# Patient Record
Sex: Male | Born: 1980
Health system: Southern US, Community
[De-identification: ages and names within clinical notes are randomized; demographics above are authoritative.]

## PROBLEM LIST (undated history)

## (undated) ENCOUNTER — Emergency Department: Admission: EM | Payer: Medicare Other

## (undated) DIAGNOSIS — B009 Herpesviral infection, unspecified: Secondary | ICD-10-CM

## (undated) DIAGNOSIS — Z789 Other specified health status: Secondary | ICD-10-CM

## (undated) DIAGNOSIS — F319 Bipolar disorder, unspecified: Secondary | ICD-10-CM

## (undated) DIAGNOSIS — R739 Hyperglycemia, unspecified: Secondary | ICD-10-CM

## (undated) DIAGNOSIS — E119 Type 2 diabetes mellitus without complications: Secondary | ICD-10-CM

## (undated) HISTORY — DX: Herpesviral infection, unspecified: B00.9

## (undated) HISTORY — DX: Type 2 diabetes mellitus without complications: E11.9

## (undated) HISTORY — DX: Hyperglycemia, unspecified: R73.9

## (undated) HISTORY — PX: INCISION AND DRAINAGE ABSCESS: SHX5864

## (undated) HISTORY — PX: LEG SURGERY: SHX1003

---

## 2006-04-09 ENCOUNTER — Ambulatory Visit: Payer: Self-pay

## 2011-05-23 LAB — COMPREHENSIVE METABOLIC PANEL
Albumin: 4.3 g/dL (ref 3.4–5.0)
Alkaline Phosphatase: 80 U/L (ref 50–136)
Anion Gap: 13 (ref 7–16)
BUN: 13 mg/dL (ref 7–18)
Bilirubin,Total: 0.5 mg/dL (ref 0.2–1.0)
Calcium, Total: 9.2 mg/dL (ref 8.5–10.1)
Creatinine: 1.36 mg/dL — ABNORMAL HIGH (ref 0.60–1.30)
EGFR (African American): >60
Glucose: 142 mg/dL — ABNORMAL HIGH (ref 65–99)
Osmolality: 286 (ref 275–301)
Potassium: 3.6 mmol/L (ref 3.5–5.1)
SGOT(AST): 27 U/L (ref 15–37)
Total Protein: 8.4 g/dL — ABNORMAL HIGH (ref 6.4–8.2)

## 2011-05-23 LAB — DRUG SCREEN, URINE
Amphetamines, Ur Screen: NEGATIVE (ref ?–1000)
Benzodiazepine, Ur Scrn: NEGATIVE (ref ?–200)
MDMA (Ecstasy)Ur Screen: NEGATIVE (ref ?–500)
Methadone, Ur Screen: NEGATIVE (ref ?–300)
Opiate, Ur Screen: NEGATIVE (ref ?–300)
Phencyclidine (PCP) Ur S: NEGATIVE (ref ?–25)
Tricyclic, Ur Screen: NEGATIVE (ref ?–1000)

## 2011-05-23 LAB — CBC
HCT: 47.1 % (ref 40.0–52.0)
MCHC: 34.5 g/dL (ref 32.0–36.0)
MCV: 92 fL (ref 80–100)
RBC: 5.13 10*6/uL (ref 4.40–5.90)
RDW: 13.4 % (ref 11.5–14.5)
WBC: 8.4 10*3/uL (ref 3.8–10.6)

## 2011-05-23 LAB — ACETAMINOPHEN LEVEL: Acetaminophen: <2 ug/mL

## 2011-05-23 LAB — ETHANOL
Ethanol %: <0.003 % (ref 0.000–0.080)
Ethanol: <3 mg/dL

## 2011-05-23 LAB — TSH: Thyroid Stimulating Horm: 2.61 u[IU]/mL

## 2011-05-24 ENCOUNTER — Inpatient Hospital Stay: Payer: Self-pay | Admitting: Psychiatry

## 2011-06-01 LAB — VALPROIC ACID LEVEL: Valproic Acid: 61 ug/mL

## 2011-06-20 LAB — CBC
HCT: 46.4 % (ref 40.0–52.0)
Platelet: 252 10*3/uL (ref 150–440)
RBC: 5.11 10*6/uL (ref 4.40–5.90)
RDW: 12.5 % (ref 11.5–14.5)
WBC: 8.6 10*3/uL (ref 3.8–10.6)

## 2011-06-20 LAB — DRUG SCREEN, URINE
Amphetamines, Ur Screen: NEGATIVE (ref ?–1000)
Barbiturates, Ur Screen: NEGATIVE (ref ?–200)
Benzodiazepine, Ur Scrn: NEGATIVE (ref ?–200)
Cannabinoid 50 Ng, Ur ~~LOC~~: POSITIVE (ref ?–50)
MDMA (Ecstasy)Ur Screen: NEGATIVE (ref ?–500)
Methadone, Ur Screen: NEGATIVE (ref ?–300)
Opiate, Ur Screen: NEGATIVE (ref ?–300)

## 2011-06-20 LAB — COMPREHENSIVE METABOLIC PANEL
Albumin: 4.1 g/dL (ref 3.4–5.0)
Anion Gap: 9 (ref 7–16)
BUN: 13 mg/dL (ref 7–18)
Calcium, Total: 8.8 mg/dL (ref 8.5–10.1)
Co2: 29 mmol/L (ref 21–32)
Creatinine: 1.46 mg/dL — ABNORMAL HIGH (ref 0.60–1.30)
Osmolality: 282 (ref 275–301)
Sodium: 139 mmol/L (ref 136–145)
Total Protein: 8.5 g/dL — ABNORMAL HIGH (ref 6.4–8.2)

## 2011-06-20 LAB — TSH: Thyroid Stimulating Horm: 1.64 u[IU]/mL

## 2011-06-20 LAB — ETHANOL: Ethanol: <3 mg/dL

## 2011-06-20 LAB — SALICYLATE LEVEL: Salicylates, Serum: 2.3 mg/dL

## 2011-06-21 LAB — VALPROIC ACID LEVEL: Valproic Acid: 21 ug/mL — ABNORMAL LOW

## 2011-06-26 ENCOUNTER — Inpatient Hospital Stay: Payer: Self-pay | Admitting: Psychiatry

## 2011-06-26 LAB — VALPROIC ACID LEVEL: Valproic Acid: 70 ug/mL

## 2011-06-30 LAB — LIPID PANEL
HDL Cholesterol: 26 mg/dL — ABNORMAL LOW (ref 40–60)
Ldl Cholesterol, Calc: 77 mg/dL (ref 0–100)
Triglycerides: 250 mg/dL — ABNORMAL HIGH (ref 0–200)
VLDL Cholesterol, Calc: 50 mg/dL — ABNORMAL HIGH (ref 5–40)

## 2011-09-17 ENCOUNTER — Emergency Department: Payer: Self-pay | Admitting: Emergency Medicine

## 2011-09-17 LAB — COMPREHENSIVE METABOLIC PANEL
Albumin: 4.3 g/dL (ref 3.4–5.0)
Calcium, Total: 9.2 mg/dL (ref 8.5–10.1)
Chloride: 106 mmol/L (ref 98–107)
Co2: 24 mmol/L (ref 21–32)
Creatinine: 1.61 mg/dL — ABNORMAL HIGH (ref 0.60–1.30)
EGFR (Non-African Amer.): 57 — ABNORMAL LOW
Glucose: 170 mg/dL — ABNORMAL HIGH (ref 65–99)
Potassium: 3.8 mmol/L (ref 3.5–5.1)
SGOT(AST): 28 U/L (ref 15–37)
Total Protein: 8.5 g/dL — ABNORMAL HIGH (ref 6.4–8.2)

## 2011-09-17 LAB — CBC
MCH: 30.9 pg (ref 26.0–34.0)
MCHC: 34.1 g/dL (ref 32.0–36.0)
MCV: 91 fL (ref 80–100)
Platelet: 251 10*3/uL (ref 150–440)
RBC: 5.31 10*6/uL (ref 4.40–5.90)
WBC: 9.4 10*3/uL (ref 3.8–10.6)

## 2011-09-17 LAB — ETHANOL: Ethanol %: <0.003 % (ref 0.000–0.080)

## 2011-09-18 LAB — URINALYSIS, COMPLETE
Bacteria: NONE SEEN
Bilirubin,UR: NEGATIVE
Blood: NEGATIVE
Leukocyte Esterase: NEGATIVE
Ph: 5 (ref 4.5–8.0)
RBC,UR: <1 /HPF (ref 0–5)
Specific Gravity: 1.031 (ref 1.003–1.030)

## 2011-09-18 LAB — DRUG SCREEN, URINE
Amphetamines, Ur Screen: NEGATIVE (ref ?–1000)
Barbiturates, Ur Screen: NEGATIVE (ref ?–200)
Benzodiazepine, Ur Scrn: NEGATIVE (ref ?–200)
Cannabinoid 50 Ng, Ur ~~LOC~~: POSITIVE (ref ?–50)
MDMA (Ecstasy)Ur Screen: NEGATIVE (ref ?–500)
Methadone, Ur Screen: NEGATIVE (ref ?–300)
Opiate, Ur Screen: NEGATIVE (ref ?–300)
Tricyclic, Ur Screen: NEGATIVE (ref ?–1000)

## 2011-09-18 LAB — CK
CK, Total: 881 U/L — ABNORMAL HIGH (ref 35–232)
CK, Total: 988 U/L — ABNORMAL HIGH (ref 35–232)

## 2011-09-18 LAB — BASIC METABOLIC PANEL
BUN: 16 mg/dL (ref 7–18)
Chloride: 104 mmol/L (ref 98–107)
Co2: 26 mmol/L (ref 21–32)
EGFR (African American): >60
Osmolality: 283 (ref 275–301)
Potassium: 3.9 mmol/L (ref 3.5–5.1)
Sodium: 138 mmol/L (ref 136–145)

## 2011-09-19 LAB — BASIC METABOLIC PANEL
Anion Gap: 8 (ref 7–16)
EGFR (Non-African Amer.): >60
Glucose: 170 mg/dL — ABNORMAL HIGH (ref 65–99)
Osmolality: 280 (ref 275–301)
Sodium: 138 mmol/L (ref 136–145)

## 2011-10-18 LAB — URINALYSIS, COMPLETE
Bacteria: NONE SEEN
Bilirubin,UR: NEGATIVE
Blood: NEGATIVE
Glucose,UR: 50 mg/dL (ref 0–75)
Leukocyte Esterase: NEGATIVE
Ph: 5 (ref 4.5–8.0)
Squamous Epithelial: <1
WBC UR: <1 /HPF (ref 0–5)

## 2011-10-18 LAB — DRUG SCREEN, URINE
Amphetamines, Ur Screen: NEGATIVE (ref ?–1000)
Barbiturates, Ur Screen: NEGATIVE (ref ?–200)
Benzodiazepine, Ur Scrn: NEGATIVE (ref ?–200)
Methadone, Ur Screen: NEGATIVE (ref ?–300)
Opiate, Ur Screen: NEGATIVE (ref ?–300)
Phencyclidine (PCP) Ur S: NEGATIVE (ref ?–25)
Tricyclic, Ur Screen: NEGATIVE (ref ?–1000)

## 2011-10-18 LAB — VALPROIC ACID LEVEL: Valproic Acid: <3 ug/mL — ABNORMAL LOW

## 2011-10-18 LAB — COMPREHENSIVE METABOLIC PANEL
Alkaline Phosphatase: 87 U/L (ref 50–136)
Anion Gap: 9 (ref 7–16)
BUN: 16 mg/dL (ref 7–18)
Bilirubin,Total: 0.9 mg/dL (ref 0.2–1.0)
Chloride: 104 mmol/L (ref 98–107)
Co2: 25 mmol/L (ref 21–32)
Creatinine: 1.37 mg/dL — ABNORMAL HIGH (ref 0.60–1.30)
EGFR (African American): >60
EGFR (Non-African Amer.): >60
Potassium: 3.6 mmol/L (ref 3.5–5.1)
SGOT(AST): 27 U/L (ref 15–37)
SGPT (ALT): 43 U/L
Total Protein: 8.1 g/dL (ref 6.4–8.2)

## 2011-10-18 LAB — CBC
HGB: 16.7 g/dL (ref 13.0–18.0)
MCV: 90 fL (ref 80–100)
Platelet: 237 10*3/uL (ref 150–440)
RBC: 5.22 10*6/uL (ref 4.40–5.90)
RDW: 13.6 % (ref 11.5–14.5)
WBC: 8.4 10*3/uL (ref 3.8–10.6)

## 2011-10-18 LAB — ETHANOL
Ethanol %: <0.003 % (ref 0.000–0.080)
Ethanol: <3 mg/dL

## 2011-10-18 LAB — SALICYLATE LEVEL: Salicylates, Serum: 2.3 mg/dL

## 2011-10-18 LAB — TSH: Thyroid Stimulating Horm: 2.4 u[IU]/mL

## 2011-10-18 LAB — ACETAMINOPHEN LEVEL: Acetaminophen: <2 ug/mL

## 2011-10-19 ENCOUNTER — Inpatient Hospital Stay: Payer: Self-pay | Admitting: Psychiatry

## 2011-10-19 LAB — LIPID PANEL
Cholesterol: 139 mg/dL (ref 0–200)
HDL Cholesterol: 29 mg/dL — ABNORMAL LOW (ref 40–60)
VLDL Cholesterol, Calc: 22 mg/dL (ref 5–40)

## 2011-10-19 LAB — BASIC METABOLIC PANEL
Anion Gap: 10 (ref 7–16)
BUN: 14 mg/dL (ref 7–18)
Chloride: 100 mmol/L (ref 98–107)
Co2: 27 mmol/L (ref 21–32)
Creatinine: 1.26 mg/dL (ref 0.60–1.30)
Osmolality: 278 (ref 275–301)
Potassium: 4.2 mmol/L (ref 3.5–5.1)
Sodium: 137 mmol/L (ref 136–145)

## 2011-10-19 LAB — FOLATE: Folic Acid: 18.3 ng/mL (ref 3.1–100.0)

## 2011-10-23 LAB — COMPREHENSIVE METABOLIC PANEL
Albumin: 3.5 g/dL (ref 3.4–5.0)
Anion Gap: 11 (ref 7–16)
Bilirubin,Total: 0.4 mg/dL (ref 0.2–1.0)
Calcium, Total: 9.3 mg/dL (ref 8.5–10.1)
Chloride: 100 mmol/L (ref 98–107)
Co2: 29 mmol/L (ref 21–32)
EGFR (African American): >60
Glucose: 130 mg/dL — ABNORMAL HIGH (ref 65–99)
Osmolality: 281 (ref 275–301)
Potassium: 4.4 mmol/L (ref 3.5–5.1)
SGOT(AST): 14 U/L — ABNORMAL LOW (ref 15–37)
Sodium: 140 mmol/L (ref 136–145)

## 2011-10-23 LAB — VALPROIC ACID LEVEL: Valproic Acid: 82 ug/mL

## 2012-04-14 ENCOUNTER — Inpatient Hospital Stay: Payer: Self-pay | Admitting: Surgery

## 2012-04-14 LAB — CBC
HCT: 45.3 % (ref 40.0–52.0)
MCH: 31.2 pg (ref 26.0–34.0)
MCV: 90 fL (ref 80–100)
Platelet: 271 10*3/uL (ref 150–440)
RBC: 5.02 10*6/uL (ref 4.40–5.90)
RDW: 12.9 % (ref 11.5–14.5)

## 2012-04-14 LAB — BASIC METABOLIC PANEL
Calcium, Total: 9.1 mg/dL (ref 8.5–10.1)
Co2: 26 mmol/L (ref 21–32)
Creatinine: 1.19 mg/dL (ref 0.60–1.30)
EGFR (African American): >60
EGFR (Non-African Amer.): >60
Glucose: 388 mg/dL — ABNORMAL HIGH (ref 65–99)
Potassium: 4.2 mmol/L (ref 3.5–5.1)

## 2012-07-15 ENCOUNTER — Emergency Department: Payer: Self-pay | Admitting: Emergency Medicine

## 2012-07-15 LAB — URINALYSIS, COMPLETE
Bilirubin,UR: NEGATIVE
Blood: NEGATIVE
Glucose,UR: >=500 mg/dL (ref 0–75)
Nitrite: NEGATIVE
Ph: 5 (ref 4.5–8.0)
Specific Gravity: 1.033 (ref 1.003–1.030)
Squamous Epithelial: <1

## 2012-07-15 LAB — COMPREHENSIVE METABOLIC PANEL
Albumin: 4.1 g/dL (ref 3.4–5.0)
Alkaline Phosphatase: 88 U/L (ref 50–136)
Anion Gap: 8 (ref 7–16)
Bilirubin,Total: 0.7 mg/dL (ref 0.2–1.0)
Chloride: 102 mmol/L (ref 98–107)
Co2: 25 mmol/L (ref 21–32)
Creatinine: 1.11 mg/dL (ref 0.60–1.30)
EGFR (African American): >60
EGFR (Non-African Amer.): >60
Glucose: 293 mg/dL — ABNORMAL HIGH (ref 65–99)
SGOT(AST): 27 U/L (ref 15–37)
Sodium: 135 mmol/L — ABNORMAL LOW (ref 136–145)
Total Protein: 8 g/dL (ref 6.4–8.2)

## 2012-07-15 LAB — DRUG SCREEN, URINE
Barbiturates, Ur Screen: NEGATIVE (ref ?–200)
Benzodiazepine, Ur Scrn: NEGATIVE (ref ?–200)
Cannabinoid 50 Ng, Ur ~~LOC~~: POSITIVE (ref ?–50)
Cocaine Metabolite,Ur ~~LOC~~: NEGATIVE (ref ?–300)
MDMA (Ecstasy)Ur Screen: NEGATIVE (ref ?–500)
Methadone, Ur Screen: NEGATIVE (ref ?–300)
Opiate, Ur Screen: NEGATIVE (ref ?–300)
Phencyclidine (PCP) Ur S: NEGATIVE (ref ?–25)

## 2012-07-15 LAB — CBC
HCT: 49.5 % (ref 40.0–52.0)
MCH: 30.9 pg (ref 26.0–34.0)
RBC: 5.52 10*6/uL (ref 4.40–5.90)
RDW: 13.6 % (ref 11.5–14.5)
WBC: 9.4 10*3/uL (ref 3.8–10.6)

## 2012-07-15 LAB — ETHANOL: Ethanol: <3 mg/dL

## 2012-07-15 LAB — TSH: Thyroid Stimulating Horm: 1.39 u[IU]/mL

## 2012-08-18 ENCOUNTER — Emergency Department: Payer: Self-pay | Admitting: Emergency Medicine

## 2012-08-18 LAB — URINALYSIS, COMPLETE
Bacteria: NONE SEEN
Bilirubin,UR: NEGATIVE
Blood: NEGATIVE
Glucose,UR: 150 mg/dL (ref 0–75)
Leukocyte Esterase: NEGATIVE
Ph: 5 (ref 4.5–8.0)
RBC,UR: <1 /HPF (ref 0–5)
Specific Gravity: 1.036 (ref 1.003–1.030)
WBC UR: 2 /HPF (ref 0–5)

## 2012-08-18 LAB — DRUG SCREEN, URINE
Amphetamines, Ur Screen: NEGATIVE (ref ?–1000)
Benzodiazepine, Ur Scrn: NEGATIVE (ref ?–200)
Cannabinoid 50 Ng, Ur ~~LOC~~: POSITIVE (ref ?–50)
MDMA (Ecstasy)Ur Screen: NEGATIVE (ref ?–500)
Opiate, Ur Screen: NEGATIVE (ref ?–300)

## 2012-08-18 LAB — CBC
MCH: 31 pg (ref 26.0–34.0)
MCHC: 35.2 g/dL (ref 32.0–36.0)
Platelet: 281 10*3/uL (ref 150–440)
RDW: 13 % (ref 11.5–14.5)

## 2012-08-18 LAB — COMPREHENSIVE METABOLIC PANEL
Albumin: 3.9 g/dL (ref 3.4–5.0)
Anion Gap: 11 (ref 7–16)
BUN: 17 mg/dL (ref 7–18)
Bilirubin,Total: 0.4 mg/dL (ref 0.2–1.0)
Calcium, Total: 8.7 mg/dL (ref 8.5–10.1)
Co2: 24 mmol/L (ref 21–32)
EGFR (African American): >60
EGFR (Non-African Amer.): >60
Glucose: 234 mg/dL — ABNORMAL HIGH (ref 65–99)
Osmolality: 285 (ref 275–301)
Potassium: 3.6 mmol/L (ref 3.5–5.1)
SGOT(AST): 21 U/L (ref 15–37)
SGPT (ALT): 24 U/L (ref 12–78)
Sodium: 138 mmol/L (ref 136–145)

## 2012-08-18 LAB — TSH: Thyroid Stimulating Horm: 2.97 u[IU]/mL

## 2012-08-18 LAB — SALICYLATE LEVEL: Salicylates, Serum: 3.8 mg/dL — ABNORMAL HIGH

## 2012-08-18 LAB — ACETAMINOPHEN LEVEL: Acetaminophen: <2 ug/mL

## 2012-08-19 LAB — ETHANOL: Ethanol: <3 mg/dL

## 2012-09-12 ENCOUNTER — Emergency Department: Payer: Self-pay | Admitting: Emergency Medicine

## 2012-09-12 LAB — COMPREHENSIVE METABOLIC PANEL
Albumin: 3.8 g/dL (ref 3.4–5.0)
Alkaline Phosphatase: 82 U/L (ref 50–136)
Anion Gap: 10 (ref 7–16)
BUN: 14 mg/dL (ref 7–18)
Bilirubin,Total: 0.6 mg/dL (ref 0.2–1.0)
Calcium, Total: 9 mg/dL (ref 8.5–10.1)
Chloride: 102 mmol/L (ref 98–107)
Co2: 26 mmol/L (ref 21–32)
Creatinine: 1.34 mg/dL — ABNORMAL HIGH (ref 0.60–1.30)
EGFR (African American): >60
EGFR (Non-African Amer.): >60
Glucose: 202 mg/dL — ABNORMAL HIGH (ref 65–99)
Osmolality: 282 (ref 275–301)
SGOT(AST): 11 U/L — ABNORMAL LOW (ref 15–37)
SGPT (ALT): 21 U/L (ref 12–78)
Sodium: 138 mmol/L (ref 136–145)
Total Protein: 7.6 g/dL (ref 6.4–8.2)

## 2012-09-12 LAB — CBC
HCT: 43.3 % (ref 40.0–52.0)
MCH: 30.9 pg (ref 26.0–34.0)
MCV: 87 fL (ref 80–100)
RBC: 4.96 10*6/uL (ref 4.40–5.90)
RDW: 13.2 % (ref 11.5–14.5)
WBC: 8.5 10*3/uL (ref 3.8–10.6)

## 2012-09-12 LAB — TSH: Thyroid Stimulating Horm: 1.85 u[IU]/mL

## 2012-09-12 LAB — ETHANOL: Ethanol %: <0.003 % (ref 0.000–0.080)

## 2012-11-20 ENCOUNTER — Emergency Department: Payer: Self-pay | Admitting: Emergency Medicine

## 2012-11-21 LAB — COMPREHENSIVE METABOLIC PANEL
Albumin: 3.8 g/dL (ref 3.4–5.0)
BUN: 15 mg/dL (ref 7–18)
Bilirubin,Total: 0.3 mg/dL (ref 0.2–1.0)
Chloride: 105 mmol/L (ref 98–107)
Co2: 25 mmol/L (ref 21–32)
EGFR (Non-African Amer.): >60
Glucose: 175 mg/dL — ABNORMAL HIGH (ref 65–99)
Potassium: 3.6 mmol/L (ref 3.5–5.1)
SGOT(AST): 91 U/L — ABNORMAL HIGH (ref 15–37)
SGPT (ALT): 48 U/L (ref 12–78)
Sodium: 138 mmol/L (ref 136–145)
Total Protein: 7.6 g/dL (ref 6.4–8.2)

## 2012-11-21 LAB — URINALYSIS, COMPLETE
Bacteria: NONE SEEN
Blood: NEGATIVE
Glucose,UR: NEGATIVE mg/dL (ref 0–75)
Ph: 5 (ref 4.5–8.0)
RBC,UR: <1 /HPF (ref 0–5)
Specific Gravity: 1.021 (ref 1.003–1.030)
Squamous Epithelial: 1
WBC UR: 1 /HPF (ref 0–5)

## 2012-11-21 LAB — CBC
HCT: 47.2 % (ref 40.0–52.0)
HGB: 16.5 g/dL (ref 13.0–18.0)
MCH: 31.3 pg (ref 26.0–34.0)
MCHC: 35 g/dL (ref 32.0–36.0)
MCV: 90 fL (ref 80–100)
RDW: 13.6 % (ref 11.5–14.5)
WBC: 8.7 10*3/uL (ref 3.8–10.6)

## 2012-11-21 LAB — DRUG SCREEN, URINE
Barbiturates, Ur Screen: NEGATIVE (ref ?–200)
Benzodiazepine, Ur Scrn: NEGATIVE (ref ?–200)
Cocaine Metabolite,Ur ~~LOC~~: NEGATIVE (ref ?–300)
MDMA (Ecstasy)Ur Screen: NEGATIVE (ref ?–500)
Methadone, Ur Screen: NEGATIVE (ref ?–300)
Opiate, Ur Screen: NEGATIVE (ref ?–300)
Phencyclidine (PCP) Ur S: NEGATIVE (ref ?–25)

## 2012-11-21 LAB — TSH: Thyroid Stimulating Horm: 2.62 u[IU]/mL

## 2012-11-21 LAB — ETHANOL
Ethanol %: <0.003 % (ref 0.000–0.080)
Ethanol: <3 mg/dL

## 2013-01-15 ENCOUNTER — Emergency Department: Payer: Self-pay | Admitting: Emergency Medicine

## 2013-01-15 LAB — CBC
HGB: 18.1 g/dL — ABNORMAL HIGH (ref 13.0–18.0)
Platelet: 295 10*3/uL (ref 150–440)
WBC: 9.3 10*3/uL (ref 3.8–10.6)

## 2013-01-15 LAB — COMPREHENSIVE METABOLIC PANEL
Albumin: 4.1 g/dL (ref 3.4–5.0)
BUN: 17 mg/dL (ref 7–18)
Bilirubin,Total: 0.3 mg/dL (ref 0.2–1.0)
Calcium, Total: 9.1 mg/dL (ref 8.5–10.1)
Creatinine: 1.22 mg/dL (ref 0.60–1.30)
EGFR (African American): >60
EGFR (Non-African Amer.): >60
Osmolality: 277 (ref 275–301)
Potassium: 3.8 mmol/L (ref 3.5–5.1)
SGPT (ALT): 34 U/L (ref 12–78)
Sodium: 137 mmol/L (ref 136–145)

## 2013-01-15 LAB — URINALYSIS, COMPLETE
Bacteria: NONE SEEN
Glucose,UR: >=500 mg/dL (ref 0–75)
Ketone: NEGATIVE
Nitrite: NEGATIVE
Ph: 5 (ref 4.5–8.0)
Protein: NEGATIVE
Squamous Epithelial: NONE SEEN
WBC UR: <1 /HPF (ref 0–5)

## 2013-01-15 LAB — TSH: Thyroid Stimulating Horm: 2.98 u[IU]/mL

## 2013-01-15 LAB — DRUG SCREEN, URINE
Barbiturates, Ur Screen: NEGATIVE (ref ?–200)
MDMA (Ecstasy)Ur Screen: NEGATIVE (ref ?–500)
Methadone, Ur Screen: NEGATIVE (ref ?–300)
Opiate, Ur Screen: NEGATIVE (ref ?–300)
Phencyclidine (PCP) Ur S: NEGATIVE (ref ?–25)

## 2014-07-12 NOTE — H&P (Signed)
  Darliss RidgelAARTI K KAPUR MD ELECTRONICALLY SIGNED 10/20/2011 15:36
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
## 2014-07-15 NOTE — Consult Note (Signed)
Brief Consult Note: Diagnosis: Schizoaffective Do, Bipolar Type.   Patient was seen by consultant.   Recommend further assessment or treatment.   Orders entered.   Comments: Pt seen in ED. He remains psychotic and was admitted due to non complaince and having conflict with his father. He was evalauted and case discussed with satff. His other reported that he did well on Abilify Maintena in the past. Will start him on the same.   Plan;  Abilify Maintena 400mg  IM x1 dose.  Will continue to monitor.      Plan;  Continue Abilify 15mg  po qam.  Lamictal 100mg  po BID Klonopin 1mg  po BID Add Lithium 300mg  po BID  Continue to monitor.  Electronic Signatures: Rhunette CroftFaheem, Abbott Jasinski S (MD)  (Signed 03-Jun-14 14:05)  Authored: Brief Consult Note   Last Updated: 03-Jun-14 14:05 by Rhunette CroftFaheem, Railyn House S (MD)

## 2014-07-15 NOTE — Consult Note (Signed)
Brief Consult Note: Diagnosis: Schizoaffective Do, Bipolar Type.   Patient was seen by consultant.   Recommend further assessment or treatment.   Orders entered.   Comments: Pt seen in ED. He appeared much calm and has been responding to the medications. he stated that he has seen his father 2 days ago and discussed with him about the discharge plan. He usually follows with Dr Omelia BlackwaterHeaden in [place] and takes his medication on a prn basis but since he has started taking his medications regularly, he is feeling much better. he is willing to go back with his family.  he denied any suicidal ideations or plans. SW spoke with his father who agreed to take the pt back home and will continue him on the medication.   DX- Schizoaffective DO.   Plan;  D/C IVC Continue Abilify 30mg  po qam.  Lamictal 100mg  po BID Klonopin 0.5 mg po BID.  Will be released with family in stable condition.  Electronic Signatures: Rhunette CroftFaheem, Aimie Wagman S (MD)  (Signed 29-Apr-14 15:29)  Authored: Brief Consult Note   Last Updated: 29-Apr-14 15:29 by Rhunette CroftFaheem, Trinka Keshishyan S (MD)
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
## 2014-07-15 NOTE — Consult Note (Signed)
Brief Consult Note: Diagnosis: Schizoaffective Do, Bipolar Type.   Patient was seen by consultant.   Recommend further assessment or treatment.   Orders entered.   Comments: Pt seen in ED. He remains agitated and was admitted due to non complaince and having conflict with his father. He was evaluated by Dr Toni Amendlapacs yesterday and his meds adjsuted. Pt did not cooperate much in the interview and stated that he came here on voluntary basis but then he was made involunatry. He stated that he is feeling OK now. He denied impulsive behavior. He usually follows with Dr Omelia [place]Headen in [place].  Reviewed Dr Omelia BlackwaterHeaden Notes from 07/24/12 and adjusted pt meds according to his prescriptons.      Plan;  Continue Abilify 15mg  po qam.  Lamictal 100mg  po BID Klonopin 1mg  po BID Add Lithium 300mg  po BID  Continue to monitor..  Electronic Signatures: Rhunette CroftFaheem, Tammra Pressman S (MD)  (Signed 29-May-14 10:31)  Authored: Brief Consult Note   Last Updated: 29-May-14 10:31 by Rhunette CroftFaheem, Keyari Kleeman S (MD)

## 2014-07-15 NOTE — Consult Note (Signed)
Brief Consult Note: Diagnosis: Schizoaffective Do, Bipolar Type.   Patient was seen by consultant.   Consult note dictated.   Recommend further assessment or treatment.   Orders entered.   Comments: Pt seen in ED [facility]. He was admitted due to conflict with his father and having some behavioral problems. he stated that he was taking his medications as prescribed and does not like taking the injections as it hurts his arm. However, he agreed to take the Abilify Maintena today so he can be discharged quickly. No acute aggression or mood symptoms noted today. denied Si/HI or plans.   PLAN: The patient is on IVC.   Will get  Abilify maintena injection of 400 mg soo, as it not available in pharmacy today. Continue depkaote ER 500mg  po TID.  Continue Clonzaepam 1mg  po TID.  D/C Temazepam.  D/C once stable.  Electronic Signatures: Rhunette CroftFaheem, Raine Elsass S (MD)  (Signed 02-Sep-14 12:56)  Authored: Brief Consult Note   Last Updated: 02-Sep-14 12:56 by Rhunette CroftFaheem, Tyden Kann S (MD)

## 2014-07-15 NOTE — Consult Note (Signed)
Brief Consult Note: Diagnosis: Schizoaffective Do, Bipolar Type.   Patient was seen by consultant.   Consult note dictated.   Recommend further assessment or treatment.   Orders entered.   Comments: [name] has a h/o mood instability and psychosis. He is a pt of Dr. Omelia Miranda. His father brought him to the ER when the patient became psychotic, grandiose and threatening. He has ben treated with a combination of Abilify maintena monthly injections and Lamicta. He stopped lamictal immediately afyet d.c from ER in June. Last Abilify injection 3 weeks ago. he is due for next one on 9/8.   PLAN: 1. The patient is on IVC.   2. We gave Abilify maintena injection of 400 mg today. We discontinued Lamictal that was restarted in error. We sterted Depakene for manic symptoms.   3. The father was unaware that the option of placement in a group home existed.   4. Psychiatry will follow up..  Electronic Signatures: Kristine LineaPucilowska, Dorian Renfro (MD)  (Signed 03-Sep-14 11:19)  Authored: Brief Consult Note   Last Updated: 03-Sep-14 11:19 by Kristine LineaPucilowska, Chiyeko Ferre (MD)

## 2014-07-15 NOTE — Op Note (Signed)
DATE OF BIRTH:  06/09/1980  DATE OF PROCEDURE:  04/14/2012  PREOPERATIVE DIAGNOSIS:  Perineal abscess.   POSTOPERATIVE DIAGNOSIS:  Perineal abscess.   PROCEDURE:  Incision and drainage of perineal abscess.   SURGEON:  Waylyn Tenbrink E. Excell Seltzerooper, MD   ASSISTANT:  Rudolpho Sevinaroline York, PA-S   ANESTHESIA:  General with LMA.   INDICATIONS:  This is a patient with a perineal abscess requiring incision and drainage. Preoperatively, we discussed the rationale for surgery, the options of observation, risks of bleeding, infection and recurrence and the need for an open wound. He understood and agreed to proceed.   FINDINGS:  Perineal abscess to the right of the perineum and perineal body. Cultures were taken.   DESCRIPTION OF PROCEDURE:  The patient was induced to general anesthesia, placed in a well-padded high lithotomy position, prepped and draped in sterile fashion. The area of highest fluctuance was examined and identified, and then an incision was made. Cultures were taken. The incision was enlarged to include 2 separate cavities. It was irrigated clear and then packed with 1-inch Xeroform gauze, and a sterile dressing was placed.   The patient tolerated this procedure well. There were no complications. He was taken to the recovery room in stable condition to be admitted for continued care.    ____________________________ Adah Salvageichard E. Excell Seltzerooper, MD rec:ms D: 04/14/2012 17:14:56 ET T: 04/14/2012 22:16:57 ET JOB#: [id number]  cc: Adah Salvageichard E. Excell Seltzerooper, MD, <Dictator> Lattie HawICHARD E Kyandra Mcclaine MD ELECTRONICALLY SIGNED 04/15/2012 14:35

## 2014-07-15 NOTE — Consult Note (Signed)
Details:   - Psychiatry: Followup consult note in the emergency room. This patient is a well known chronic psychiatric patient with bipolar disorder. He presented to the hospital psychotic and manic. Patient has a history of aggressive and dangerous behavior when manic in the past. At baseline she can be quite pleasant but when he is sick he can be very difficult to manage and his insight is questionable. Today in the patient is awake and alert and oriented. Made good eye contact. Psychomotor activity was normal. Speech was normal in rate and tone. Affect was a little bit sad and embarrassed. Mood is stated as being okay but wanting to leave. Thoughts appear to be generally lucid not obviously bazaar. He denied that he was having hallucinations. He denied suicidal or homicidal ideation. He has been compliant with medication.  Assessment: Patient with bipolar disorder who presented manic and agitated. Has a history of recurrent episodes of mania probably at least in part related to noncompliance. Although he is looking better right now his past history includes times when he would temporarily outlook good and then immediately relapse into symptoms. Judgment at this time as the patient needs longer-term treatment. Because of his history of aggression and difficulty in management the staff feel he is not appropriate for admission to our unit. I explained all this to the patient in the currently the [facility]. He was disappointed but understood and did not act out. We will continue current combination of medicines. Abilify, Lamictal and clonazepam.   Electronic Signatures: Fairley Copher, Jackquline DenmarkJohn T (MD)  (Signed 25-Apr-14 16:02)  Authored: Details   Last Updated: 25-Apr-14 16:02 by Audery Amellapacs, Kiante T (MD)

## 2014-07-15 NOTE — Discharge Summary (Signed)
PATIENT NAME:  Albert Miranda, Albert Miranda MR#:  [id number] DATE OF BIRTH:  07/23/1980  DATE OF ADMISSION:  04/14/2012 DATE OF DISCHARGE:  04/15/2012  DIAGNOSES: 1. Perineal abscess.  2. Bipolar disease.   PROCEDURE: I and D of perineal abscess.   HISTORY OF PRESENT ILLNESS AND HOSPITAL COURSE: This is a patient with progressive pain and tenderness in the right perineal area. Physical exam suggested perineal abscess. He was taken to the operating room where perineal abscess was confirmed and drained. Packing was removed the next day.   DISCHARGE INSTRUCTIONS: He is discharged in stable condition on analgesics and Keflex, to follow up in my office in 10 days, with instructions to shower and use a dry dressing as needed.  ____________________________ Adah Salvageichard E. Excell Seltzerooper, MD rec:cb D: 04/15/2012 14:50:09 ET T: 04/15/2012 16:11:40 ET JOB#: [id number]  cc: Adah Salvageichard E. Excell Seltzerooper, MD, <Dictator> Lattie HawICHARD E COOPER MD ELECTRONICALLY SIGNED 04/17/2012 18:45

## 2014-07-15 NOTE — Consult Note (Signed)
Brief Consult Note: Diagnosis: Schizoaffective Do, Bipolar Type.   Patient was seen by consultant.   Recommend further assessment or treatment.   Orders entered.   Comments: Pt seen in ED. He appeared very calm and has responded well to the medications. He stated that he is ready to be discharged and will be following Dr Omelia BlackwaterHeaden. He denied SI/HI  or plans.  Case discussed with staff and they agreed with discharged plan.   Plan;  Will d/c IVC.  Pt will be discharged in stable condition   Discharge meds; Lithium Carbonate 300mg  po BID Trazodone 100mg  po qhs Abilify Maintena 400 IM next due 09/24/12. Benztropine 1mg  po bid.  Clonazepam 1mg  po BID  Abilify 15mg  po qam.  Lamotrigine 100mg  po BID       Plan;  Continue Abilify 15mg  po qam.  Lamictal 100mg  po BID Klonopin 1mg  po BID Add Lithium 300mg  po BID.  Electronic Signatures: Rhunette CroftFaheem, Yurem Viner S (MD)  (Signed 10-Jun-14 10:27)  Authored: Brief Consult Note   Last Updated: 10-Jun-14 10:27 by Rhunette CroftFaheem, Ashlye Oviedo S (MD)

## 2014-07-15 NOTE — Consult Note (Signed)
Brief Consult Note: Diagnosis: Schizoaffective DO Bipolar Type.   Patient was seen by consultant.   Consult note dictated.   Recommend further assessment or treatment.   Orders entered.   Comments: Mr. Silverio DecampConnaly has a h/o mood instability and psychosis. He is a pt of Dr. Omelia BlackwaterHeaden. His father brought him to the ER when the patient became psychotic, grandiose and threatening. He has ben treated with a combination of Abilify maintena monthly injection, Clonazepam and Lamictal. The father believes that the patient has not been taking lamictal. The patient reports tha he has been compliant. Next Abilify injection on 9/8.   PLAN: 1. The patient no longer meets criteria for IVC. I will terminate proceedings. Please discharge as appropriate.   2. He is to continue all medications as prescribed by Dr. Omelia BlackwaterHeaden.  3. He will follow up with dr. Omelia BlackwaterHeaden on 9/8.   4. The father will pick him up.  Electronic Signatures: Kristine LineaPucilowska, Daniell Paradise (MD)  (Signed 03-Sep-14 11:18)  Authored: Brief Consult Note   Last Updated: 03-Sep-14 11:18 by Kristine LineaPucilowska, Vir Whetstine (MD)

## 2014-07-15 NOTE — H&P (Signed)
Subjective/Chief Complaint perirectal abscess    History of Present Illness 2d worsening pain no prior episode no f/c    Past History bipolar   Past Med/Surgical Hx:  Schizoaffective DO:   denies surg hx:   ALLERGIES:  No Known Allergies:   Family and Social History:   Family History Non-Contributory    Social History negative ETOH   Review of Systems:   Fever/Chills No    Cough No    Abdominal Pain No    Diarrhea No    Constipation No    Nausea/Vomiting No    Chest Pain No    Dysuria No    Tolerating Diet Yes   Physical Exam:   GEN no acute distress    HEENT pink conjunctivae    NECK supple    RESP normal resp effort  clear BS    CARD regular rate    ABD denies tenderness  soft    GU tender mass to rt perineum ant perirectal    EXTR negative edema    SKIN normal to palpation    PSYCH alert, A+O to time, place, person, good insight   Lab Results: Routine Chem:  21-Jan-14 08:38    Glucose, Serum  388   BUN 8   Creatinine (comp) 1.19   Sodium, Serum  135   Potassium, Serum 4.2   Chloride, Serum 101   CO2, Serum 26   Calcium (Total), Serum 9.1   Anion Gap 8   Osmolality (calc) 285   eGFR (African American) >60   eGFR (Non-African American) >60 (eGFR values <12mL/min/1.73 m2 may be an indication of chronic kidney disease (CKD). Calculated eGFR is useful in patients with stable renal function. The eGFR calculation will not be reliable in acutely ill patients when serum creatinine is changing rapidly. It is not useful in  patients on dialysis. The eGFR calculation may not be applicable to patients at the low and high extremes of body sizes, pregnant women, and vegetarians.)     Assessment/Admission Diagnosis perineal/perirectal abscess rec admit. IV abx I&D today, ate this am risks and options in detail   Electronic Signatures: Florene Glen (MD)  (Signed 21-Jan-14 09:24)  Authored: CHIEF COMPLAINT and HISTORY, PAST  MEDICAL/SURGIAL HISTORY, ALLERGIES, FAMILY AND SOCIAL HISTORY, REVIEW OF SYSTEMS, PHYSICAL EXAM, LABS, ASSESSMENT AND PLAN   Last Updated: 21-Jan-14 09:24 by Florene Glen (MD)

## 2014-07-15 NOTE — Consult Note (Signed)
Brief Consult Note: Diagnosis: schizophrenia.   Patient was seen by consultant.   Consult note dictated.   Orders entered.   Discussed with Attending MD.   Comments: Psychiatry: Patient seen. Chart reviewed. Patient had a dispute at Dr Lilia ProHeaden's office but was not violent. He does continue to have symptoms of schizophrenia. Currently calm and no threatening but loose. Family have concerns about safety. Plan for now to administer Abilify Maintaina 400mg  im and continue regular meds and to continue discussion with family before deciding on admission or not.Patient understands and voices consent for Abilify.  Electronic Signatures: Audery Amellapacs, Darreld T (MD)  (Signed 24-Oct-14 14:37)  Authored: Brief Consult Note   Last Updated: 24-Oct-14 14:37 by Audery Amellapacs, Genaro T (MD)
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
## 2014-07-15 NOTE — Consult Note (Signed)
PATIENT NAME:  Albert Miranda, Albert Miranda MR#:  [id number] DATE OF BIRTH:  1980/06/16  DATE OF CONSULTATION:  01/15/2013  CONSULTING PHYSICIAN:  Audery Amel, MD  IDENTIFY INFORMATION AND REASON FOR CONSULTATION: A 34 year old male with a history of schizophrenia or schizoaffective disorder who is brought to the Emergency Room under involuntary commitment after becoming agitated at his doctor's office. Consultation for appropriate psychiatric disposition.   HISTORY OF CURRENT ILLNESS: Information obtained from the patient and from his family on the paperwork. The patient went to his psychiatrist's office yesterday with the plan that he was going to get an injection of long-acting olanzapine. Apparently, he refused treatment and tried to leave. His mother was there at the appointment and had agreed to give him some money for gas if he took the shot. When he refused to take the shot, she refused to give him the money. He got agitated. She tried to reason with him. He tried to drive off. She blocked his car. Nobody was hurt but apparently there was quite a scene. He raised his voice with the staff at the clinic. Staff at the clinic actually filed the paperwork and law enforcement picked him up the next day.   The patient is not a wonderful historian. Says that he spends most of his time at home not doing much of anything. His thoughts seemed to stay disorganized and loose. He admits to only being partially cooperative with medication. He tends to minimize any of his other behavior problems though. He claims that he is not abusing any drugs recently.   PAST PSYCHIATRIC HISTORY: Long history of mental illness problems, multiple hospitalizations, multiple visits to the Emergency Room. His behavior in the past on our unit has made staff very hesitant to have him return to the inpatient psychiatry ward. Has a history of being verbally aggressive and very uncooperative and disruptive. He has responded to medication but  has a history of noncompliance. From what we are told, apparently recently, the Abilify long-acting injectable was no longer judged to be working and his doctor had wanted to switch him over to Zyprexa, but he is now saying he will refuse because it makes him too fat. No history of suicide attempts. Does have a history of some agitation and some aggression.   PAST MEDICAL HISTORY: The patient has no significant ongoing medical problems.   SOCIAL HISTORY: Lives with his father. Parents are divorced. Mother is a Financial trader. The patient does have a child but he is not involved in the care. He is not able to work. He does get disability. He has a very limited social life.   FAMILY HISTORY: Positive for mental illness.   SUBSTANCE ABUSE HISTORY: History of some substance abuse in the past. He minimizes it and denies that he has been abusing anything recently although when pressed, he does admit that he still smokes marijuana.   REVIEW OF SYSTEMS: Generally negative. Denies suicidal or homicidal ideation. Denies hallucinations. Denies acute pain. Denies any physical symptoms.   CURRENT MEDICATIONS: He was taking Abilify 30 mg a day until he got put on Abilify Maintena, recently has been on a Abilify Maintena 400 mg every 3 weeks and got a shot 3 weeks ago. Clonazepam 1 mg twice a day, Lamictal 200 mg twice a day.   ALLERGIES: No known drug allergies.   MENTAL STATUS EXAMINATION: Neatly groomed gentleman looks his stated age, only passively cooperative with the interview. Eye contact good. Psychomotor activity a little  bit fidgety and hyper. Mood is stated as fine. Affect: Gets irritable quite easily. Thoughts are a little bit loose and scattered, some tendency to jump around from topic to topic. No grossly bizarre statements made. No obvious grandiosity. Denies hallucinations. Denies suicidal or homicidal ideation. Judgment and insight impaired. Intelligence normal. Alert and oriented x4.    LABORATORY RESULTS: Drug screen positive for cannabis. TSH normal. Alcohol undetected. Elevated glucose 120. No other significant chemistry abnormalities. CBC shows an elevated hematocrit 52.6. That has been a chronic finding for him. Urinalysis positive for glucose; otherwise, unremarkable.   ASSESSMENT: The patient with schizoaffective disorder who is only partially controlled and continues to have a lot of problem behaviors that makes it very difficult for his family to live with him and has gotten him in trouble repeatedly. He is refusing changes in medicine and refusing treatment. Continues to be disruptive. At this point needs hospitalization again for stabilization.   TREATMENT PLAN: I wanted to give him the Abilify Maintena shot but unfortunately the hospital does not have it in stock. Therefore, I will continue him on 30 mg a day of oral Abilify and his usual other medications p.r.n. medicines available. I will continue to see if he meets  criteria for admission to the psychiatric unit.   DIAGNOSIS, PRINCIPAL AND PRIMARY:  AXIS I: Schizoaffective disorder, bipolar-type manic.   SECONDARY DIAGNOSES:  AXIS I: Cannabis abuse.  AXIS II: Deferred.  AXIS III: Elevated blood sugar, rule out diabetes, overweight.  AXIS IV: Severe from his chronic disability and stress.  AXIS V: Functioning at time of evaluation is 35.   ____________________________ Audery AmelJohn T. Agustine Rossitto, MD jtc:np D: 01/15/2013 17:21:53 ET T: 01/15/2013 20:06:54 ET JOB#: [id number]  cc: Audery AmelJohn T. Velmer Broadfoot, MD, <Dictator> Audery AmelJOHN T Hawley Pavia MD ELECTRONICALLY SIGNED 01/15/2013 22:39

## 2014-07-15 NOTE — Consult Note (Signed)
Brief Consult Note: Diagnosis: Schizoaffective Do, Bipolar Type.   Patient was seen by consultant.   Recommend further assessment or treatment.   Orders entered.   Comments: Pt seen in ED. He remains focused on being Discharged. Stated that no body has "evaluated him " since he came here. I asked him if he remebered talking to me on Thu but he does not recognize that. he stated that he wants to go to court and appeal his process. Pt stated that he came here as he was having some conflict with his father and now he wants to be discharged. Remains with poor insight. Appears psychotic and walking in the room.   DX- Schizoaffective DO.   Plan;  Continue Abilify 30mg  po qam.  Lamictal 100mg  po BID Klonopin 0.5 mg po BID.  [facility]  Electronic Signatures: Rhunette CroftFaheem, Quinzell Malcomb S (MD)  (Signed 26-Apr-14 12:39)  Authored: Brief Consult Note   Last Updated: 26-Apr-14 12:39 by Rhunette CroftFaheem, Maymunah Stegemann S (MD)
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
## 2014-07-17 NOTE — H&P (Signed)
PATIENT NAME:  Albert Miranda, Albert Miranda#:  [id number] DATE OF BIRTH:  [birth year]  DATE OF ADMISSION:  09/17/2011  ADDENDUM   SECOND ASSESSMENT 09/19/2011:   Miranda. Albert Miranda was placed back on Depakote and did show progressive improvement in his cooperation while in the Behavioral Medicine Emergency Department. He took 1,500 mg of Depakote on the 26th of June along with his evening Klonopin.   The next day the undersigned discussed with the patient how he had reason for rejecting Depakote. He stated that he had experience some hair loss with Depakote. The undersigned informed him that with mineral supplementation he could avoid hair loss saying supplementation has been shown to reduce and eliminate hair loss when given in combination with Depakote.   The undersigned provided medical education on the evening of the 26th along with medication education on the 27th. The patient showed appropriate social behavior, normal thought speech, alertness, normal mood as well as normal judgment. He was not experiencing any hallucinations, delusions, thoughts of harming himself or thoughts of harming others.   The undersigned discussed his case with the case manager and nurses of the Behavioral Medicine Emergency Department. They concurred that he had maintained normal thought process, judgment and insight. He was not disruptive or agitating the community in the BMED.    The undersigned discussed the patient's laboratory and temperature findings with the Emergency Department physician during the first shift on the 27th of June. The Emergency Department physician concurred that the patient's initial tachycardia, elevated temperature and elevated creatinine appeared to be secondary to an early neuroleptic malignant syndrome. The patient did not continue with any antipsychotic.   Upon further discussion of medication, the patient was most concerned about continuing to take an antipsychotic such as Abilify due to his weight  gain and the risk associated with it.   Miranda. Albert Miranda was given the option to change his Depakote to Tegretol avoiding any further risk of hair loss. Also, after Miranda. Albert Miranda was no longer committable with his mental status, the undersigned offered that he be admitted to the inpatient behavioral health unit during a mood stabilizer change to Tegretol.   However, Miranda. Albert Miranda declined and was no longer committable. He wanted to continue on the Depakote because it was working. He wanted to try mineral supplementation in order to see if it would prevent the hair loss. He wanted to get it at least one month and then discuss possible changes of mood stabilizer with his outpatient psychiatrist. He reiterated that he has no problem recognizing that he needs medication. He was able to see that whenever he goes off medication, he ends up back in the hospital. Retrospectively, he was able to recognize the re-emergence of symptoms.   Miranda. Albert Miranda was released from the Emergency Department with the recommendation to follow up with his general medical physician within one week of discharge given his basic metabolic panel findings regarding the creatinine. He had resolved his BUN and creatinine as well as his CK back down to a level that was consistent with his muscle mass, but his glucose had been elevated at 170. See the Behavioral Medicine progress note.   Other follow-up: Miranda. Albert Miranda is scheduled with Dr[facility] Omelia BlackwaterHeaden at [facility] for Monday, 09/23/2011, at 4:30. Miranda. Albert Miranda will also continue with his intake process with [facility].   DISCHARGE MEDICATIONS:  Depakote 500 mg 3 p.o. at bedtime.  Klonopin 1 mg, one p.o. b.i.d.   ____________________________ Adelene AmasJames S. Sascha Baugher, MD jsw:ap D: 09/20/2011  18:20:00 ET T: 09/21/2011 07:37:41 ET JOB#: [id number]  cc: Adelene Amas. Reuven Braver, MD, <Dictator> Lester Potosi MD ELECTRONICALLY SIGNED 09/25/2011 21:46
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
## 2014-07-17 NOTE — Consult Note (Signed)
Details:    - Psychiatry: PAtient seen. Presented with exacerbation of manic symptoms in the context of prob med non-complience after recent hospitalization. Initially was agitated but has been put back on medication and appears improved though still prone to mood lability and loose, psychotic thinking. Last admission he was agitated at a level that strained nursing ability, so [facility] The list is long. If he is improving without getting more agitated [facility]. Meanwhile supportive tharapy done and will check Depakote leval tomorrow. [facility].   Electronic Signatures: Audery Amellapacs, Sagar T (MD)  (Signed 01-Apr-13 22:41)  Authored: Details   Last Updated: 01-Apr-13 22:41 by Audery Amellapacs, Bocephus T (MD)
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
## 2014-07-17 NOTE — H&P (Signed)
PATIENT NAME:  Albert Miranda, Albert W MR#:  [id number] DATE OF BIRTH:  November 12, 1980  DATE OF ADMISSION:  06/26/2011  IDENTIFYING INFORMATION AND CHIEF COMPLAINT: This is a 34 year old man with a history of bipolar disorder who had recently been in the hospital after a manic episode. He returned to the hospital under involuntary commitment petition after becoming agitated and threatening and disorganized, confronting his girlfriend and appearing to be manic and threatening to the community.   CHIEF COMPLAINT: "It was nothing. I just wanted to find my son."   HISTORY OF PRESENT ILLNESS: Information is obtained from the patient, the chart, and the commitment paperwork. The patient reported that he had just gone over to his girlfriend's house to find his son and could not find the boy. He said he was trying to get some children outside to help him and did not think there was anything wrong with it. He was then picked up by the law and brought to the hospital. The commitment paperwork and collateral information suggest that he actually became very agitated, hostile, threatening, was frightening people in the neighborhood, clearly behaving in a manic way. The patient admitted that he had not been compliant with his medication. Also had been using marijuana since discharge. He denied that he was having auditory hallucinations at first, but later admitted that he had probably had some at times. He did show signs of paranoia. He had not been sleeping well.   PAST PSYCHIATRIC HISTORY: He has a long history of bipolar disorder which in the past had been marked primarily by depressive episodes.  A couple of months ago he developed a manic episode, probably from noncompliance with his maintenance treatment. He was brought into the hospital manic for his last admission and spent a fair bit of time in the hospital, finally getting better on a combination of Depakote and Seroquel. Prior to that he had taken Abilify and Lamictal as  primary medicines. He does not have a history of suicide attempts. Does not have a history of homicidal violence. He had gone for years without hospitalization.   SOCIAL HISTORY: Not married.  He does have a child by a woman from whom he is estranged. He is not able to work and has disability. He lives with either his mother or father, both of whom are supportive of him but are separate. His mother in particular is a professional in the mental health field and has been very supportive of him.   PAST MEDICAL HISTORY: The patient is overweight but otherwise has no significant ongoing medical problems.   SUBSTANCE ABUSE HISTORY: No history of really severe substance abuse but he has been using marijuana recently which has been worsening his symptoms.   FAMILY HISTORY: Positive for mental illness and bipolar disorder in a close relative.   REVIEW OF SYSTEMS: He denied any physical symptoms. He denied pain, denied nausea, denied constipation, denied difficulty breathing, denied any dizziness. He did admit that he was feeling a little tired. He said that his mood was bad. Denied hallucinations.   MENTAL STATUS EXAM: The patient is a well-built, somewhat heavy man who looks his stated age. Eye contact was normal. Psychomotor activity was fidgety and pacing. Voice was normal in rate and tone. Affect showed a little bit of lability and inappropriate giggling at times. No hostility. Mood was stated as good. Thoughts still occasionally were tangential, but no grossly bizarre statements were made. He did not appear to be obviously delusional. He denied hallucinations. Denied  suicidal or homicidal ideation. Judgment and insight a little bit improved over when he first came to the Emergency Room.   PHYSICAL EXAMINATION:  GENERAL: Heavy set man, good grooming, appears to be in good health. He does not look to be in any acute distress.   SKIN: No skin lesions identified.   MUSCULOSKELETAL: Full range of motion at  all extremities. Neck and back supple and nontender.   HEENT: Pupils equal and reactive. Face symmetric.   NEUROLOGICAL: Cranial nerves all intact. Strength is symmetric and normal throughout as are reflexes.   LUNGS: Clear to auscultation.   HEART: Regular rate and rhythm.   ABDOMEN: Soft, nontender, normal bowel sounds.   VITAL SIGNS: Temperature of 97.9, pulse 70, respirations 18, blood pressure 139/85.   MEDICATIONS: The patient had been on Depakote 1500 mg a day and Seroquel 400 mg at night when he was discharged from the hospital and these were restarted when he came into the Emergency Room.   ALLERGIES: No known drug allergies.   ASSESSMENT: 34 year old man with bipolar disorder who came back into the hospital once again manic. He stayed in the Emergency Room for several days after first presenting there because of his agitation and some of the difficulty we had working with him on the ward previously. He was compliant with medication and agreeable to treatment and not threatening. The wait for [facility] appeared to be quite long so I went ahead and admitted him to our psychiatry ward. The patient needs admission because of history of aggression, violence, threatening behavior, and thought disorder. He has been noncompliant with medication. High risk of relapse. He needs longer term stabilization.   TREATMENT PLAN: Admit to psychiatry. Continue Depakote and Seroquel. Consider possible switch to different medication as tolerated because the Seroquel is quite sedating for him. Engage him in groups and activities, try and keep him active. Monitor him for any aggressive or hostile behavior. Work on improving insight and judgment and education about illness. Continue to work on discharge plan. Still consider the possibility of [facility] if he is not improving.   DIAGNOSIS PRINCIPLE AND PRIMARY:  AXIS I: Bipolar disorder type I, recently manic.   SECONDARY  DIAGNOSES:  AXIS I: Marijuana abuse.   AXIS II: No diagnosis.   AXIS III: Overweight.   AXIS IV: Moderate chronic stress from burden of illness.     AXIS V: Functioning at time of admission: 30.    ____________________________ Audery Amel, MD jtc:bjt D: 06/28/2011 17:21:58 ET T: 06/28/2011 17:46:25 ET JOB#: [id number]  cc: Audery Amel, MD, <Dictator> Audery Amel MD ELECTRONICALLY SIGNED 06/29/2011 10:08
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
## 2015-05-28 ENCOUNTER — Emergency Department: Payer: Medicare Other

## 2015-05-28 ENCOUNTER — Encounter: Payer: Self-pay | Admitting: Emergency Medicine

## 2015-05-28 ENCOUNTER — Emergency Department
Admission: EM | Admit: 2015-05-28 | Discharge: 2015-05-28 | Disposition: A | Discharge status: Home / Self Care | Payer: Medicare Other | Attending: Student | Admitting: Student

## 2015-05-28 DIAGNOSIS — F419 Anxiety disorder, unspecified: Secondary | ICD-10-CM | POA: Diagnosis not present

## 2015-05-28 DIAGNOSIS — F29 Unspecified psychosis not due to a substance or known physiological condition: Secondary | ICD-10-CM | POA: Diagnosis not present

## 2015-05-28 DIAGNOSIS — R000 Tachycardia, unspecified: Secondary | ICD-10-CM | POA: Insufficient documentation

## 2015-05-28 DIAGNOSIS — F250 Schizoaffective disorder, bipolar type: Secondary | ICD-10-CM | POA: Diagnosis not present

## 2015-05-28 DIAGNOSIS — R451 Restlessness and agitation: Secondary | ICD-10-CM | POA: Diagnosis not present

## 2015-05-28 DIAGNOSIS — F1210 Cannabis abuse, uncomplicated: Secondary | ICD-10-CM | POA: Diagnosis not present

## 2015-05-28 DIAGNOSIS — R079 Chest pain, unspecified: Secondary | ICD-10-CM | POA: Diagnosis present

## 2015-05-28 HISTORY — DX: Other specified health status: Z78.9

## 2015-05-28 LAB — CBC WITH DIFFERENTIAL/PLATELET
Basophils Absolute: 0.1 10*3/uL (ref 0–0.1)
Basophils Relative: 1 %
EOS ABS: 0.1 10*3/uL (ref 0–0.7)
Eosinophils Relative: 2 %
HCT: 50.9 % (ref 40.0–52.0)
Hemoglobin: 17.6 g/dL (ref 13.0–18.0)
LYMPHS ABS: 3.3 10*3/uL (ref 1.0–3.6)
LYMPHS PCT: 43 %
MCH: 31 pg (ref 26.0–34.0)
MCHC: 34.6 g/dL (ref 32.0–36.0)
MCV: 89.7 fL (ref 80.0–100.0)
MONOS PCT: 7 %
Monocytes Absolute: 0.6 10*3/uL (ref 0.2–1.0)
NEUTROS ABS: 3.6 10*3/uL (ref 1.4–6.5)
NEUTROS PCT: 47 %
PLATELETS: 223 10*3/uL (ref 150–440)
RBC: 5.67 MIL/uL (ref 4.40–5.90)
RDW: 13.2 % (ref 11.5–14.5)
WBC: 7.7 10*3/uL (ref 3.8–10.6)

## 2015-05-28 LAB — URINE DRUG SCREEN, QUALITATIVE (ARMC ONLY)
Amphetamines, Ur Screen: NOT DETECTED
BARBITURATES, UR SCREEN: NOT DETECTED
BENZODIAZEPINE, UR SCRN: NOT DETECTED
COCAINE METABOLITE, UR ~~LOC~~: NOT DETECTED
Cannabinoid 50 Ng, Ur ~~LOC~~: POSITIVE — AB
MDMA (Ecstasy)Ur Screen: NOT DETECTED
METHADONE SCREEN, URINE: NOT DETECTED
OPIATE, UR SCREEN: NOT DETECTED
PHENCYCLIDINE (PCP) UR S: NOT DETECTED
Tricyclic, Ur Screen: NOT DETECTED

## 2015-05-28 LAB — BASIC METABOLIC PANEL
Anion gap: 9 (ref 5–15)
BUN: 15 mg/dL (ref 6–20)
CHLORIDE: 102 mmol/L (ref 101–111)
CO2: 27 mmol/L (ref 22–32)
Calcium: 9.2 mg/dL (ref 8.9–10.3)
Creatinine, Ser: 1.27 mg/dL — ABNORMAL HIGH (ref 0.61–1.24)
GFR calc Af Amer: >60 mL/min (ref >60)
GFR calc non Af Amer: >60 mL/min (ref >60)
Glucose, Bld: 252 mg/dL — ABNORMAL HIGH (ref 65–99)
Potassium: 3.8 mmol/L (ref 3.5–5.1)
SODIUM: 138 mmol/L (ref 135–145)

## 2015-05-28 LAB — SALICYLATE LEVEL: Salicylate Lvl: <4 mg/dL (ref 2.8–30.0)

## 2015-05-28 LAB — ACETAMINOPHEN LEVEL: Acetaminophen (Tylenol), Serum: <10 ug/mL — ABNORMAL LOW (ref 10–30)

## 2015-05-28 LAB — ETHANOL: Alcohol, Ethyl (B): <5 mg/dL (ref <5)

## 2015-05-28 LAB — TROPONIN I

## 2015-05-28 MED ORDER — NICOTINE 14 MG/24HR TD PT24
14.0000 mg | MEDICATED_PATCH | Freq: Every day | TRANSDERMAL | Status: DC
  Filled 2015-05-28: qty 1

## 2015-05-28 MED ORDER — NICOTINE 10 MG IN INHA: 1.0000 | RESPIRATORY_TRACT | Status: DC | PRN

## 2015-05-28 MED ORDER — LORAZEPAM 1 MG PO TABS
1.0000 mg | ORAL_TABLET | Freq: Once | ORAL | Status: AC
  Administered 2015-05-28: 1 mg via ORAL
  Filled 2015-05-28: qty 1

## 2015-05-28 MED ORDER — ONDANSETRON 4 MG PO TBDP
4.0000 mg | ORAL_TABLET | Freq: Once | ORAL | Status: AC
  Administered 2015-05-28: 4 mg via ORAL

## 2015-05-28 MED ORDER — OLANZAPINE 5 MG PO TABS
5.0000 mg | ORAL_TABLET | Freq: Once | ORAL | Status: AC
  Administered 2015-05-28: 5 mg via ORAL
  Filled 2015-05-28: qty 1

## 2015-05-28 MED ORDER — ONDANSETRON 4 MG PO TBDP
ORAL_TABLET | ORAL | Status: AC
  Administered 2015-05-28: 4 mg via ORAL
  Filled 2015-05-28: qty 1

## 2015-05-28 MED ORDER — NICOTINE POLACRILEX 2 MG MT GUM
2.0000 mg | CHEWING_GUM | OROMUCOSAL | Status: DC | PRN
  Administered 2015-05-28: 2 mg via ORAL
  Filled 2015-05-28: qty 1

## 2015-05-28 NOTE — ED Provider Notes (Signed)
Specialist on-call psychiatry is recommending discharge of the patient, he is not exhibiting any psychotic symptoms and appears to be at baseline  Albert Everyobert Desta Bujak, MD 05/28/15 [phone]

## 2015-05-28 NOTE — ED Notes (Signed)
Visitor has left ED. Pt remains in no acute distress.

## 2015-05-28 NOTE — ED Notes (Signed)
Telepsych on computer talking with pt at this time
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
## 2015-05-28 NOTE — ED Notes (Signed)
Pt states "i died last night and then woke up this morning.  i am lucky.  i need to be seen. i did this before at 26 too".  C/o pain across entire left chest.  Asked pt how he knows he died and reports he was in bed dreaming and then woke up in bed.  Denies any other sx at this time. Denies drug or alcohol use last night.

## 2015-05-28 NOTE — ED Notes (Signed)
Visitor arrived for pt. Visitor wanded by security and belongings left outside of room.

## 2015-05-28 NOTE — ED Notes (Signed)
Pt dressed out by [place], pt standing in door way, continuing to try and walk out of room

## 2015-05-28 NOTE — ED Notes (Signed)
Pt had SOC and IVC was rescinded - Dr Cyril LoosenKinner released this patient at this time - Pt dressed in personal clothing and ambulated to lobby for transport to home

## 2015-05-28 NOTE — ED Notes (Signed)
Pt getting very agitated in triage, pacing and concern for pt getting violent. Not in correct mind. Charge rn called and requested bed. No VS done. Primary RN notified of need for VS yet

## 2015-05-28 NOTE — ED Notes (Signed)
Telephsyc computer being set up at this time by RN Kara Mead([employer])

## 2015-05-28 NOTE — ED Notes (Signed)
Pt moved from room 9 to room 21. Pt is stating he died last night and met Jesus. Pt has visual and auditory hallucinations. Report given to [employer].

## 2015-05-28 NOTE — ED Provider Notes (Signed)
[facility] Emergency Department Provider Note  ____________________________________________  Time seen: Approximately 9:37 AM  I have reviewed the triage vital signs and the nursing notes.   HISTORY  Chief Complaint Chest Pain   Caveat-history of present illness and review of systems Limited due to the patient's severe agitation, flight of ideas, loose associations.  HPI Albert Miranda is a 35 y.o. male with history of schizoaffective disorder who presents to the emergency department with obvious psychosis and agitation. The patient drove up to the hospital, jumped out of his vehicle and stated to the triage nurse "I need help... I died last night and I talked to Jesus about how to get back to earth and now I'm back... But I'm Jay-Z and I need to tell him to jump". He also complained to the triage nurse of some chest pain however he has not giving me that history, not answering any questions directly. He is unable to give any additional history.   Past Medical History  Diagnosis Date  . Patient denies medical problems     There are no active problems to display for this patient.   No past surgical history on file.  No current outpatient prescriptions on file.  Allergies Review of patient's allergies indicates no known allergies.  No family history on file.  Social History Social History  Substance Use Topics  . Smoking status: Not on file  . Smokeless tobacco: Not on file  . Alcohol Use: Not on file    Review of Systems  Caveat-history of present illness and review of systems Limited due to the patient's severe agitation, flight of ideas, loose associations. ____________________________________________   PHYSICAL EXAMCeasar Mons:  Filed Vitals:   05/28/15 0944 05/28/15 1208 05/28/15 1315  BP: 166/93  144/83  Pulse: 117  88  Temp:  97.8 F (36.6 C)   TempSrc:  Oral   Resp: 18  16  Height:   5\' 11"  (1.803 m)  SpO2: 98%  96%       Constitutional: Alert, pacing the room with psychomotor agitation. Appears to be responding to internal stimuli.  Eyes: Conjunctivae are normal. PERRL. EOMI. Head: Atraumatic. Nose: No congestion/rhinnorhea. Mouth/Throat: Mucous membranes are moist.  Oropharynx non-erythematoormal rate, regular rhythm. Grossly normal heart sounds.  Good peripheral circulation. Respiratory: Normal respiratory effort.  No retractions. Lungs CTAB. Cardiovascular: Tachycardic rate, regular rhythm, no murmurs, rubs, gallops. Gastrointestinal: Soft and nontender. No distention. No abdominal bruits. No CVA tenderness. Genitourinary: deferred Musculoskeletal: No lower extremity tenderness nor edema.  No joint effusions. Neurologic:  Normal speech and language. No gross focal neurologic deficits are appreciated. No gait instability. Skin:  Skin is warm, dry and intact. No rash noted. Psychiatric: Mood is agitated and anxious. Patient appears to be responding to internal stimuli on exam, tangential thought process, loose associations.  ____________________________________________   LABS (all labs ordered are listed, but only abnormal results are displayed)  Labs Reviewed  BASIC METABOLIC PANEL - Abnormal; Notable for the following:    Glucose, Bld 252 (*)    Creatinine, Ser 1.27 (*)    All other components within normal limits  URINE DRUG SCREEN, QUALITATIVE ([facility] ONLY) - Abnormal; Notable for the following:    Cannabinoid 50 Ng, Ur [place] POSITIVE (*)    All other components within normal limits  ACETAMINOPHEN LEVEL - Abnormal; Notable for the following:    Acetaminophen (Tylenol), Serum <10 (*)    All other components within normal limits  CBC WITH DIFFERENTIAL/PLATELET  ETHANOL  SALICYLATE LEVEL  TROPONIN I   ____________________________________________  EKG  ED ECG REPORT I, Gayla Doss, the attending physician, personally viewed and interpreted this ECG.   Date: 05/28/2015  EKG Time:  09:17  Rate: 114  Rhythm: sinus tachycardia  Axis: normal  Intervals:none  ST&T Change: No acute ST elevation. Isolated Q-wave in V3 is nonspecific.  ____________________________________________  RADIOLOGY  CXR IMPRESSION: No acute cardiopulmonary disease. ____________________________________________   PROCEDURES  Procedure(s) performed: None  Critical Care performed: No  ____________________________________________   INITIAL IMPRESSION / ASSESSMENT AND PLAN / ED COURSE  Pertinent labs & imaging results that were available during my care of the patient were reviewed by me and considered in my medical decision making (see chart for details).  Albert Miranda is a 35 y.o. male with history of schizoaffective disorder who presents to the emergency department with obvious psychosis and agitation. On exam, he is agitated, somewhat threatening but is able to de-escalate with encouragement. He is responding to internal stimuli and appears floridly psychotic. Will place involuntary commitment, obtain screening labs, consult psychiatry as well as behavioral health and anticipate admission. He reports a history of nurse that he was having some chest pain but is not complaining to me of chest pain, does not elaborate on any chest pain that he may have had. We'll obtain screening labs, EKG and chest x-ray.    ----------------------------------------- 2:33 PM on 05/28/2015 ----------------------------------------- Labs reviewed. CBC unremarkable. BMP with mild creatinine elevation 1.27. Undetectable ethanol, salicylate, acetaminophen levels. Negative troponin, chest x-ray clear, EKG not consistent with ischemia, doubt ACS. Still awaiting urine drug screen. Tachycardia resolved. The patient is medically cleared.  ____________________________________________   FINAL CLINICAL IMPRESSION(S) / ED DIAGNOSES  Final diagnoses:  Schizoaffective disorder, bipolar type (HCC)  Psychosis,  unspecified psychosis type      Gayla Doss, MD 05/28/15 1630

## 2015-05-28 NOTE — ED Notes (Signed)
Pt was notified that we needed a urine sample for his urine drug screen - Pt was given specimen cup and escorted to the bathroom - Pt opened door and cup was empty - When asked what happened to the urine sample he stated "I forgot to pee in the cup" - Pt had ripped his pants - New pair of pants given and a urinal was given - Explained to pt to void in the urinal for urine sample the next time he had to void

## 2015-05-28 NOTE — Progress Notes (Signed)
[name] has consulted with pts nurse as notes indicate that the pt is violent and uncooperative with staff at the moment . Pts nurse has informed [name] that the pt has been recently medicated. [name] will return to attempt assessment.   05/28/2015 Albert FlashNicole Armanie Martine, MS, NCC, LPCA Therapeutic Triage Specialist

## 2015-05-28 NOTE — Discharge Instructions (Signed)
Schizoaffective Disorder  Schizoaffective disorder (ScAD) is a mental illness. It causes symptoms that are a mixture of schizophrenia (a psychotic disorder) and an affective (mood) disorder. The schizophrenic symptoms may include delusions, hallucinations, or odd behavior. The mood symptoms may be similar to major depression or bipolar disorder. ScAD may interfere with personal relationships or normal daily activities. People with ScAD are at increased risk for job loss, social isolation, physical health problems, anxiety and substance use disorders, and suicide.  ScAD usually occurs in cycles. Periods of severe symptoms are followed by periods of  less severe symptoms or improvement. The illness affects men and women equally but usually appears at an earlier age (teenage or early adult years) in men. People who have family members with schizophrenia, bipolar disorder, or ScAD are at higher risk of developing ScAD.  SYMPTOMS   At any one time, people with ScAD may have psychotic symptoms only or both psychotic and mood symptoms. The psychotic symptoms include one or more of the following:  · Hearing, seeing, or feeling things that are not there (hallucinations).    · Having fixed, false beliefs (delusions). The delusions usually are of being attacked, harassed, cheated, persecuted, or conspired against (paranoid delusions).  · Speaking in a way that makes no sense to others (disorganized speech).  The psychotic symptoms of ScAD may also include confusing or odd behavior or any of the negative symptoms of schizophrenia. These include loss of motivation for normal daily activities, such as bathing or grooming, withdrawal from other people, and lack of emotions.     The mood symptoms of ScAD occur more often than not. They resemble major depressive disorder or bipolar mania. Symptoms of major depression include depressed mood and four or more of the following:  · Loss of interest in usually pleasurable activities  (anhedonia).  · Sleeping more or less than normal.  · Feeling worthless or excessively guilty.  · Lack of energy or motivation.  · Trouble concentrating.  · Eating more or less than usual.  · Thinking a lot about death or suicide.  Symptoms of bipolar mania include abnormally elevated or irritable mood and increased energy or activity, plus three or more of the following:    · More confidence than normal or feeling that you are able to do anything (grandiosity).  · Feeling rested with less sleep than normal.    · Being easily distracted.    · Talking more than usual or feeling pressured to keep talking.    · Feeling that your thoughts are racing.  · Engaging in high-risk activities such as buying sprees or foolish business decisions.  DIAGNOSIS   ScAD is diagnosed through an assessment by your health care provider. Your health care provider will observe and ask questions about your thoughts, behavior, mood, and ability to function in daily life. Your health care provider may also ask questions about your medical history and use of drugs, including prescription medicines. Your health care provider may also order blood tests and imaging exams. Certain medical conditions and substances can cause symptoms that resemble ScAD. Your health care provider may refer you to a mental health specialist for evaluation.   ScAD is divided into two types. The depressive type is diagnosed if your mood symptoms are limited to major depression. The bipolar type is diagnosed if your mood symptoms are manic or a mixture of manic and depressive symptoms  TREATMENT   ScAD is usually a lifelong illness. Long-term treatment is necessary. The following treatments are available:  · Medicine. Different types of   medicine are used to treat ScAD. The exact combination depends on the type and severity of your symptoms. Antipsychotic medicine is used to control psychotic symptoms such as delusions, paranoia, and hallucinations. Mood stabilizers can  even the highs and lows of bipolar manic mood swings. Antidepressant medicines are used to treat major depressive symptoms.  · Counseling or talk therapy. Individual, group, or family counseling may be helpful in providing education, support, and guidance. Many people with ScAD also benefit from social skills and job skills (vocational) training.  A combination of medicine and counseling is usually best for managing the disorder over time. A procedure in which electricity is applied to the brain through the scalp (electroconvulsive therapy) may be used to treat people with severe manic symptoms that do not respond to medicine and counseling.  HOME CARE INSTRUCTIONS   · Take all your medicine as prescribed.  · Check with your health care provider before starting new prescription or over-the-counter medicines.  · Keep all follow up appointments with your health care provider.  SEEK MEDICAL CARE IF:   · If you are not able to take your medicines as prescribed.  · If your symptoms get worse.  SEEK IMMEDIATE MEDICAL CARE IF:   · You have serious thoughts about hurting yourself or others.     This information is not intended to replace advice given to you by your health care provider. Make sure you discuss any questions you have with your health care provider.     Document Released: 07/22/2006 Document Revised: 04/01/2014 Document Reviewed: 10/23/2012  Elsevier Interactive Patient Education ©2016 [employer]

## 2015-05-28 NOTE — ED Notes (Addendum)
Pt reports he is nauseous and having epigastric pain at this time. Pt also verbalized that his mouth "feels weird" pt is verbalizing that he is a smoker and he believes his symptoms are from this. Pt also adds in that he feels off because his head "does not feel secure." pt is sitting on edge f the bed. Vitals checked and are stable. Pt appears to be in no acute distress. MD made aware. Pt is alert and oriented x 4.

## 2015-05-28 NOTE — ED Notes (Signed)
Unable to complete assessment at this time, pt agitated and standing at doorway, officer at pts side

## 2015-05-28 NOTE — ED Notes (Signed)
This tech give pt belongings back per RN Aurther Loft(Terry) PT getting dressed at this time and ready for discharge

## 2015-05-28 NOTE — ED Notes (Signed)
Patient talked with SOC. Per patient, states he can go home. I have explained to patient that [facility] need [facility] speak with MD before he can be released.

## 2015-05-28 NOTE — ED Notes (Signed)
Pt brought to room 21, pt very agitated, shirtless, pacing, blood drawn

## 2016-04-30 ENCOUNTER — Emergency Department
Admission: EM | Admit: 2016-04-30 | Discharge: 2016-05-02 | Disposition: A | Discharge status: Other Acute Inpatient Hospital | Payer: Medicare Other | Attending: Emergency Medicine | Admitting: Emergency Medicine

## 2016-04-30 ENCOUNTER — Encounter: Payer: Self-pay | Admitting: *Deleted

## 2016-04-30 DIAGNOSIS — Z046 Encounter for general psychiatric examination, requested by authority: Secondary | ICD-10-CM | POA: Diagnosis present

## 2016-04-30 DIAGNOSIS — F259 Schizoaffective disorder, unspecified: Secondary | ICD-10-CM

## 2016-04-30 DIAGNOSIS — F17210 Nicotine dependence, cigarettes, uncomplicated: Secondary | ICD-10-CM | POA: Diagnosis not present

## 2016-04-30 DIAGNOSIS — F250 Schizoaffective disorder, bipolar type: Secondary | ICD-10-CM | POA: Diagnosis not present

## 2016-04-30 DIAGNOSIS — Z9119 Patient's noncompliance with other medical treatment and regimen: Secondary | ICD-10-CM

## 2016-04-30 DIAGNOSIS — Z91199 Patient's noncompliance with other medical treatment and regimen due to unspecified reason: Secondary | ICD-10-CM

## 2016-04-30 DIAGNOSIS — Z79899 Other long term (current) drug therapy: Secondary | ICD-10-CM | POA: Insufficient documentation

## 2016-04-30 DIAGNOSIS — F29 Unspecified psychosis not due to a substance or known physiological condition: Secondary | ICD-10-CM | POA: Diagnosis not present

## 2016-04-30 DIAGNOSIS — F1210 Cannabis abuse, uncomplicated: Secondary | ICD-10-CM

## 2016-04-30 LAB — URINE DRUG SCREEN, QUALITATIVE (ARMC ONLY)
Amphetamines, Ur Screen: NOT DETECTED
BARBITURATES, UR SCREEN: NOT DETECTED
Benzodiazepine, Ur Scrn: NOT DETECTED
CANNABINOID 50 NG, UR ~~LOC~~: NOT DETECTED
COCAINE METABOLITE, UR ~~LOC~~: NOT DETECTED
MDMA (ECSTASY) UR SCREEN: NOT DETECTED
Methadone Scn, Ur: NOT DETECTED
Opiate, Ur Screen: NOT DETECTED
PHENCYCLIDINE (PCP) UR S: NOT DETECTED
Tricyclic, Ur Screen: NOT DETECTED

## 2016-04-30 LAB — COMPREHENSIVE METABOLIC PANEL
ALK PHOS: 73 U/L (ref 38–126)
ALT: 25 U/L (ref 17–63)
AST: 39 U/L (ref 15–41)
Albumin: 4.4 g/dL (ref 3.5–5.0)
Anion gap: 11 (ref 5–15)
BILIRUBIN TOTAL: 0.9 mg/dL (ref 0.3–1.2)
BUN: 21 mg/dL — AB (ref 6–20)
CALCIUM: 9.4 mg/dL (ref 8.9–10.3)
CHLORIDE: 104 mmol/L (ref 101–111)
CO2: 26 mmol/L (ref 22–32)
CREATININE: 1.34 mg/dL — AB (ref 0.61–1.24)
Glucose, Bld: 160 mg/dL — ABNORMAL HIGH (ref 65–99)
Potassium: 4 mmol/L (ref 3.5–5.1)
Sodium: 141 mmol/L (ref 135–145)
Total Protein: 7.9 g/dL (ref 6.5–8.1)

## 2016-04-30 LAB — CBC
HCT: 43.8 % (ref 40.0–52.0)
Hemoglobin: 15.3 g/dL (ref 13.0–18.0)
MCH: 30.6 pg (ref 26.0–34.0)
MCHC: 35 g/dL (ref 32.0–36.0)
MCV: 87.4 fL (ref 80.0–100.0)
PLATELETS: 282 10*3/uL (ref 150–440)
RBC: 5.01 MIL/uL (ref 4.40–5.90)
RDW: 13 % (ref 11.5–14.5)
WBC: 11.3 10*3/uL — ABNORMAL HIGH (ref 3.8–10.6)

## 2016-04-30 LAB — ETHANOL

## 2016-04-30 LAB — SALICYLATE LEVEL

## 2016-04-30 LAB — ACETAMINOPHEN LEVEL: Acetaminophen (Tylenol), Serum: <10 ug/mL — ABNORMAL LOW (ref 10–30)

## 2016-04-30 MED ORDER — LORAZEPAM 1 MG PO TABS
1.0000 mg | ORAL_TABLET | Freq: Once | ORAL | Status: AC
Start: 2016-04-30 — End: 2016-04-30
  Administered 2016-04-30: 1 mg via ORAL
  Filled 2016-04-30: qty 1

## 2016-04-30 NOTE — ED Notes (Signed)
BEHAVIORAL HEALTH ROUNDING Patient sleeping: No. Patient alert and oriented: alert - oriented to self and place  Behavior appropriate: Yes.  ; If no, describe:  Nutrition and fluids offered: yes Toileting and hygiene offered: Yes  Sitter present: q15 minute observations and security monitoring Law enforcement present: Yes  ODS

## 2016-04-30 NOTE — ED Notes (Signed)

## 2016-04-30 NOTE — ED Provider Notes (Signed)
[facility] Emergency Department Provider Note   ____________________________________________   First MD Initiated Contact with Patient 04/30/16 1652     (approximate)  I have reviewed the triage vital signs and the nursing notes.   HISTORY  Chief Complaint Mental Health Problem   HPI Albert Miranda is a 36 y.o. male recently released from jail who says he is hearing voices that told him to come to the hospital. He is denying any suicidal or homicidal ideation. He does not remember when he was released from jail. Does not remember the last time he took medications. However, he is able to state that he is in the hospital. He only answers some of my questions when asked.Does not report any pain.   Past Medical History:  Diagnosis Date  . Patient denies medical problems     There are no active problems to display for this patient.   No past surgical history on file.  Prior to Admission medications   Not on File    Allergies Patient has no known allergies.  No family history on file.  Social History Social History  Substance Use Topics  . Smoking status: Current Every Day Smoker    Packs/day: 1.00    Types: Cigarettes  . Smokeless tobacco: Never Used  . Alcohol use No    Review of Systems Constitutional: No fever/chills Eyes: No visual changes. ENT: No sore throat. Cardiovascular: Denies chest pain. Respiratory: Denies shortness of breath. Gastrointestinal: No abdominal pain.  No nausea, no vomiting.  No diarrhea.  No constipation. Genitourinary: Negative for dysuria. Musculoskeletal: Negative for back pain. Skin: Negative for rash. Neurological: Negative for headaches, focal weakness or numbness.  10-point ROS otherwise negative.  ____________________________________________   PHYSICAL EXAM:  VITAL SIGNS: ED Triage Vitals  Enc Vitals Group     BP 04/30/16 1648 131/84     Pulse Rate 04/30/16 1648 (!) 109     Resp  04/30/16 1648 16     Temp 04/30/16 1648 98.7 F (37.1 C)     Temp Source 04/30/16 1648 Oral     SpO2 04/30/16 1648 96 %     Weight 04/30/16 1641 215 lb (97.5 kg)     Height 04/30/16 1641 5\' 11"  (1.803 m)     Head Circumference --      Peak Flow --      Pain Score --      Pain Loc --      Pain Edu? --      Excl. in GC? --     Constitutional: Alert and oriented. in no acute distress. Eyes: Conjunctivae are normal. PERRL. EOMI. Head: Atraumatic. Nose: No congestion/rhinnorhea. Mouth/Throat: Mucous membranes are moist.   Neck: No stridor.   Cardiovascular: Normal rate, regular rhythm. Grossly normal heart sounds.   Respiratory: Normal respiratory effort.  No retractions. Lungs CTAB. Gastrointestinal: Soft and nontender. No distention. Musculoskeletal: No lower extremity tenderness nor edema.  No joint effusions. Neurologic:  Normal speech and language. No gross focal neurologic deficits are appreciated.  Skin:  Skin is warm, dry and intact. No rash noted. Psychiatric: Flat affect. Only answering some of my questions when asked and appears to either be omitting or forgetting information that should be readily available such as how long he has been out of jail. Does not state if he has been having difficulty sleeping at this time. Poor eye contact.  ____________________________________________   LABS (all labs ordered are listed, but only abnormal results are  displayed)  Labs Reviewed  CBC - Abnormal; Notable for the following:       Result Value   WBC 11.3 (*)    All other components within normal limits  COMPREHENSIVE METABOLIC PANEL  ETHANOL  SALICYLATE LEVEL  ACETAMINOPHEN LEVEL  URINE DRUG SCREEN, QUALITATIVE ([facility] ONLY)   ____________________________________________  EKG   ____________________________________________  RADIOLOGY   ____________________________________________   PROCEDURES  Procedure(s) performed:   Procedures  Critical Care performed:    ____________________________________________   INITIAL IMPRESSION / ASSESSMENT AND PLAN / ED COURSE  Pertinent labs & imaging results that were available during my care of the patient were reviewed by me and considered in my medical decision making (see chart for details).  Patient is not compliant with medications and with psychotic features such as auditory hallucinations. A Schmidt aware of placement her IVC. He is understanding of the plan and willing to comply.      ____________________________________________   FINAL CLINICAL IMPRESSION(S) / ED DIAGNOSES  Psychosis.    NEW MEDICATIONS STARTED DURING THIS VISIT:  New Prescriptions   No medications on file     Note:  This document was prepared using Dragon voice recognition software and may include unintentional dictation errors.    Myrna Blazeravid Matthew Schaevitz, MD 04/30/16 [id number]

## 2016-04-30 NOTE — ED Triage Notes (Signed)
Pt to ED reporting "I have bipolar disorder and I need to see someone and feel safe." When asked to describe why pt needs to be seen pt requested to wait to talk until in a room. Pt was able to tell this RN that he is hearing voices but will not elaborate on what they are saying. PT denies SI or HI at this time. Pt reports he has just recently left jail and has not handled the transition well. Pt denies medical complaint at this time.

## 2016-04-30 NOTE — BH Assessment (Signed)
Assessment Note  Albert Miranda is an 36 y.o. male. Who presents for AMS and medication management. Pt  was agreeable to complete assessment. Pt was difficult to understand much of the time and timelines were inconsistent. Pt unable to provide clear history.Pt was rambling throughout the assessment and often went off into tangents giving inconsistent responses including " write down love and peace and get my vitals in my legs."Pt has acknowledged that he's been living at a hotel. Patient reports being recently discharged from prison. The time frame is somewhat unclear as patient states that he's been out of prison for 4 months, then later states 5 months. Patient does confirm that he is not taking his medication since released. Patient states "my mind started to hurt so I came here." Patient reports a history of Bipolar Disorder. Patient continues to repeat I need some medications for my Bipolar. Patient reports sleeping well. patient extremely confused and suspicious of writer it is unclear as to whether or not  patient has a guardian. Patient initially states that Enzo Montgomery, his mother is his guardian. Patient later recanted statement and shares that he's unsure if she is legally his Guardian. He is unable to give any additional history.Pt. denies any suicidal ideation, plan or intent. Pt. denies the presence of any auditory or visual hallucinations at this time. Patient  complains of pain in his legs. Pt presenting with impaired insight, judgement and impulse control, further evaluation is recommended.     Diagnosis: Schizoaffective Disorder           Past Medical History:  Past Medical History:  Diagnosis Date  . Patient denies medical problems     No past surgical history on file.  Family History: No family history on file.  Social History:  reports that he has been smoking Cigarettes.  He has been smoking about 1.00 pack per day. He has never used smokeless tobacco. He reports  that he does not drink alcohol or use drugs.  Additional Social History:  Alcohol / Drug Use Pain Medications: Pt denies  Prescriptions: Pt denes  Over the Counter: Pt denies  History of alcohol / drug use?: No history of alcohol / drug abuse Longest period of sobriety (when/how long): N/A  CIWA: CIWA-Ar BP: 131/84 Pulse Rate: (!) 109 COWS:    Allergies: No Known Allergies  Home Medications:  (Not in a hospital admission)  OB/GYN Status:  No LMP for male patient.  General Assessment Data Location of Assessment: [facility] ED [name] Assessment: In system Is this a Tele or Face-to-Face Assessment?: Face-to-Face Is this an Initial Assessment or a Re-assessment for this encounter?: Initial Assessment Marital status:  (UTA) Living Arrangements: Other (Comment) (Pt states that he was in a hotel ) Can pt return to current living arrangement?: Yes Admission Status: Voluntary Is patient capable of signing voluntary admission?: Yes Referral Source: Self/Family/Friend Insurance type: Medicare   Medical Screening Exam [facility] Walk-in ONLY) Medical Exam completed: Yes  Crisis Care Plan Living Arrangements: Other (Comment) (Pt states that he was in a hotel ) Legal Guardian: Other: (Unknown ) Name of Psychiatrist: None Name of Therapist: None   Education Status Is patient currently in school?:  (UTA ) Current Grade: N/A Highest grade of school patient has completed: N/A Name of school: N/A Contact person: N/A  Risk to self with the past 6 months Suicidal Ideation: No Has patient been a risk to self within the past 6 months prior to admission? : No Suicidal Intent: No  Has patient had any suicidal intent within the past 6 months prior to admission? : No Is patient at risk for suicide?: No Suicidal Plan?: No Has patient had any suicidal plan within the past 6 months prior to admission? : No Access to Means: No What has been your use of drugs/alcohol within the last 12 months?: Denies   Previous Attempts/Gestures: No How many times?: 0 Other Self Harm Risks: 0 Triggers for Past Attempts: Other (Comment) (n/a) Intentional Self Injurious Behavior: None Family Suicide History: Unable to assess Recent stressful life event(s):  (UTA) Persecutory voices/beliefs?: No Depression: No Depression Symptoms:  (N/A) Substance abuse history and/or treatment for substance abuse?: No Suicide prevention information given to non-admitted patients:  (Pt denies )  Risk to Others within the past 6 months Homicidal Ideation: No Does patient have any lifetime risk of violence toward others beyond the six months prior to admission? : Unknown Thoughts of Harm to Others: No Current Homicidal Intent: No Current Homicidal Plan: No Access to Homicidal Means: No Identified Victim: N/A History of harm to others?:  (UTA) Assessment of Violence: None Noted Violent Behavior Description: N/A Does patient have access to weapons?: No Criminal Charges Pending?:  (UTA) Does patient have a court date:  (UTA) Is patient on probation?:  (UTA)  Psychosis Hallucinations: None noted (PT denied ) Delusions: None noted  Mental Status Report Appearance/Hygiene: In scrubs Eye Contact: Poor Motor Activity: Freedom of movement Speech: Tangential Level of Consciousness: Alert Mood: Anxious, Suspicious Affect: Anxious, Constricted Anxiety Level: Moderate Thought Processes: Irrelevant, Thought Blocking Judgement: Impaired Orientation: Place, Person Obsessive Compulsive Thoughts/Behaviors: Minimal  Cognitive Functioning Concentration: Poor Memory: Recent Impaired, Remote Impaired IQ: Average Insight: Poor Impulse Control: Poor Appetite: Good Weight Loss:  (UTA) Weight Gain:  (UTA) Sleep: No Change Total Hours of Sleep: 6 Vegetative Symptoms: None  ADLScreening [facility]) Patient's cognitive ability adequate to safely complete daily activities?: Yes Patient able to express  need for assistance with ADLs?: Yes Independently performs ADLs?: Yes (appropriate for developmental age)  Prior Inpatient Therapy Prior Inpatient Therapy: Yes [facility] Therapy Facilty/Provider(s): 2013 Reason for Treatment: Schizoaffective Disorder  Prior Outpatient Therapy Prior Outpatient Therapy: No Prior Therapy Dates: N/A Prior Therapy Facilty/Provider(s): N/A Reason for Treatment: N/A Does patient have an ACCT team?: Unknown Does patient have Intensive In-House Services?  : Unknown Does patient have [facility] services? : Unknown Does patient have P4CC services?: Unknown  ADL Screening (condition at time of admission) Patient's cognitive ability adequate to safely complete daily activities?: Yes Patient able to express need for assistance with ADLs?: Yes Independently performs ADLs?: Yes (appropriate for developmental age)       Abuse/Neglect Assessment (Assessment to be complete while patient is alone) Physical Abuse:  (UTA) Verbal Abuse:  (UTA) Sexual Abuse:  (UTA) Exploitation of patient/patient's resources:  (UTA) Self-Neglect:  (UTA) Possible abuse reported to::  (UTA) Values / Beliefs Cultural Requests During Hospitalization:  (UTA) Spiritual Requests During Hospitalization:  (UTA) Consults Spiritual Care Consult Needed:  (UTA) Social Work Consult Needed:  Industrial/product designer(UTA) Merchant navy officerAdvance Directives (For Healthcare) Does Patient Have a Medical Advance Directive?: No    Additional Information 1:1 In Past 12 Months?: No CIRT Risk: No Elopement Risk: No Does patient have medical clearance?: Yes     Disposition:  Disposition Initial Assessment Completed for this Encounter: Yes Disposition of Patient: Referred to Patient referred to: Other (Comment) (Consult with Psych MD)  On Site Evaluation by:   Reviewed with Physician:  Asa Saunas 04/30/2016 6:55 PM

## 2016-04-30 NOTE — ED Notes (Signed)
Pt. To BHU from ED ambulatory without difficulty, to room  BHU 1. [facility]. Pt. Is alert and oriented, warm and dry in no distress. Pt. Denies SI, HI, and AVH. Patient rambling when speaking. Patient said something about us helping with his sexual orientation. This Clinical research associatewriter informed patient that he could speak with the councilors or doctor about that and maybe they could help him. Patient was in agreement. Pt. Calm and cooperative. Pt. Made aware of security cameras and Q15 minute rounds. Pt. Encouraged to let Nursing staff know of any concerns or needs. Patient is currently in shower.

## 2016-04-30 NOTE — ED Notes (Signed)
[facility] Patient to go to bed BHU 1.

## 2016-05-01 DIAGNOSIS — Z91199 Patient's noncompliance with other medical treatment and regimen due to unspecified reason: Secondary | ICD-10-CM

## 2016-05-01 DIAGNOSIS — F1210 Cannabis abuse, uncomplicated: Secondary | ICD-10-CM

## 2016-05-01 DIAGNOSIS — Z9119 Patient's noncompliance with other medical treatment and regimen: Secondary | ICD-10-CM

## 2016-05-01 DIAGNOSIS — F259 Schizoaffective disorder, unspecified: Secondary | ICD-10-CM

## 2016-05-01 DIAGNOSIS — F29 Unspecified psychosis not due to a substance or known physiological condition: Secondary | ICD-10-CM | POA: Diagnosis not present

## 2016-05-01 DIAGNOSIS — F250 Schizoaffective disorder, bipolar type: Secondary | ICD-10-CM

## 2016-05-01 MED ORDER — PALIPERIDONE ER 3 MG PO TB24
6.0000 mg | ORAL_TABLET | Freq: Every day | ORAL | Status: DC
  Administered 2016-05-01: 6 mg via ORAL
  Filled 2016-05-01: qty 2

## 2016-05-01 MED ORDER — NICOTINE 21 MG/24HR TD PT24
21.0000 mg | MEDICATED_PATCH | Freq: Once | TRANSDERMAL | Status: DC
  Administered 2016-05-01: 21 mg via TRANSDERMAL
  Filled 2016-05-01: qty 1

## 2016-05-01 MED ORDER — LORAZEPAM 2 MG PO TABS
2.0000 mg | ORAL_TABLET | ORAL | Status: DC | PRN
  Administered 2016-05-02: 2 mg via ORAL
  Filled 2016-05-01: qty 1

## 2016-05-01 MED ORDER — LORAZEPAM 2 MG PO TABS
2.0000 mg | ORAL_TABLET | Freq: Once | ORAL | Status: AC
  Administered 2016-05-01: 2 mg via ORAL
  Filled 2016-05-01: qty 1

## 2016-05-01 MED ORDER — HYDROXYZINE HCL 25 MG PO TABS: 50.0000 mg | ORAL_TABLET | ORAL | Status: DC | PRN

## 2016-05-01 MED ORDER — TEMAZEPAM 7.5 MG PO CAPS
30.0000 mg | ORAL_CAPSULE | Freq: Every evening | ORAL | Status: DC | PRN
  Administered 2016-05-01: 30 mg via ORAL
  Filled 2016-05-01: qty 4

## 2016-05-01 NOTE — ED Notes (Signed)
Patient's mother called. This Clinical research associatewriter advised mother I could not give out any info because of HIPPA. Pt mother Jae Dire(Debbie Johnson) aware and states she just wanted to give me some info. She states, Patient medications when he was taking them was Abilify 30 mg Q day, and Lamictal 100 mg BID. She states he has been off medications for a while and has been non compliant with treatment. [place]. Omelia BlackwaterHeaden but was discharge for not being compliant with treatment. She states he was living with his father but she believes the father has took out [address] papers out on patient, and patient has been living in hotel for 2 weeks. Mother saw patient earlier in the day and patient was acting confused and was not able to cash a check.  Mothers name is Jae DireDebbie Johnson contact number for her is [phone].

## 2016-05-01 NOTE — ED Provider Notes (Signed)
Nurse cause because patient is up and anxious his mind racing. We'll try 2 mg Ativan by mouth we does with that.   Arnaldo NatalPaul F Lyndal Alamillo, MD 05/01/16 [phone]

## 2016-05-01 NOTE — ED Notes (Signed)
Patient's mom called , and Patient asleep, but easily aroused to talk to his mom, He is calm and cooperative, no signs of distress at this time. q 15 minute checks, and camera surveillance.

## 2016-05-01 NOTE — ED Notes (Signed)
Dr. Toni Amendlapacs assessing patient at this time.

## 2016-05-01 NOTE — ED Notes (Signed)
Patient is lying in bed, awake, no signs of distress at this time, he is calm and cooperative. q 15 minute checks and camera surveillance in progress.

## 2016-05-01 NOTE — Progress Notes (Signed)
Referral information for Psychiatric Hospitalization faxed to;    [facility] [phone] or [phone]   [facility] [phone]),    [facility] [phone]),    [facility] [phone])   [facility] [phone]),    [facility] [phone]),

## 2016-05-01 NOTE — ED Notes (Signed)
Patient ate 100% of supper, patient without any behavioral issues at this time, q 15 minute checks and camera surveillance in progress.

## 2016-05-01 NOTE — ED Notes (Signed)
Patient is sleeping leaning up against wall at head of bed, nurse went in to talk with him, but He remains sleeping, Patient is safe.
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
## 2016-05-01 NOTE — ED Notes (Signed)
Patient made aware of mother calling. Patient given code [id number] so when he is able to call in the AM to give her code for her to be able to receive any info on his treatment.

## 2016-05-01 NOTE — ED Notes (Signed)
Patient up to nursing station asking for drink and Bible, states that someone needs to check on him every hour, nurse reassured him that she or other staff would be checking on him often, told him about cameras in room, nurse ask him if He was afraid of anything and He states " no" I just don't like my mind racing , patient would not say what His thoughts were, just when ask if it were negative thoughts he said " No" Nurse will continue to monitor. q 15 minute checks and camera surveillance at all times.

## 2016-05-01 NOTE — ED Notes (Signed)
Patient is alert and oriented, up to nursing station, no signs of distress, just asks for several things, but he is cooperative, states he did feel better after being able to sleep some. q 15 minute checks and camera surveillance in progress.
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
## 2016-05-01 NOTE — ED Notes (Signed)
Patient's mom in to visit, supervised visit.

## 2016-05-01 NOTE — ED Provider Notes (Signed)
-----------------------------------------   7:00 AM on 05/01/2016 -----------------------------------------   Blood pressure 116/69, pulse 67, temperature 98.2 F (36.8 C), temperature source Oral, resp. rate 16, height 5\' 11"  (1.803 m), weight 215 lb (97.5 kg), SpO2 99 %.  The patient had no acute events since last update.  Calm and cooperative at this time.  Disposition is pending Psychiatry/Behavioral Medicine team recommendations.     Irean HongJade J Sung, MD 05/01/16 0700

## 2016-05-01 NOTE — ED Notes (Signed)
Patient up to nursing station, states that " I need my medicine" states that He has racing thoughts , denies Si/hi or avh, said that He had he has heard voices in the past and did not want to start that again, also states that he is having anxiety, nurse told him that she would call ED Doctor and get him something for being anxious. q 15 minute checks and camera surveillance in progress.

## 2016-05-01 NOTE — ED Notes (Signed)
Nurse did administer ativan per ED Doctor and patient ask several questions, regarding breakfast, the time, and about the psychiatrist and how He wanted someone to check on him often, nurse ask if He felt afraid, and He states " No " He is cooperative and but just anxious, states " I just want my mind to get right" nurse will continue to monitor.

## 2016-05-01 NOTE — ED Notes (Signed)
Patient ate 100% of lunch 

## 2016-05-02 DIAGNOSIS — F29 Unspecified psychosis not due to a substance or known physiological condition: Secondary | ICD-10-CM | POA: Diagnosis not present

## 2016-05-02 MED ORDER — MAGNESIUM HYDROXIDE 400 MG/5ML PO SUSP
ORAL | Status: AC
  Administered 2016-05-02: 30 mL via ORAL
  Filled 2016-05-02: qty 30

## 2016-05-02 MED ORDER — MAGNESIUM HYDROXIDE 400 MG/5ML PO SUSP
30.0000 mL | Freq: Once | ORAL | Status: AC
  Administered 2016-05-02: 30 mL via ORAL

## 2016-05-02 NOTE — BH Assessment (Signed)
Patient has been accepted [facility].  [facility] Accepting physician is Dr. Shanda Bumpselores Brown.  Call report to [phone].  Representative was [employer].  ER Staff is aware of it Elder Love(Emlie, ER Sect. & Amy B., Patient's Nurse)

## 2016-05-02 NOTE — ED Provider Notes (Signed)
-----------------------------------------   6:25 AM on 05/02/2016 -----------------------------------------   Blood pressure 119/75, pulse (!) 59, temperature 98 F (36.7 C), temperature source Oral, resp. rate 18, height 5\' 11"  (1.803 m), weight 215 lb (97.5 kg), SpO2 100 %.  The patient had no acute events since last update.  Milk of magnesia given for complaints of constipation. Calm and cooperative at this time.  Disposition is pending Psychiatry/Behavioral Medicine team recommendations.     Irean HongJade J Demarrius Guerrero, MD 05/02/16 [phone]

## 2016-05-02 NOTE — BH Assessment (Signed)
Received phone call from patient's RN Irish Lack(Ruthie) stating patient's mother called Eunice Blase(Debbie [phone]) and was asking about the patient. They updated her on where the patient transferred to and that the number for her was wrong in the system.

## 2016-05-02 NOTE — ED Notes (Addendum)
RN attempted to call pt's mother.  The phone number [phone]) was not a working number. This was the only number in pt's chart.

## 2016-05-02 NOTE — ED Notes (Signed)
Pt dressing for discharge. Pt gave RN verbal permission to notify his mother of his transfer. All belongings given to officer. Maintained on 15 minute checks and observation by security camera for safety.

## 2016-05-02 NOTE — ED Notes (Signed)
Pt sitting on bed writing upon approach. Pt stated he had a good night. Pt given breakfast tray. No concerns at this time. Maintained on 15 minute checks and observation by security camera for safety.

## 2016-05-02 NOTE — ED Notes (Signed)
[facility] of [facility]'s departure.

## 2016-05-02 NOTE — ED Notes (Signed)
[facility] Pt IVC, transported by [employer]. All belongings given to officer. Pt accepting.

## 2016-05-02 NOTE — ED Notes (Signed)
Pt made aware [facility] and given and informational packet. Pt accepting. "Yeah, Ill go there. They can come pick me up."  Maintained on 15 minute checks and observation by security camera for safety.

## 2016-05-29 DIAGNOSIS — F3175 Bipolar disorder, in partial remission, most recent episode depressed: Secondary | ICD-10-CM | POA: Diagnosis not present

## 2016-06-08 DIAGNOSIS — R21 Rash and other nonspecific skin eruption: Secondary | ICD-10-CM | POA: Diagnosis not present

## 2016-06-08 DIAGNOSIS — R229 Localized swelling, mass and lump, unspecified: Secondary | ICD-10-CM | POA: Diagnosis not present

## 2016-06-18 DIAGNOSIS — R369 Urethral discharge, unspecified: Secondary | ICD-10-CM | POA: Diagnosis not present

## 2016-06-18 DIAGNOSIS — R222 Localized swelling, mass and lump, trunk: Secondary | ICD-10-CM | POA: Diagnosis not present

## 2016-07-01 ENCOUNTER — Emergency Department
Admission: EM | Admit: 2016-07-01 | Discharge: 2016-07-01 | Disposition: A | Discharge status: Home / Self Care | Payer: Medicare Other | Attending: Emergency Medicine | Admitting: Emergency Medicine

## 2016-07-01 DIAGNOSIS — S70362A Insect bite (nonvenomous), left thigh, initial encounter: Secondary | ICD-10-CM | POA: Diagnosis not present

## 2016-07-01 DIAGNOSIS — Y929 Unspecified place or not applicable: Secondary | ICD-10-CM | POA: Diagnosis not present

## 2016-07-01 DIAGNOSIS — Y939 Activity, unspecified: Secondary | ICD-10-CM | POA: Diagnosis not present

## 2016-07-01 DIAGNOSIS — J039 Acute tonsillitis, unspecified: Secondary | ICD-10-CM | POA: Diagnosis not present

## 2016-07-01 DIAGNOSIS — Y999 Unspecified external cause status: Secondary | ICD-10-CM | POA: Diagnosis not present

## 2016-07-01 DIAGNOSIS — Z79899 Other long term (current) drug therapy: Secondary | ICD-10-CM | POA: Insufficient documentation

## 2016-07-01 DIAGNOSIS — F17210 Nicotine dependence, cigarettes, uncomplicated: Secondary | ICD-10-CM | POA: Insufficient documentation

## 2016-07-01 DIAGNOSIS — W57XXXA Bitten or stung by nonvenomous insect and other nonvenomous arthropods, initial encounter: Secondary | ICD-10-CM | POA: Insufficient documentation

## 2016-07-01 MED ORDER — AMOXICILLIN 500 MG PO CAPS: 500.0000 mg | ORAL_CAPSULE | Freq: Three times a day (TID) | ORAL | 0 refills | Status: DC

## 2016-07-01 MED ORDER — MAGIC MOUTHWASH
10.0000 mL | Freq: Once | ORAL | Status: AC
  Administered 2016-07-01: 10 mL via ORAL
  Filled 2016-07-01: qty 10

## 2016-07-01 MED ORDER — DEXAMETHASONE SODIUM PHOSPHATE 10 MG/ML IJ SOLN
10.0000 mg | Freq: Once | INTRAMUSCULAR | Status: AC
  Administered 2016-07-01: 10 mg via INTRAMUSCULAR
  Filled 2016-07-01: qty 1

## 2016-07-01 MED ORDER — MAGIC MOUTHWASH: 5.0000 mL | Freq: Three times a day (TID) | ORAL | 0 refills | Status: DC | PRN

## 2016-07-01 MED ORDER — AMOXICILLIN 500 MG PO CAPS
500.0000 mg | ORAL_CAPSULE | Freq: Once | ORAL | Status: AC
  Administered 2016-07-01: 500 mg via ORAL
  Filled 2016-07-01: qty 1

## 2016-07-01 NOTE — ED Notes (Signed)
POCT Rapid strep performed. Rapid strep negative

## 2016-07-01 NOTE — ED Triage Notes (Signed)
Patients reports spider bite to left lateral thigh 2 days ago. Pt reports bite itself is no longer bothering him, however c/o fever, chills and is worried about infection/spider venom.  Patient also c/o sore throat; patient would like to be checked for strep.

## 2016-07-01 NOTE — Discharge Instructions (Signed)
1. Take antibiotic as prescribed (amoxicillin 500 mg 3 times daily 7 days). 2. You may take Magic mouthwash as needed for throat discomfort. 3. Return to the ER for worsening symptoms, persistent vomiting, difficulty breathing or other concerns.

## 2016-07-01 NOTE — ED Notes (Signed)
Pt ambulatory with steady gait to stat registration to ask about wait time; waiting patiently for treatment room;

## 2016-07-01 NOTE — ED Provider Notes (Signed)
[facility] Emergency Department Provider Note   ____________________________________________   First MD Initiated Contact with Patient 07/01/16 [phone]     (approximate)  I have reviewed the triage vital signs and the nursing notes.   HISTORY  Chief Complaint Insect Bite and Sore Throat    HPI IRMA DELANCEY is a 36 y.o. male who presents to the ED from home with a chief complaint of spider bite and sore throat. Patient reports being bitten by an insect 2 days ago; concerned it was a poisonous spider bite. States bite itself is no longer bothering him. Also developed sore throat for the past 2 days associated with subjective fever and chills. Denies associated chest pain, shortness of breath, abdominal pain, nausea, vomiting, diarrhea. Denies recent travel or trauma. Nothing makes his symptoms better or worse.   Past Medical History:  Diagnosis Date  . Patient denies medical problems     Patient Active Problem List   Diagnosis Date Noted  . Schizoaffective disorder (HCC) 05/01/2016  . Cannabis abuse 05/01/2016  . Noncompliance 05/01/2016    History reviewed. No pertinent surgical history.  Prior to Admission medications   Medication Sig Start Date End Date Taking? Authorizing Provider  amoxicillin (AMOXIL) 500 MG capsule Take 1 capsule (500 mg total) by mouth 3 (three) times daily. 07/01/16   Irean Hong, MD  ARIPiprazole (ABILIFY) 20 MG tablet Take 20 mg by mouth daily.     Historical Provider, MD  lamoTRIgine (LAMICTAL) 100 MG tablet Take 100 mg by mouth 2 (two) times daily.    Historical Provider, MD  magic mouthwash SOLN Take 5 mLs by mouth 3 (three) times daily as needed for mouth pain. 07/01/16   Irean Hong, MD    Allergies Patient has no known allergies.  Family History  Problem Relation Age of Onset  . Diabetes Father     Social History Social History  Substance Use Topics  . Smoking status: Current Every Day Smoker    Packs/day:  1.00    Types: Cigarettes  . Smokeless tobacco: Never Used  . Alcohol use No     Comment: occassional    Review of Systems  Constitutional: Positive for subjective fever/chills. Eyes: No visual changes. ENT: Positive for sore throat. Cardiovascular: Denies chest pain. Respiratory: Denies shortness of breath. Gastrointestinal: No abdominal pain.  No nausea, no vomiting.  No diarrhea.  No constipation. Genitourinary: Negative for dysuria. Musculoskeletal: Negative for back pain. Skin: Positive for insect bite. Negative for rash. Neurological: Negative for headaches, focal weakness or numbness.  10-point ROS otherwise negative.  ____________________________________________   PHYSICAL EXAM:  VITAL SIGNS: ED Triage Vitals  Enc Vitals Group     BP 07/01/16 0202 119/71     Pulse Rate 07/01/16 0202 (!) 101     Resp 07/01/16 0202 18     Temp 07/01/16 0202 98 F (36.7 C)     Temp Source 07/01/16 0202 Oral     SpO2 07/01/16 0202 99 %     Weight 07/01/16 0202 240 lb (108.9 kg)     Height 07/01/16 0202  (1.803 m)     Head Circumference --      Peak Flow --      Pain Score 07/01/16 0206 7     Pain Loc --      Pain Edu? --      Excl. in GC? --     Constitutional: Asleep, easily awakened for exam. Alert and oriented.  Well appearing and in no acute distress. Eyes: Conjunctivae are normal. PERRL. EOMI. Head: Atraumatic. Nose: Congestion/rhinnorhea. Mouth/Throat: Mucous membranes are moist.  Oropharynx erythematous with bilateral tonsillar swelling and exudates. There is no peritonsillar abscess. There is no hoarse or muffled voice. There is no drooling. Neck: No stridor.  Supple neck without meningismus. Hematological/Lymphatic/Immunilogical: Shotty anterior cervical lymphadenopathy. Cardiovascular: Normal rate, regular rhythm. Grossly normal heart sounds.  Good peripheral circulation. Respiratory: Normal respiratory effort.  No retractions. Lungs CTAB. Gastrointestinal:  Soft and nontender. No distention. No abdominal bruits. No CVA tenderness. Musculoskeletal: No lower extremity tenderness nor edema.  No joint effusions. Neurologic:  Normal speech and language. No gross focal neurologic deficits are appreciated. No gait instability. Skin:  Skin is warm, dry and intact. No rash noted. No petechiae. Site of insect sting at left lateral thigh with tiny abrasion. No ulceration or abscess. Psychiatric: Mood and affect are normal. Speech and behavior are normal.  ____________________________________________   LABS (all labs ordered are listed, but only abnormal results are displayed)  Labs Reviewed - No data to display ____________________________________________  EKG  None ____________________________________________  RADIOLOGY  None ____________________________________________   PROCEDURES  Procedure(s) performed: None  Procedures  Critical Care performed: No  ____________________________________________   INITIAL IMPRESSION / ASSESSMENT AND PLAN / ED COURSE  Pertinent labs & imaging results that were available during my care of the patient were reviewed by me and considered in my medical decision making (see chart for details).  36 year old male who presents with insect bite and tonsillitis. There is no evidence of venomous spider bite. Will treat tonsillitis with IM Decadron, antibiotics and magic mouthwash. Strict return precautions given. Patient verbalizes understanding and agrees with plan of care.      ____________________________________________   FINAL CLINICAL IMPRESSION(S) / ED DIAGNOSES  Final diagnoses:  Insect bite, initial encounter  Tonsillitis      NEW MEDICATIONS STARTED DURING THIS VISIT:  New Prescriptions   AMOXICILLIN (AMOXIL) 500 MG CAPSULE    Take 1 capsule (500 mg total) by mouth 3 (three) times daily.   MAGIC MOUTHWASH SOLN    Take 5 mLs by mouth 3 (three) times daily as needed for mouth pain.      Note:  This document was prepared using Dragon voice recognition software and may include unintentional dictation errors.    Irean Hong, MD 07/01/16 [phone]

## 2016-07-01 NOTE — ED Notes (Signed)
Pt. States cough and sore throat for the past two days.  Pt. Denies taking any OTC medications.  Pt. States sometimes productive green sputum.  Pt. Also states insect bite to upper lt. Thigh.  Small mark to thigh noted.

## 2016-07-01 NOTE — ED Notes (Signed)
Pt. Did not feel well when time for discharge, pt. Laid down on stretcher and hooked up to monitor.  Pt. Resting at this time.  Vitals within normal limits.

## 2016-07-05 DIAGNOSIS — F3175 Bipolar disorder, in partial remission, most recent episode depressed: Secondary | ICD-10-CM | POA: Diagnosis not present

## 2016-07-28 ENCOUNTER — Encounter: Payer: Self-pay | Admitting: Emergency Medicine

## 2016-07-28 ENCOUNTER — Emergency Department
Admission: EM | Admit: 2016-07-28 | Discharge: 2016-07-28 | Disposition: A | Discharge status: Home / Self Care | Payer: Medicare Other | Attending: Emergency Medicine | Admitting: Emergency Medicine

## 2016-07-28 DIAGNOSIS — Z046 Encounter for general psychiatric examination, requested by authority: Secondary | ICD-10-CM | POA: Diagnosis not present

## 2016-07-28 DIAGNOSIS — S30861A Insect bite (nonvenomous) of abdominal wall, initial encounter: Secondary | ICD-10-CM | POA: Diagnosis not present

## 2016-07-28 DIAGNOSIS — F419 Anxiety disorder, unspecified: Secondary | ICD-10-CM | POA: Diagnosis not present

## 2016-07-28 DIAGNOSIS — Z7984 Long term (current) use of oral hypoglycemic drugs: Secondary | ICD-10-CM | POA: Insufficient documentation

## 2016-07-28 DIAGNOSIS — Y939 Activity, unspecified: Secondary | ICD-10-CM | POA: Diagnosis not present

## 2016-07-28 DIAGNOSIS — Z79899 Other long term (current) drug therapy: Secondary | ICD-10-CM | POA: Diagnosis not present

## 2016-07-28 DIAGNOSIS — Z008 Encounter for other general examination: Secondary | ICD-10-CM

## 2016-07-28 DIAGNOSIS — Y929 Unspecified place or not applicable: Secondary | ICD-10-CM | POA: Diagnosis not present

## 2016-07-28 DIAGNOSIS — Y999 Unspecified external cause status: Secondary | ICD-10-CM | POA: Insufficient documentation

## 2016-07-28 DIAGNOSIS — F17210 Nicotine dependence, cigarettes, uncomplicated: Secondary | ICD-10-CM | POA: Insufficient documentation

## 2016-07-28 DIAGNOSIS — W57XXXA Bitten or stung by nonvenomous insect and other nonvenomous arthropods, initial encounter: Secondary | ICD-10-CM | POA: Insufficient documentation

## 2016-07-28 HISTORY — DX: Bipolar disorder, unspecified: F31.9

## 2016-07-28 LAB — COMPREHENSIVE METABOLIC PANEL
ALT: 36 U/L (ref 17–63)
ANION GAP: 8 (ref 5–15)
AST: 24 U/L (ref 15–41)
Albumin: 4.2 g/dL (ref 3.5–5.0)
Alkaline Phosphatase: 73 U/L (ref 38–126)
BILIRUBIN TOTAL: 0.6 mg/dL (ref 0.3–1.2)
BUN: 18 mg/dL (ref 6–20)
CHLORIDE: 99 mmol/L — AB (ref 101–111)
CO2: 28 mmol/L (ref 22–32)
Calcium: 9.8 mg/dL (ref 8.9–10.3)
Creatinine, Ser: 1.16 mg/dL (ref 0.61–1.24)
GFR calc Af Amer: >60 mL/min (ref >60)
GFR calc non Af Amer: >60 mL/min (ref >60)
GLUCOSE: 396 mg/dL — AB (ref 65–99)
POTASSIUM: 4.4 mmol/L (ref 3.5–5.1)
Sodium: 135 mmol/L (ref 135–145)
TOTAL PROTEIN: 8.2 g/dL — AB (ref 6.5–8.1)

## 2016-07-28 LAB — URINE DRUG SCREEN, QUALITATIVE (ARMC ONLY)
AMPHETAMINES, UR SCREEN: NOT DETECTED
BENZODIAZEPINE, UR SCRN: NOT DETECTED
Barbiturates, Ur Screen: NOT DETECTED
Cannabinoid 50 Ng, Ur ~~LOC~~: POSITIVE — AB
Cocaine Metabolite,Ur ~~LOC~~: NOT DETECTED
MDMA (Ecstasy)Ur Screen: NOT DETECTED
METHADONE SCREEN, URINE: NOT DETECTED
OPIATE, UR SCREEN: NOT DETECTED
PHENCYCLIDINE (PCP) UR S: NOT DETECTED
Tricyclic, Ur Screen: NOT DETECTED

## 2016-07-28 LAB — ETHANOL

## 2016-07-28 LAB — CBC
HEMATOCRIT: 51.4 % (ref 40.0–52.0)
HEMOGLOBIN: 17.7 g/dL (ref 13.0–18.0)
MCH: 30.9 pg (ref 26.0–34.0)
MCHC: 34.4 g/dL (ref 32.0–36.0)
MCV: 89.9 fL (ref 80.0–100.0)
Platelets: 284 10*3/uL (ref 150–440)
RBC: 5.72 MIL/uL (ref 4.40–5.90)
RDW: 13.4 % (ref 11.5–14.5)
WBC: 9.5 10*3/uL (ref 3.8–10.6)

## 2016-07-28 LAB — GLUCOSE, CAPILLARY: Glucose-Capillary: 321 mg/dL — ABNORMAL HIGH (ref 65–99)

## 2016-07-28 MED ORDER — HYDROCORTISONE 2.5 % EX OINT: TOPICAL_OINTMENT | Freq: Three times a day (TID) | CUTANEOUS | 0 refills | Status: DC

## 2016-07-28 MED ORDER — NICOTINE 21 MG/24HR TD PT24
MEDICATED_PATCH | TRANSDERMAL | Status: AC
  Filled 2016-07-28: qty 1

## 2016-07-28 MED ORDER — METFORMIN HCL 500 MG PO TABS
ORAL_TABLET | ORAL | Status: AC
  Filled 2016-07-28: qty 2

## 2016-07-28 MED ORDER — METFORMIN HCL 500 MG PO TABS: 1000.0000 mg | ORAL_TABLET | Freq: Once | ORAL | Status: DC

## 2016-07-28 MED ORDER — NICOTINE 21 MG/24HR TD PT24
21.0000 mg | MEDICATED_PATCH | Freq: Once | TRANSDERMAL | Status: DC
  Administered 2016-07-28: 21 mg via TRANSDERMAL

## 2016-07-28 NOTE — BH Assessment (Signed)
Assessment Note  Albert Miranda is an 36 y.o. male who presents to ED voluntarily reporting "my medicines got changed on me". Pt was previously prescribed Klonopin and this medication has recently been changed to Vistaril (Hydroxyzine) and "I think it needs to be changed back to Klonopin". He reports these changes were made "a month ago". Pt denied SI/HI/Delusions. When asked about hallucinations, pt states "I just hear talking" (no reports of command auditory hallucinations). Pt denied having acute withdrawal symptoms as a result of his recent medication change; however, pt did report "I just want to make sure I don't have any reactions to them being changed". Pt is currently being seen by [facility] for medication management and therapeutic treatment. He denied depressive symptoms. He denied having concerns with sleep patterns and appetite. He reports alcohol and cannabis use yesterday - denied regular recreational use. Pt was alert and oriented x4 during assessment. Pt was logical and coherent in thought process and content.     Diagnosis: Bipolar 1 Disorder, by history  Past Medical History:  Past Medical History:  Diagnosis Date  . Bipolar 1 disorder (HCC)   . Patient denies medical problems     Past Surgical History:  Procedure Laterality Date  . LEG SURGERY      Family History:  Family History  Problem Relation Age of Onset  . Diabetes Father     Social History:  reports that he has been smoking Cigarettes.  He has been smoking about 1.00 pack per day. He has never used smokeless tobacco. He reports that he does not drink alcohol or use drugs.  Additional Social History:  Alcohol / Drug Use Pain Medications: None Reported Prescriptions: None Reported Over the Counter: None Reported History of alcohol / drug use?: Yes Longest period of sobriety (when/how long): Unable to Assess Negative Consequences of Use: Personal relationships, Work / Programmer, multimedia, Surveyor, quantity Withdrawal Symptoms:  (Pt  denies withdrawal symptoms) Substance #1 Name of Substance 1: Alcohol 1 - Age of First Use: Unable to Assess 1 - Amount (size/oz): Unable to Assess 1 - Frequency: Unable to Assess 1 - Duration: Unable to Assess 1 - Last Use / Amount: "yesterday" Substance #2 Name of Substance 2: Cannabis 2 - Age of First Use: Unable to Assess 2 - Amount (size/oz): Unable to Assess 2 - Frequency: Unable to Assess 2 - Duration: Unable to Assess 2 - Last Use / Amount: "yesterday"  CIWA: CIWA-Ar BP: 125/82 Pulse Rate: 96 COWS:    Allergies: No Known Allergies  Home Medications:  (Not in a hospital admission)  OB/GYN Status:  No LMP for male patient.  General Assessment Data Location of Assessment: [facility] ED [name] Assessment: In system Is this a Tele or Face-to-Face Assessment?: Face-to-Face Is this an Initial Assessment or a Re-assessment for this encounter?: Initial Assessment Marital status: Single Maiden name: N/A Is patient pregnant?: No Pregnancy Status: No Living Arrangements: Alone Can pt return to current living arrangement?: Yes Admission Status: Voluntary Is patient capable of signing voluntary admission?: Yes Referral Source: Self/Family/Friend Insurance type: Medicare  Medical Screening Exam [facility] Walk-in ONLY) Medical Exam completed: Yes  Crisis Care Plan Living Arrangements: Alone Legal Guardian: Other: (Self) Name of Psychiatrist: [facility] Name of Therapist: [facility]  Education Status Is patient currently in school?: No Current Grade: N/A Highest grade of school patient has completed: Some College Name of school: N/A Contact person: N/A  Risk to self with the past 6 months Suicidal Ideation: No Has patient been a risk to  self within the past 6 months prior to admission? : No Suicidal Intent: No Has patient had any suicidal intent within the past 6 months prior to admission? : No Is patient at risk for suicide?: No Suicidal Plan?: No Has patient had any suicidal plan  within the past 6 months prior to admission? : No Access to Means: No What has been your use of drugs/alcohol within the last 12 months?: Alcohol, Cannabis Previous Attempts/Gestures: No How many times?: 0 Other Self Harm Risks: N/A Triggers for Past Attempts: None known Intentional Self Injurious Behavior: None Family Suicide History: Unknown Recent stressful life event(s):  (None Reported) Persecutory voices/beliefs?: No Depression: No Depression Symptoms:  (None Reported; Pt denied all symptoms) Substance abuse history and/or treatment for substance abuse?: Yes Suicide prevention information given to non-admitted patients: Not applicable  Risk to Others within the past 6 months Homicidal Ideation: No Does patient have any lifetime risk of violence toward others beyond the six months prior to admission? : No Thoughts of Harm to Others: No Current Homicidal Intent: No Current Homicidal Plan: No Access to Homicidal Means: No Identified Victim: N/A History of harm to others?: No Assessment of Violence: None Noted Violent Behavior Description: N/A Does patient have access to weapons?: No Criminal Charges Pending?: No Does patient have a court date: No Is patient on probation?: No  Psychosis Hallucinations: Auditory Delusions: None noted  Mental Status Report Appearance/Hygiene: In scrubs Eye Contact: Fair Motor Activity: Freedom of movement Speech: Logical/coherent Level of Consciousness: Alert Mood: Pleasant Affect: Appropriate to circumstance Anxiety Level: Moderate Thought Processes: Coherent, Relevant Judgement: Unimpaired Orientation: Person, Place, Time, Situation, Appropriate for developmental age Obsessive Compulsive Thoughts/Behaviors: None  Cognitive Functioning Concentration: Normal Memory: Recent Intact, Remote Intact IQ: Average Insight: Good Impulse Control: Good Appetite: Good Weight Loss: 0 Weight Gain: 0 Sleep: No Change Total Hours of Sleep:  7 Vegetative Symptoms: None  ADLScreening [facility] Assessment Services) Patient's cognitive ability adequate to safely complete daily activities?: Yes Patient able to express need for assistance with ADLs?: Yes Independently performs ADLs?: Yes (appropriate for developmental age)  Prior Inpatient Therapy Prior Inpatient Therapy: Yes Prior Therapy Dates: Unable to Recall [facility] Reason for Treatment: Bipolar  Prior Outpatient Therapy Prior Outpatient Therapy: Yes Prior Therapy Dates: Current Prior Therapy Facilty/Provider(s): [facility] Reason for Treatment: Bipolar; Medication Management Does patient have an ACCT team?: No Does patient have Intensive In-House Services?  : No Does patient have [facility] services? : No Does patient have P4CC services?: No  ADL Screening (condition at time of admission) Patient's cognitive ability adequate to safely complete daily activities?: Yes Patient able to express need for assistance with ADLs?: Yes Independently performs ADLs?: Yes (appropriate for developmental age)       Abuse/Neglect Assessment (Assessment to be complete while patient is alone) Physical Abuse: Denies Verbal Abuse: Denies Sexual Abuse: Denies Exploitation of patient/patient's resources: Denies Self-Neglect: Denies Values / Beliefs Cultural Requests During Hospitalization: None Spiritual Requests During Hospitalization: None Consults Spiritual Care Consult Needed: No Social Work Consult Needed: No Merchant navy officerAdvance Directives (For Healthcare) Does Patient Have a Medical Advance Directive?: No Would patient like information on creating a medical advance directive?: No - Patient declined    Additional Information 1:1 In Past 12 Months?: No CIRT Risk: No Elopement Risk: No Does patient have medical clearance?: Yes  Child/Adolescent Assessment Running Away Risk:  (Pt is an adult)  Disposition:  Disposition Initial Assessment  Completed for this Encounter: Yes Disposition of Patient: Referred  to (Psych Consult) Patient referred to: Other (Comment) (Psych Consult)  On Site Evaluation by:   Reviewed with Physician:    Wilmon Arms 07/28/2016 2:14 PM

## 2016-07-28 NOTE — ED Triage Notes (Addendum)
[facility] and states he wants a few days to evaluate his effect on the medication. States "I want to be evaluated for a couple of days for my behavior" Pt was just seen here this morning for bug bite on abdomen. Pt states he was taken off Klonopin that he was on for several years and wants to get back on it. Pt states his meds were changed about a month ago.

## 2016-07-28 NOTE — ED Notes (Signed)
See triage note. States he may have been bitten by something   Has 2 red areas to abd   Positive itching

## 2016-07-28 NOTE — ED Triage Notes (Signed)
Pt c/o insect bite to left side of abdomen, states he noticed it last night. C/o itching and irritation around the site. Denies pain.

## 2016-07-28 NOTE — ED Notes (Signed)
Pt taking a shower 

## 2016-07-28 NOTE — ED Notes (Signed)
Patient resting quietly in room. No noted distress or abnormal behaviors noted. Will continue 15 minute checks and observation by security camera for safety. 

## 2016-07-28 NOTE — ED Provider Notes (Signed)
[facility] Emergency Department Provider Note  Time seen: 11:03 AM  I have reviewed the triage vital signs and the nursing notes.   HISTORY  Chief Complaint Psychiatric Evaluation    HPI Jobie QuakerJohn W Slocumb is a 36 y.o. male with a past medical history of bipolar, schizoaffective, presents to the emergency department for psychiatric evaluation. According to the patient approximately 1 month ago he states his medications were changed in which she was taken off of Klonopin and started on hydroxyzine. Patient states since that time he has had trouble concentrating. He is requesting to be seen by psychiatry for evaluation. Denies any SI or HI. Denies any medical complaints today besides recently being bit by a bug on his left abdomen. Patient does state occasional Nonuse and occasional alcohol use but denies any currently/recently. Denies any other substance use.  Past Medical History:  Diagnosis Date  . Bipolar 1 disorder (HCC)   . Patient denies medical problems     Patient Active Problem List   Diagnosis Date Noted  . Schizoaffective disorder (HCC) 05/01/2016  . Cannabis abuse 05/01/2016  . Noncompliance 05/01/2016    Past Surgical History:  Procedure Laterality Date  . LEG SURGERY      Prior to Admission medications   Medication Sig Start Date End Date Taking? Authorizing Provider  ARIPiprazole (ABILIFY) 30 MG tablet Take 30 mg by mouth at bedtime. 07/05/16  Yes [provider]  hydrOXYzine (VISTARIL) 50 MG capsule Take 50 mg by mouth 2 (two) times daily. 07/05/16  Yes [provider]  lamoTRIgine (LAMICTAL) 200 MG tablet Take 200 mg by mouth daily. 07/05/16  Yes [provider]  metFORMIN (GLUCOPHAGE) 500 MG tablet Take 500 mg by mouth 2 (two) times daily. 05/17/16  Yes [provider]  amoxicillin (AMOXIL) 500 MG capsule Take 1 capsule (500 mg total) by mouth 3 (three) times daily. Patient not taking: Reported on 07/28/2016  07/01/16   Irean HongSung, Jade J, MD  hydrocortisone 2.5 % ointment Apply topically 3 (three) times daily. Patient not taking: Reported on 07/28/2016 07/28/16   Kem Boroughsriplett, Cari B, FNP  magic mouthwash SOLN Take 5 mLs by mouth 3 (three) times daily as needed for mouth pain. Patient not taking: Reported on 07/28/2016 07/01/16   Irean HongSung, Jade J, MD    No Known Allergies  Family History  Problem Relation Age of Onset  . Diabetes Father     Social History Social History  Substance Use Topics  . Smoking status: Current Every Day Smoker    Packs/day: 1.00    Types: Cigarettes  . Smokeless tobacco: Never Used  . Alcohol use No     Comment: occassional    Review of Systems Constitutional: Negative for fever. Cardiovascular: Negative for chest pain. Respiratory: Negative for shortness of breath. Gastrointestinal: Negative for abdominal pain, vomiting and diarrhea. Genitourinary: Negative for dysuria. Musculoskeletal: Negative for back pain. Skin: Small bite/hive to left abdomen. Neurological: Negative for headache All other ROS negative  ____________________________________________   PHYSICAL EXAM:  VITAL SIGNS: ED Triage Vitals  Enc Vitals Group     BP 07/28/16 1009 125/82     Pulse Rate 07/28/16 1009 96     Resp 07/28/16 1009 18     Temp 07/28/16 1009 98.3 F (36.8 C)     Temp Source 07/28/16 1009 Oral     SpO2 07/28/16 1009 98 %     Weight 07/28/16 1010 250 lb (113.4 kg)     Height 07/28/16 1010  5\' 11"  (1.803 m)     Head Circumference --      Peak Flow --      Pain Score 07/28/16 1022 0     Pain Loc --      Pain Edu? --      Excl. in GC? --    Constitutional: Alert and oriented. Well appearing and in no distress. Eyes: Normal exam ENT   Head: Normocephalic and atraumatic.   Mouth/Throat: Mucous membranes are moist. Cardiovascular: Normal rate, regular rhythm. No murmur Respiratory: Normal respiratory effort without tachypnea nor retractions. Breath sounds are  clear Gastrointestinal: Soft and nontender. No distention.  Musculoskeletal: Nontender with normal range of motion in all extremities.  Neurologic:  Normal speech and language. No gross focal neurologic deficits  Skin:  Skin is warm, dry and intact.  Psychiatric: Mood and affect are normal.   ____________________________________________    INITIAL IMPRESSION / ASSESSMENT AND PLAN / ED COURSE  Pertinent labs & imaging results that were available during my care of the patient were reviewed by me and considered in my medical decision making (see chart for details).  The patient presents to the emergency department for psychiatric evaluation. Denies any SI or HI, does not meet IVC criteria. Patient is here voluntarily. Patient's labs are And positive otherwise within normal limits. Patient's main concern appears to be medication change which is made approximate one month ago. He states since that time he has not been able to concentrate or think clearly. We'll have the patient evaluated by psychiatry. Again the patient is here voluntarily and does not meet IVC criteria.  Patient's labs are largely within normal limits, besides elevated glucose and cannabinoid positive. We will dose metformin and continue to monitor fingerstick. Currently awaiting psychiatry evaluation/disposition.  ____________________________________________   FINAL CLINICAL IMPRESSION(S) / ED DIAGNOSES  Psychiatric evaluation    Minna Antis, MD 07/28/16 1550

## 2016-07-28 NOTE — ED Notes (Signed)
Pt refused metformin. "I don't like that pill, makes me feel sick."  RN informed pt of his elevated blood sugar level.  Pt told this Clinical research associatewriter he does not take any medication for his blood sugar at home. Maintained on 15 minute checks and observation by security camera for safety.

## 2016-07-28 NOTE — Discharge Instructions (Signed)
Please seek medical attention and help for any thoughts about wanting to harm yourself, harm others, any concerning change in behavior, severe depression, inappropriate drug use or any other new or concerning symptoms. ° °

## 2016-07-28 NOTE — ED Notes (Signed)
SOC recommends discharge. EDP made aware. Pt accepting. Pt remains calm and cooperative. Maintained on 15 minute checks and observation by security camera for safety.

## 2016-07-28 NOTE — ED Notes (Signed)

## 2016-07-28 NOTE — ED Provider Notes (Signed)
-----------------------------------------   7:38 PM on 07/28/2016 -----------------------------------------  Patient was seen by psychiatry. They do not feel the patient requires inpatient [place] at this time. No new medication recommendations at this time.   Phineas SemenGoodman, Mahala Rommel, MD 07/28/16 [phone]

## 2016-07-28 NOTE — ED Notes (Signed)
Report was received from Amy B., RN; Pt. Verbalizes no complaints or distress; denies S.I./Hi. Continue to monitor with 15 min. Monitoring. Patient is to be discharged; per S.O.C.

## 2016-07-28 NOTE — ED Provider Notes (Signed)
[facility] Emergency Department Provider Note  ____________________________________________  Time seen: Approximately 9:07 AM  I have reviewed the triage vital signs and the nursing notes.   HISTORY  Chief Complaint Insect Bite   HPI Albert Miranda is a 36 y.o. male who presents to the emergency department for evaluation of insect bite to the abdomen.He states that he noticed the bites yesterday. He states initially the areas itched and burned that now they have calmed down. He is not applied any creams or ointments or taken any medications for this complaint. He was evaluated for the same about a month ago.  Past Medical History:  Diagnosis Date  . Bipolar 1 disorder (HCC)   . Patient denies medical problems     Patient Active Problem List   Diagnosis Date Noted  . Schizoaffective disorder (HCC) 05/01/2016  . Cannabis abuse 05/01/2016  . Noncompliance 05/01/2016    Past Surgical History:  Procedure Laterality Date  . LEG SURGERY      Prior to Admission medications   Medication Sig Start Date End Date Taking? Authorizing Provider  amoxicillin (AMOXIL) 500 MG capsule Take 1 capsule (500 mg total) by mouth 3 (three) times daily. 07/01/16   Irean Hong, MD  ARIPiprazole (ABILIFY) 20 MG tablet Take 20 mg by mouth daily.     [provider]  lamoTRIgine (LAMICTAL) 100 MG tablet Take 100 mg by mouth 2 (two) times daily.    [provider]  magic mouthwash SOLN Take 5 mLs by mouth 3 (three) times daily as needed for mouth pain. 07/01/16   Irean Hong, MD    Allergies Patient has no known allergies.  Family History  Problem Relation Age of Onset  . Diabetes Father     Social History Social History  Substance Use Topics  . Smoking status: Current Every Day Smoker    Packs/day: 1.00    Types: Cigarettes  . Smokeless tobacco: Never Used  . Alcohol use No     Comment: occassional    Review of Systems  Constitutional:  Negative for fever/chills Respiratory: Negative for shortness of breath. Musculoskeletal: Negative for pain. Skin: Positive for lesions Neurological: Negative for headaches, focal weakness or numbness. ____________________________________________   PHYSICAL EXAM:  VITAL SIGNS: ED Triage Vitals [07/28/16 0840]  Enc Vitals Group     BP      Pulse      Resp      Temp      Temp src      SpO2      Weight      Height      Head Circumference      Peak Flow      Pain Score 0     Pain Loc      Pain Edu?      Excl. in GC?      Constitutional: Alert and oriented. Well appearing and in no acute distress. Eyes: Conjunctivae are normal. EOMI. Nose: No congestion/rhinnorhea. Mouth/Throat: Mucous membranes are moist.   Neck: No stridor. Lymphatic: No cervical lymphadenopathy. Cardiovascular: Good peripheral circulation. Respiratory: Normal respiratory effort.  No retractions. Musculoskeletal: FROM throughout. Neurologic:  Normal speech and language. No gross focal neurologic deficits are appreciated. Skin: 2 raised, erythematous areas with a punctate lesion in the center. No fluctuance.   ____________________________________________   LABS (all labs ordered are listed, but only abnormal results are displayed)  Labs Reviewed - No data to display ____________________________________________  EKG  Not indicated.  ____________________________________________  RADIOLOGY  Not indicated. ____________________________________________   PROCEDURES  Procedure(s) performed: None ____________________________________________   INITIAL IMPRESSION / ASSESSMENT AND PLAN / ED COURSE  36 year old male presenting to the emergency department for evaluation of pruritic lesions on his stomach suspected to be insect bites. Areas are not concerning for cellulitis or abscess at this time. He'll be given a prescription for hydrocortisone cream and discharged home. He is instructed to  follow-up with primary care provider for symptoms that are not improving over the next few days. He was encouraged to return to the emergency department for symptoms change or worsen if he is unable to schedule an appointment.  Pertinent labs & imaging results that were available during my care of the patient were reviewed by me and considered in my medical decision making (see chart for details).   ____________________________________________   FINAL CLINICAL IMPRESSION(S) / ED DIAGNOSES  Final diagnoses:  None    New Prescriptions   No medications on file    If controlled substance prescribed during this visit, 12 month history viewed on the [facility] prior to issuing an initial prescription for Schedule II or III opiod.   Note:  This document was prepared using Dragon voice recognition software and may include unintentional dictation errors.    Chinita Pesterriplett, Yesica Kemler B, FNP 07/28/16 [id number]    Minna AntisPaduchowski, Kevin, MD 07/28/16 [phone]

## 2016-08-22 DIAGNOSIS — F3175 Bipolar disorder, in partial remission, most recent episode depressed: Secondary | ICD-10-CM | POA: Diagnosis not present

## 2016-08-31 ENCOUNTER — Emergency Department
Admission: EM | Admit: 2016-08-31 | Discharge: 2016-08-31 | Disposition: A | Discharge status: Home / Self Care | Payer: Medicare Other | Attending: Emergency Medicine | Admitting: Emergency Medicine

## 2016-08-31 ENCOUNTER — Encounter: Payer: Self-pay | Admitting: Emergency Medicine

## 2016-08-31 DIAGNOSIS — Z7984 Long term (current) use of oral hypoglycemic drugs: Secondary | ICD-10-CM | POA: Diagnosis not present

## 2016-08-31 DIAGNOSIS — Z79899 Other long term (current) drug therapy: Secondary | ICD-10-CM | POA: Diagnosis not present

## 2016-08-31 DIAGNOSIS — J029 Acute pharyngitis, unspecified: Secondary | ICD-10-CM | POA: Insufficient documentation

## 2016-08-31 DIAGNOSIS — L259 Unspecified contact dermatitis, unspecified cause: Secondary | ICD-10-CM | POA: Diagnosis not present

## 2016-08-31 DIAGNOSIS — F17210 Nicotine dependence, cigarettes, uncomplicated: Secondary | ICD-10-CM | POA: Insufficient documentation

## 2016-08-31 DIAGNOSIS — R05 Cough: Secondary | ICD-10-CM | POA: Diagnosis present

## 2016-08-31 LAB — POCT RAPID STREP A: Streptococcus, Group A Screen (Direct): NEGATIVE

## 2016-08-31 MED ORDER — HYDROCORTISONE 2.5 % EX OINT: TOPICAL_OINTMENT | Freq: Three times a day (TID) | CUTANEOUS | 0 refills | Status: DC

## 2016-08-31 NOTE — ED Notes (Signed)
Pt. States he noticed 5 red raised marks on calf of rt. Leg.  Pt. States "I think it was a spider".  Pt. Also states having a cough for the past 3 days.  Pt. Throat is red and swollen.

## 2016-08-31 NOTE — ED Provider Notes (Signed)
[facility] Emergency Department Provider Note  Time seen: 3:07 AM  I have reviewed the triage vital signs and the nursing notes.   HISTORY  Chief Complaint Insect Bite and Cough    HPI Albert Miranda is a 36 y.o. male with a past medical history of bipolar, schizophrenia, presents the emergency department with a sore throat for the past 3 days along with a rash to his right lower leg for the past one day. According to the patient for the past 3 days he has had a mild sore throat. Denies any fever or vomiting. Patient states this morning he awoke with a rash to his right lower extremity which is very itchy. He did not know if he got bit by a spider or further was poison ivy. Patient's review systems is otherwise negative.  Past Medical History:  Diagnosis Date  . Bipolar 1 disorder (HCC)   . Patient denies medical problems     Patient Active Problem List   Diagnosis Date Noted  . Schizoaffective disorder (HCC) 05/01/2016  . Cannabis abuse 05/01/2016  . Noncompliance 05/01/2016    Past Surgical History:  Procedure Laterality Date  . LEG SURGERY      Prior to Admission medications   Medication Sig Start Date End Date Taking? Authorizing Provider  amoxicillin (AMOXIL) 500 MG capsule Take 1 capsule (500 mg total) by mouth 3 (three) times daily. Patient not taking: Reported on 07/28/2016 07/01/16   Irean Hong, MD  ARIPiprazole (ABILIFY) 30 MG tablet Take 30 mg by mouth at bedtime. 07/05/16   [provider]  hydrocortisone 2.5 % ointment Apply topically 3 (three) times daily. Patient not taking: Reported on 07/28/2016 07/28/16   Kem Boroughs B, FNP  hydrOXYzine (VISTARIL) 50 MG capsule Take 50 mg by mouth 2 (two) times daily. 07/05/16   [provider]  lamoTRIgine (LAMICTAL) 200 MG tablet Take 200 mg by mouth daily. 07/05/16   [provider]  magic mouthwash SOLN Take 5 mLs by mouth 3 (three) times daily as needed for mouth  pain. Patient not taking: Reported on 07/28/2016 07/01/16   Irean Hong, MD  metFORMIN (GLUCOPHAGE) 500 MG tablet Take 500 mg by mouth 2 (two) times daily. 05/17/16   [provider]    No Known Allergies  Family History  Problem Relation Age of Onset  . Diabetes Father     Social History Social History  Substance Use Topics  . Smoking status: Current Every Day Miranda    Packs/day: 1.00    Types: Cigarettes  . Smokeless tobacco: Never Used  . Alcohol use Yes     Comment: occassional    Review of Systems Constitutional: Negative for fever. ENT: Positive for sore throat 3 days Cardiovascular: Negative for chest pain. Respiratory: Negative for shortness of breath. Negative for cough Gastrointestinal: Negative for abdominal pain Genitourinary: Negative for dysuria. Musculoskeletal: Negative for back pain. Skin: Very small area of rash to the right lower extremity Neurological: Negative for headache All other ROS negative  ____________________________________________   PHYSICAL EXAM:  VITAL SIGNS: ED Triage Vitals [08/31/16 0248]  Enc Vitals Group     BP 124/75     Pulse Rate 81     Resp 18     Temp 98.3 F (36.8 C)     Temp Source Oral     SpO2 94 %     Weight 250 lb (113.4 kg)     Height 5\' 11"  (1.803 m)  Head Circumference      Peak Flow      Pain Score      Pain Loc      Pain Edu?      Excl. in GC?     Constitutional: Alert and oriented. Well appearing and in no distress. Eyes: Normal exam ENT   Head: Normocephalic and atraumatic.   Mouth/Throat: Mucous membranes are moist.Mild pharyngeal erythema. No tonsillar exudates. No unilateral tonsillar hypertrophy. Large tonsils bilaterally. Cardiovascular: Normal rate, regular rhythm. No murmur Respiratory: Normal respiratory effort without tachypnea nor retractions. Breath sounds are clear Gastrointestinal: Soft and nontender. No distention.  Musculoskeletal: Nontender with normal range of  motion in all extremities. No lower extremity tenderness or edema. Neurologic:  Normal speech and language. No gross focal neurologic deficits are appreciated. Skin:  Skin is warm, dry. Patient does have several small areas of rash to the right lower extremity which appeared to be vesicular patient states very itchy, they appear to be and mostly a linear pattern highly suspect poison ivy/contact dermatitis. Psychiatric: Mood and affect are normal.  ____________________________________________   INITIAL IMPRESSION / ASSESSMENT AND PLAN / ED COURSE  Pertinent labs & imaging results that were available during my care of the patient were reviewed by me and considered in my medical decision making (see chart for details).  The patient presents to the emergency department with a sore throat and rash most consistent with contact dermatitis to the right lower extremity. We will treat with a topical cortisone. We will send a strep swab. Patient agreeable to plan.  Rapid strep negative. Patient will be discharged home with supportive care and hydrocortisone for his rash.  ____________________________________________   FINAL CLINICAL IMPRESSION(S) / ED DIAGNOSES  Contact dermatitis/poison ivy Pharyngitis    Minna AntisPaduchowski, Avleen Bordwell, MD 08/31/16 [phone]

## 2016-08-31 NOTE — ED Triage Notes (Signed)
Patient states that he thinks he was bitten by a spider yesterday. Patient with 5 red areas to left lower leg. Patient also states that he has had a cough times three days.

## 2016-09-02 LAB — CULTURE, GROUP A STREP (THRC)

## 2016-09-06 DIAGNOSIS — L02828 Furuncle of other sites: Secondary | ICD-10-CM | POA: Diagnosis not present

## 2016-10-23 ENCOUNTER — Emergency Department
Admission: EM | Admit: 2016-10-23 | Discharge: 2016-10-24 | Disposition: A | Discharge status: Other Acute Inpatient Hospital | Payer: Medicare Other | Attending: Emergency Medicine | Admitting: Emergency Medicine

## 2016-10-23 ENCOUNTER — Encounter: Payer: Self-pay | Admitting: Emergency Medicine

## 2016-10-23 DIAGNOSIS — Z79899 Other long term (current) drug therapy: Secondary | ICD-10-CM | POA: Insufficient documentation

## 2016-10-23 DIAGNOSIS — F29 Unspecified psychosis not due to a substance or known physiological condition: Secondary | ICD-10-CM | POA: Diagnosis not present

## 2016-10-23 DIAGNOSIS — Z91199 Patient's noncompliance with other medical treatment and regimen due to unspecified reason: Secondary | ICD-10-CM

## 2016-10-23 DIAGNOSIS — F250 Schizoaffective disorder, bipolar type: Secondary | ICD-10-CM | POA: Diagnosis not present

## 2016-10-23 DIAGNOSIS — E119 Type 2 diabetes mellitus without complications: Secondary | ICD-10-CM | POA: Diagnosis not present

## 2016-10-23 DIAGNOSIS — Z046 Encounter for general psychiatric examination, requested by authority: Secondary | ICD-10-CM | POA: Diagnosis present

## 2016-10-23 DIAGNOSIS — F259 Schizoaffective disorder, unspecified: Secondary | ICD-10-CM | POA: Diagnosis present

## 2016-10-23 DIAGNOSIS — F17210 Nicotine dependence, cigarettes, uncomplicated: Secondary | ICD-10-CM | POA: Insufficient documentation

## 2016-10-23 DIAGNOSIS — R739 Hyperglycemia, unspecified: Secondary | ICD-10-CM

## 2016-10-23 DIAGNOSIS — F1210 Cannabis abuse, uncomplicated: Secondary | ICD-10-CM | POA: Diagnosis present

## 2016-10-23 DIAGNOSIS — Z9119 Patient's noncompliance with other medical treatment and regimen: Secondary | ICD-10-CM

## 2016-10-23 LAB — COMPREHENSIVE METABOLIC PANEL
ALT: 18 U/L (ref 17–63)
AST: 22 U/L (ref 15–41)
Albumin: 4.1 g/dL (ref 3.5–5.0)
Alkaline Phosphatase: 73 U/L (ref 38–126)
Anion gap: 10 (ref 5–15)
BILIRUBIN TOTAL: 1 mg/dL (ref 0.3–1.2)
BUN: 16 mg/dL (ref 6–20)
CALCIUM: 9.3 mg/dL (ref 8.9–10.3)
CO2: 23 mmol/L (ref 22–32)
Chloride: 108 mmol/L (ref 101–111)
Creatinine, Ser: 1.01 mg/dL (ref 0.61–1.24)
GFR calc Af Amer: >60 mL/min (ref >60)
Glucose, Bld: 173 mg/dL — ABNORMAL HIGH (ref 65–99)
Potassium: 3.5 mmol/L (ref 3.5–5.1)
Sodium: 141 mmol/L (ref 135–145)
TOTAL PROTEIN: 8 g/dL (ref 6.5–8.1)

## 2016-10-23 LAB — CBC
HCT: 45.4 % (ref 40.0–52.0)
Hemoglobin: 15.7 g/dL (ref 13.0–18.0)
MCH: 30.5 pg (ref 26.0–34.0)
MCHC: 34.7 g/dL (ref 32.0–36.0)
MCV: 88.1 fL (ref 80.0–100.0)
Platelets: 289 10*3/uL (ref 150–440)
RBC: 5.15 MIL/uL (ref 4.40–5.90)
RDW: 14.1 % (ref 11.5–14.5)
WBC: 8.2 10*3/uL (ref 3.8–10.6)

## 2016-10-23 LAB — ETHANOL: Alcohol, Ethyl (B): <5 mg/dL

## 2016-10-23 MED ORDER — NICOTINE 21 MG/24HR TD PT24
MEDICATED_PATCH | TRANSDERMAL | Status: AC
  Administered 2016-10-24: 21 mg via TRANSDERMAL
  Filled 2016-10-23: qty 1

## 2016-10-23 MED ORDER — ARIPIPRAZOLE 10 MG PO TABS
30.0000 mg | ORAL_TABLET | Freq: Every day | ORAL | Status: DC
  Administered 2016-10-23: 30 mg via ORAL
  Filled 2016-10-23: qty 2

## 2016-10-23 MED ORDER — NICOTINE 21 MG/24HR TD PT24
21.0000 mg | MEDICATED_PATCH | Freq: Once | TRANSDERMAL | Status: DC
  Administered 2016-10-24: 21 mg via TRANSDERMAL
  Filled 2016-10-23: qty 1

## 2016-10-23 MED ORDER — OLANZAPINE 5 MG PO TBDP
10.0000 mg | ORAL_TABLET | ORAL | Status: AC
Start: 2016-10-23 — End: 2016-10-23
  Administered 2016-10-23: 10 mg via ORAL
  Filled 2016-10-23: qty 2

## 2016-10-23 MED ORDER — METFORMIN HCL 500 MG PO TABS
500.0000 mg | ORAL_TABLET | Freq: Two times a day (BID) | ORAL | Status: DC
  Administered 2016-10-23 – 2016-10-24 (×2): 500 mg via ORAL
  Filled 2016-10-23 (×2): qty 1

## 2016-10-23 MED ORDER — LAMOTRIGINE 25 MG PO TABS
50.0000 mg | ORAL_TABLET | Freq: Every day | ORAL | Status: DC
  Administered 2016-10-23 – 2016-10-24 (×2): 50 mg via ORAL
  Filled 2016-10-23 (×2): qty 2

## 2016-10-23 NOTE — ED Notes (Signed)
Pt given warm blanket.

## 2016-10-23 NOTE — ED Notes (Signed)
Pt standing at door waiting on lunch and something to drink. Pt informed lunch tray has been ordered.

## 2016-10-23 NOTE — ED Notes (Signed)
BEHAVIORAL HEALTH ROUNDING Patient sleeping: No. Patient alert and oriented: yes Behavior appropriate: Yes.  ; If no, describe:  Nutrition and fluids offered: yes Toileting and hygiene offered: Yes  Sitter present: q15 minute observations and security  monitoring Law enforcement present: Yes  ODS  

## 2016-10-23 NOTE — ED Notes (Signed)
Pt given supper tray.

## 2016-10-23 NOTE — ED Notes (Signed)
EKG discontinued, per vitals.

## 2016-10-23 NOTE — BH Assessment (Signed)
[facility]  Patient has been accepted for inpatient psychiatric admission.  Accepting MD:  Dr. Shea Evansunn Call report :  [phone] Patient can [facility] after 10 am to:  [facility] [address] [place], [place] [id number]

## 2016-10-23 NOTE — ED Notes (Signed)
[facility] - Toni came by and reported that this pt  can be admitted to her unit once a bed is assigned and sitters are arranged   

## 2016-10-23 NOTE — ED Notes (Signed)
[facility]als are reassessed and EKG preformed.

## 2016-10-23 NOTE — Consult Note (Signed)
[facility] Consult   Reason for Consult:  Consult for 36 year old man with a long history of mental illness brought to the hospital by local law enforcement Referring Physician:  Rifenbark Patient Identification: PROSPERO MAHNKE MRN:  [id number] Principal Diagnosis: Schizoaffective disorder [facility]) Diagnosis:   Patient Active Problem List   Diagnosis Date Noted  . Elevated blood sugar [R73.9] 10/23/2016  . Schizoaffective disorder ([place]) [F25.9] 05/01/2016  . Cannabis abuse [F12.10] 05/01/2016  . Noncompliance [Z91.19] 05/01/2016    Total Time spent with patient: 1 hour  Subjective:   NICKIE DEREN is a 36 y.o. male patient admitted with "I need to have sex".  HPI:  Patient interviewed chart reviewed. Case discussed with emergency room physician and [name]. This is a 36 year old man well known to the psychiatric service with a long history of chronic mental illness. He was brought to the emergency room today by law enforcement with a report that he had been "exposing himself" in public. I have not been able to get any more detail than that about what he was actually doing. When I asked the patient about it he also would not give me any more information. What he did tell me is that he needs to be back on his medicine. He has been off of that he says for at least a month. He has been out in the streets wandering around for some period of time, I don't think he even knows how much. He says he was staying with a friend but that person put him out recently. He denies that he's been drinking or using drugs. Patient presents as clearly psychotic. He is constantly whispering to himself. Appears to be having ideas of reference or delusions about the television. Thoughts are disorganized. Lots of thought blocking and slowing. He has not been aggressive or threatening here in the emergency room.  Social history: This is a 36 year old man with chronic mental health problems who also has  suffered because of his history of noncompliance. He tells me that he had been staying with a "friend" but that person put him out. He indicates that he's been out in the streets for some period of time although I don't know how accurate that is. He does not appear to be particularly dirty and doesn't smell particularly bad which suggests that maybe he hasn't been out in the street that long. Patient has typically close relationships with both his mother and father. They are divorced but both still been very involved in his care particularly since he has been mentally ill. I asked him when he last was in contact with either of them and he did not know. Charged with anything by the police.  Medical history: Patient has a history of elevated blood sugars but I don't think he has ever actually been formally diagnosed with diabetes. His blood sugar this time is elevated but not as bad as we have seen in the past.  Substance abuse history: He denies that he's been drinking or using any drugs. In the past he has occasionally used marijuana but substance abuse has not been a major part of his illness  Past Psychiatric History: Patient has a history of chronic psychotic disorder. He has been diagnosed variously with bipolar disorder schizoaffective disorder and schizophrenia. He has been on several antipsychotics in the past. Both Abilify and Zyprexa have been useful previously especially when he was on long-acting injectable medicines. I do see some records in the chart that he was  in the emergency room for unrelated medical problems a couple times earlier this year and on neither occasion was he thought to be acutely in need of psychiatric treatment which suggests to me that he had been compliant with his medicine earlier. Suicide attempts. He does have a history of aggression when he has been agitated in the past. Also has a history of being hypersexual when manic in the past which has been disruptive on the  ward.  Risk to Self: Suicidal Ideation: No (Per Triage Note/Chart) Suicidal Intent:  (UTA due to pt presentation/mental status) Is patient at risk for suicide?:  (UTA due to pt presentation/mental status) Suicidal Plan?:  (UTA due to pt presentation/mental status) Access to Means:  (UTA due to pt presentation/mental status) What has been your use of drugs/alcohol within the last 12 months?:  (UTA due to pt presentation/mental status) How many times?:  (UTA due to pt presentation/mental status) Other Self Harm Risks:  (UTA due to pt presentation/mental status) Triggers for Past Attempts:  (UTA due to pt presentation/mental status) Intentional Self Injurious Behavior:  (UTA due to pt presentation/mental status) Risk to Others: Homicidal Ideation:  (UTA due to pt presentation/mental status) Thoughts of Harm to Others:  (UTA due to pt presentation/mental status) Current Homicidal Intent:  (UTA due to pt presentation/mental status) Current Homicidal Plan:  (UTA due to pt presentation/mental status) Access to Homicidal Means:  (UTA due to pt presentation/mental status) History of harm to others?:  (UTA due to pt presentation/mental status) Assessment of Violence:  (UTA due to pt presentation/mental status) Does patient have access to weapons?:  (UTA due to pt presentation/mental status) Criminal Charges Pending?:  (UTA due to pt presentation/mental status) Does patient have a court date:  (UTA due to pt presentation/mental status) Prior Inpatient Therapy: Prior Inpatient Therapy: Yes (Per Chart) Prior Therapy Dates:  (Multiple) Prior Therapy Facilty/Provider(s): UTA due to pt presentation/mental status Reason for Treatment: UTA due to pt presentation/mental status Prior Outpatient Therapy: Prior Outpatient Therapy:  (UTA due to pt presentation/mental status) Does patient have an ACCT team?: Unknown Does patient have Intensive In-House Services?  : Unknown Does patient have [facility] services? :  Unknown Does patient have P4CC services?: Unknown  Past Medical History:  Past Medical History:  Diagnosis Date  . Bipolar 1 disorder ([place])   . Patient denies medical problems     Past Surgical History:  Procedure Laterality Date  . LEG SURGERY     Family History:  Family History  Problem Relation Age of Onset  . Diabetes Father    Family Psychiatric  History: Unknown Social History:  History  Alcohol Use  . Yes    Comment: occassional     History  Drug Use  . Types: Marijuana    Social History   Social History  . Marital status: Single    Spouse name: N/A  . Number of children: N/A  . Years of education: N/A   Social History Main Topics  . Smoking status: Current Every Day Smoker    Packs/day: 1.00    Types: Cigarettes  . Smokeless tobacco: Never Used  . Alcohol use Yes     Comment: occassional  . Drug use: Yes    Types: Marijuana  . Sexual activity: Not Asked   Other Topics Concern  . None   Social History Narrative  . None   Additional Social History:    Allergies:  No Known Allergies  Labs:  Results for orders placed or performed  during the hospital encounter of 10/23/16 (from the past 48 hour(s))  Comprehensive metabolic panel     Status: Abnormal   Collection Time: 10/23/16 12:27 PM  Result Value Ref Range   Sodium 141 135 - 145 mmol/L   Potassium 3.5 3.5 - 5.1 mmol/L   Chloride 108 101 - 111 mmol/L   CO2 23 22 - 32 mmol/L   Glucose, Bld 173 (H) 65 - 99 mg/dL   BUN 16 6 - 20 mg/dL   Creatinine, Ser 1.01 0.61 - 1.24 mg/dL   Calcium 9.3 8.9 - 10.3 mg/dL   Total Protein 8.0 6.5 - 8.1 g/dL   Albumin 4.1 3.5 - 5.0 g/dL   AST 22 15 - 41 U/L   ALT 18 17 - 63 U/L   Alkaline Phosphatase 73 38 - 126 U/L   Total Bilirubin 1.0 0.3 - 1.2 mg/dL   GFR calc non Af Amer >60 >60 mL/min   GFR calc Af Amer >60 >60 mL/min    Comment: (NOTE) The eGFR has been calculated using the CKD EPI equation. This calculation has not been validated in all  clinical situations. eGFR's persistently <60 mL/min signify possible Chronic Kidney Disease.    Anion gap 10 5 - 15  Ethanol     Status: None   Collection Time: 10/23/16 12:27 PM  Result Value Ref Range   Alcohol, Ethyl (B) <5 <5 mg/dL    Comment:        LOWEST DETECTABLE LIMIT FOR SERUM ALCOHOL IS 5 mg/dL FOR MEDICAL PURPOSES ONLY   cbc     Status: None   Collection Time: 10/23/16 12:27 PM  Result Value Ref Range   WBC 8.2 3.8 - 10.6 K/uL   RBC 5.15 4.40 - 5.90 MIL/uL   Hemoglobin 15.7 13.0 - 18.0 g/dL   HCT 45.4 40.0 - 52.0 %   MCV 88.1 80.0 - 100.0 fL   MCH 30.5 26.0 - 34.0 pg   MCHC 34.7 32.0 - 36.0 g/dL   RDW 14.1 11.5 - 14.5 %   Platelets 289 150 - 440 K/uL    Current Facility-Administered Medications  Medication Dose Route Frequency Provider Last Rate Last Dose  . ARIPiprazole (ABILIFY) tablet 30 mg  30 mg Oral QHS Jakyria Bleau, Selmer T, MD      . lamoTRIgine (LAMICTAL) tablet 50 mg  50 mg Oral Daily Morena Mckissack, Amun T, MD      . metFORMIN (GLUCOPHAGE) tablet 500 mg  500 mg Oral BID WC Owain Eckerman, Nezar T, MD      . OLANZapine zydis (ZYPREXA) disintegrating tablet 10 mg  10 mg Oral NOW Ivonna Kinnick, Madie Reno, MD       Current Outpatient Prescriptions  Medication Sig Dispense Refill  . ARIPiprazole (ABILIFY) 30 MG tablet Take 30 mg by mouth at bedtime.  1  . clonazePAM (KLONOPIN) 0.5 MG tablet Take 0.5 mg by mouth 2 (two) times daily.  1  . hydrocortisone 2.5 % ointment Apply topically 3 (three) times daily. 30 g 0  . hydrOXYzine (VISTARIL) 50 MG capsule Take 50 mg by mouth 2 (two) times daily.  1  . lamoTRIgine (LAMICTAL) 200 MG tablet Take 200 mg by mouth daily.  1  . magic mouthwash SOLN Take 5 mLs by mouth 3 (three) times daily as needed for mouth pain. (Patient not taking: Reported on 07/28/2016) 75 mL 0  . metFORMIN (GLUCOPHAGE) 500 MG tablet Take 500 mg by mouth 2 (two) times daily.  0    Musculoskeletal:  Strength & Muscle Tone: within normal limits Gait & Station:  normal Patient leans: N/A  Psychiatric Specialty Exam: Physical Exam  Nursing note and vitals reviewed. Constitutional: He appears well-developed and well-nourished.  HENT:  Head: Normocephalic and atraumatic.  Eyes: Pupils are equal, round, and reactive to light. Conjunctivae are normal.  Neck: Normal range of motion.  Cardiovascular: Regular rhythm and normal heart sounds.   Respiratory: Effort normal.  GI: Soft.  Musculoskeletal: Normal range of motion.  Neurological: He is alert.  Skin: Skin is warm and dry.  Psychiatric: His affect is blunt. His speech is delayed and tangential. He is slowed. Cognition and memory are impaired. He expresses inappropriate judgment. He expresses no homicidal and no suicidal ideation. He is noncommunicative. He exhibits abnormal recent memory.    Review of Systems  Constitutional: Negative.   HENT: Negative.   Eyes: Negative.   Respiratory: Negative.   Cardiovascular: Negative.   Gastrointestinal: Negative.   Musculoskeletal: Negative.   Skin: Negative.   Neurological: Negative.   Psychiatric/Behavioral: Positive for hallucinations and memory loss. Negative for depression, substance abuse and suicidal ideas. The patient has insomnia. The patient is not nervous/anxious.     Blood pressure (!) 133/111, pulse (!) 110, temperature 99 F (37.2 C), temperature source Oral, resp. rate 18, height 5' 11"  (1.803 m), weight 230 lb (104.3 kg), SpO2 97 %.Body mass index is 32.08 kg/m.  General Appearance: Fairly Groomed  Eye Contact:  Minimal  Speech:  Slow  Volume:  Decreased  Mood:  Euthymic  Affect:  Blunt and Constricted  Thought Process:  Disorganized  Orientation:  Negative  Thought Content:  Illogical, Ideas of Reference:   Paranoia, Rumination and Tangential  Suicidal Thoughts:  No  Homicidal Thoughts:  No  Memory:  Immediate;   Fair Recent;   Poor Remote;   Fair  Judgement:  Impaired  Insight:  Shallow  Psychomotor Activity:  Normal   Concentration:  Concentration: Poor  Recall:  [employer] of Knowledge:  Fair  Language:  Fair  Akathisia:  No  Handed:  Right  AIMS (if indicated):     Assets:  Resilience Social Support  ADL's:  Intact  Cognition:  Impaired,  Mild  Sleep:        Treatment Plan Summary: Daily contact with patient to assess and evaluate symptoms and progress in treatment, Medication management and Plan 36 year old man with a history of chronic psychotic disorder presents. Disorganized with lots of thought blocking and reportedly bizarre and disruptive behavior in public. Not able to carry on a lucid conversation. Appears to be off his medicine for some time and unable to take care of himself. Patient will be admitted to the psychiatric unit. If it is necessary to file an involuntary commitment I will do so and I think he probably meets criteria. Patient will be given Zyprexa for now and then I will put him back on his Abilify 30 mg at night and restart lamotrigine at a lower dose since I don't know how long he has been off it. Case reviewed with nursing supervisor from behavioral health and [name]. Patient hopefully can be admitted to the unit downstairs for further treatment. I left a voicemail message for his mother as well.  Disposition: Recommend psychiatric Inpatient admission when medically cleared. Supportive therapy provided about ongoing stressors.  Alethia Berthold, MD 10/23/2016 2:35 PM

## 2016-10-23 NOTE — ED Notes (Signed)

## 2016-10-23 NOTE — ED Provider Notes (Signed)
[facility] Emergency Department Provider Note  ____________________________________________   First MD Initiated Contact with Patient 10/23/16 1250     (approximate)  I have reviewed the triage vital signs and the nursing notes.   HISTORY  Chief Complaint Psychiatric Evaluation  Level V exemption history Limited by the patient's psychiatric illness  HPI Albert Miranda is a 36 y.o. male who comes to the emergency departmentwith [place] police after the patient was found exposing his genitals on public streets. He is uncooperative with the history and exam as he is responding to internal stimuli and does not want to speak to me. He is a long-standing history of schizoaffective disorder and will not tell me whether or not he is taking his medications.   Past Medical History:  Diagnosis Date  . Bipolar 1 disorder ([facility])   . Patient denies medical problems     Patient Active Problem List   Diagnosis Date Noted  . Elevated blood sugar 10/23/2016  . Schizoaffective disorder ([facility]) 05/01/2016  . Cannabis abuse 05/01/2016  . Noncompliance 05/01/2016    Past Surgical History:  Procedure Laterality Date  . LEG SURGERY      Prior to Admission medications   Medication Sig Start Date End Date Taking? Authorizing Provider  ARIPiprazole (ABILIFY) 30 MG tablet Take 30 mg by mouth at bedtime. 07/05/16  Yes [provider]  clonazePAM (KLONOPIN) 0.5 MG tablet Take 0.5 mg by mouth 2 (two) times daily. 09/17/16  Yes [provider]  hydrocortisone 2.5 % ointment Apply topically 3 (three) times daily. 08/31/16  Yes Minna AntisPaduchowski, Kevin, MD  hydrOXYzine (VISTARIL) 50 MG capsule Take 50 mg by mouth 2 (two) times daily. 07/05/16  Yes [provider]  lamoTRIgine (LAMICTAL) 200 MG tablet Take 200 mg by mouth daily. 07/05/16  Yes [provider]  magic mouthwash SOLN Take 5 mLs by mouth 3 (three) times daily as needed for mouth  pain. Patient not taking: Reported on 07/28/2016 07/01/16   Irean HongSung, Jade J, MD  metFORMIN (GLUCOPHAGE) 500 MG tablet Take 500 mg by mouth 2 (two) times daily. 05/17/16   [provider]    Allergies Patient has no known allergies.  Family History  Problem Relation Age of Onset  . Diabetes Father     Social History Social History  Substance Use Topics  . Smoking status: Current Every Day Smoker    Packs/day: 1.00    Types: Cigarettes  . Smokeless tobacco: Never Used  . Alcohol use Yes     Comment: occassional    Review of Systems Level V exemption history Limited by the patient's psychiatric illness ____________________________________________   PHYSICAL EXAM:  VITAL SIGNS: ED Triage Vitals  Enc Vitals Group     BP 10/23/16 1226 (!) 133/111     Pulse Rate 10/23/16 1226 (!) 110     Resp 10/23/16 1226 18     Temp 10/23/16 1226 99 F (37.2 C)     Temp Source 10/23/16 1226 Oral     SpO2 10/23/16 1226 97 %     Weight 10/23/16 1227 230 lb (104.3 kg)     Height 10/23/16 1227 5\' 11"  (1.803 m)     Head Circumference --      Peak Flow --      Pain Score --      Pain Loc --      Pain Edu? --      Excl. in GC? --     Constitutional: Malodorous  and disheveled uncooperative Eyes: EOMI. Head: Atraumatic. Nose: No congestion/rhinnorhea. Mouth/Throat: No trismus Neck: No stridor.   Cardiovascular: Normal rate, regular rhythm. Respiratory: Normal respiratory effort.  No retractions.  Musculoskeletal: No lower extremity edema   Neurologic:  . No gross focal neurologic deficits are appreciated. Skin:  Skin is warm, dry and intact. No rash noted. Psychiatric: Responding to internal stimuli    _________________________________________   LABS (all labs ordered are listed, but only abnormal results are displayed)  Labs Reviewed  COMPREHENSIVE METABOLIC PANEL - Abnormal; Notable for the following:       Result Value   Glucose, Bld 173 (*)    All other components  within normal limits  ETHANOL  CBC  URINE DRUG SCREEN, QUALITATIVE ([facility] ONLY)    Blood work unremarkable __________________________________________  EKG   ____________________________________________  RADIOLOGY   ____________________________________________   PROCEDURES  Procedure(s) performed: no  Procedures  Critical Care performed: no  Observation: no ____________________________________________   INITIAL IMPRESSION / ASSESSMENT AND PLAN / ED COURSE  Pertinent labs & imaging results that were available during my care of the patient were reviewed by me and considered in my medical decision making (see chart for details).  The patient is demonstrating bizarre behavior responding to internal stimuli is clearly actively psychotic. He spoke with Albert Miranda of psychiatry and agrees to stay and as he is voluntary will not require involuntary commitment. At this point he is medically stable for psychiatric evaluation.      ____________________________________________   FINAL CLINICAL IMPRESSION(S) / ED DIAGNOSES  Final diagnoses:  Psychosis, unspecified psychosis type      NEW MEDICATIONS STARTED DURING THIS VISIT:  New Prescriptions   No medications on file     Note:  This document was prepared using Dragon voice recognition software and may include unintentional dictation errors.     Merrily Brittleifenbark, Tyshawn Keel, MD 10/23/16 [phone]

## 2016-10-23 NOTE — ED Provider Notes (Signed)
[facility]   Emily FilbertWilliams, Cheralyn Oliver E, MD 10/23/16 2010

## 2016-10-23 NOTE — BH Assessment (Signed)
[facility], and [facility].

## 2016-10-23 NOTE — ED Notes (Signed)
Pt given sprite 

## 2016-10-23 NOTE — BH Assessment (Signed)
Tele Assessment Note   Albert Miranda is an 36 y.o. male. Presenting involuntarily for assessment. Per report pt was transported to ED via law enforcement.  Per triage:   Pt brought in by [employer] for reports of exposing himself on [address]. Pt is cooperative in triage. Pt states he needs to be evaluated. Pt calm. Pt whispering to himself. When asked if he is hearing or seeing things he states "sure am". Pt does not specify if hallucinations are auditory or visual. Pt denies SI/HI. Pt denies substance abuse. -----  On assessment pt is outfitted in wine scrubs and sitting quietly on the bed. Pt correctly verifies name and date of birth. Pt provides no answers to inquiries with the exception of confirming he is currently not taking any medications. Pt is observed responding to internal stimuli. It is unclear exactly what transpired in regards to pt exposing himself.  Per chart, pt has h/o Schizoaffective d/o.  Diagnosis: Schizoaffective d/o, bipolar type  Past Medical History:  Past Medical History:  Diagnosis Date  . Bipolar 1 disorder (HCC)   . Patient denies medical problems     Past Surgical History:  Procedure Laterality Date  . LEG SURGERY      Family History:  Family History  Problem Relation Age of Onset  . Diabetes Father     Social History:  reports that he has been smoking Cigarettes.  He has been smoking about 1.00 pack per day. He has never used smokeless tobacco. He reports that he drinks alcohol. He reports that he uses drugs, including Marijuana.  Additional Social History:  Alcohol / Drug Use Pain Medications: UTA due to pt presentation/mental status Prescriptions: UTA due to pt presentation/mental status Over the Counter: UTA due to pt presentation/mental status History of alcohol / drug use?: Yes (Per Chart)  CIWA: CIWA-Ar BP: (!) 133/111 Pulse Rate: (!) 110 COWS:    PATIENT STRENGTHS: (choose at least two) Average or above average intelligence General  fund of knowledge  Allergies: No Known Allergies  Home Medications:  (Not in a hospital admission)  OB/GYN Status:  No LMP for male patient.  General Assessment Data Location of Assessment: [facility] ED [name] Assessment: In system Is this a Tele or Face-to-Face Assessment?: Face-to-Face Is this an Initial Assessment or a Re-assessment for this encounter?: Initial Assessment Marital status:  (UTA due to pt presentation/mental status) Is patient pregnant?: No Pregnancy Status: No Living Arrangements: Alone (UTA due to pt presentation/mental status Alone per chart) Can pt return to current living arrangement?:  (UTA due to pt presentation/mental status) Admission Status: Involuntary Is patient capable of signing voluntary admission?: No Referral Source:  Mudlogger) Insurance type: Medicare     Crisis Care Plan Living Arrangements: Alone (UTA due to pt presentation/mental status Alone per chart) Name of Psychiatrist: None Reported (UTA due to pt presentation/mental status) Name of Therapist: None Reported (UTA due to pt presentation/mental status)  Education Status Is patient currently in school?:  (UTA due to pt presentation/mental status) Highest grade of school patient has completed:  (UTA due to pt presentation/mental status)  Risk to self with the past 6 months Suicidal Ideation: No (Per Triage Note/Chart) Has patient been a risk to self within the past 6 months prior to admission? :  (UTA due to pt presentation/mental status) Suicidal Intent:  (UTA due to pt presentation/mental status) Has patient had any suicidal intent within the past 6 months prior to admission? :  (UTA due to pt presentation/mental status)  Is patient at risk for suicide?:  (UTA due to pt presentation/mental status) Suicidal Plan?:  (UTA due to pt presentation/mental status) Has patient had any suicidal plan within the past 6 months prior to admission? :  (UTA due to pt presentation/mental  status) Access to Means:  (UTA due to pt presentation/mental status) What has been your use of drugs/alcohol within the last 12 months?:  (UTA due to pt presentation/mental status) Previous Attempts/Gestures:  (UTA due to pt presentation/mental status) How many times?:  (UTA due to pt presentation/mental status) Other Self Harm Risks:  (UTA due to pt presentation/mental status) Triggers for Past Attempts:  (UTA due to pt presentation/mental status) Intentional Self Injurious Behavior:  (UTA due to pt presentation/mental status) Family Suicide History:  (UTA due to pt presentation/mental status) Recent stressful life event(s):  (UTA due to pt presentation/mental status) Persecutory voices/beliefs?:  (UTA due to pt presentation/mental status) Depression:  (UTA due to pt presentation/mental status) Depression Symptoms:  (UTA due to pt presentation/mental status) Substance abuse history and/or treatment for substance abuse?:  (UTA due to pt presentation/mental status) Suicide prevention information given to non-admitted patients:  (UTA due to pt presentation/mental status)  Risk to Others within the past 6 months Homicidal Ideation:  (UTA due to pt presentation/mental status) Does patient have any lifetime risk of violence toward others beyond the six months prior to admission? :  (UTA due to pt presentation/mental status) Thoughts of Harm to Others:  (UTA due to pt presentation/mental status) Current Homicidal Intent:  (UTA due to pt presentation/mental status) Current Homicidal Plan:  (UTA due to pt presentation/mental status) Access to Homicidal Means:  (UTA due to pt presentation/mental status) History of harm to others?:  (UTA due to pt presentation/mental status) Assessment of Violence:  (UTA due to pt presentation/mental status) Does patient have access to weapons?:  (UTA due to pt presentation/mental status) Criminal Charges Pending?:  (UTA due to pt presentation/mental status) Does  patient have a court date:  (UTA due to pt presentation/mental status) Is patient on probation?:  (UTA due to pt presentation/mental status)  Psychosis Hallucinations: Auditory (Pt observed responding to internal stimuli, uta VTOH) Delusions:  (UTA due to pt presentation/mental status)  Mental Status Report Appearance/Hygiene: In scrubs Eye Contact: Poor Motor Activity: Freedom of movement Speech: Slow Level of Consciousness: Quiet/awake Mood: Euthymic Affect: Flat Anxiety Level: None Thought Processes: Unable to Assess Judgement: Unable to Assess Orientation: Unable to assess Obsessive Compulsive Thoughts/Behaviors: Unable to Assess  Cognitive Functioning Concentration: Unable to Assess Memory: Unable to Assess IQ: Average Insight: Unable to Assess Impulse Control: Unable to Assess Appetite:  (UTA due to pt presentation/mental status) Weight Loss:  (UTA due to pt presentation/mental status) Weight Gain:  (UTA due to pt presentation/mental status) Sleep: Unable to Assess Total Hours of Sleep:  (UTA due to pt presentation/mental status) Vegetative Symptoms: Unable to Assess  ADLScreening [facility] Assessment Services) Patient's cognitive ability adequate to safely complete daily activities?: Yes Patient able to express need for assistance with ADLs?:  (UTA due to pt presentation/mental status) Independently performs ADLs?: Yes (appropriate for developmental age) (Per Chart)  Prior Inpatient Therapy Prior Inpatient Therapy: Yes (Per Chart) Prior Therapy Dates:  (Multiple) Prior Therapy Facilty/Provider(s): UTA due to pt presentation/mental status Reason for Treatment: UTA due to pt presentation/mental status  Prior Outpatient Therapy Prior Outpatient Therapy:  (UTA due to pt presentation/mental status) Does patient have an ACCT team?: Unknown Does patient have Intensive In-House Services?  : Unknown Does patient have  [facility] services? : Unknown Does patient have P4[facility]  services?: Unknown  ADL Screening (condition at time of admission) Patient's cognitive ability adequate to safely complete daily activities?: Yes Is the patient deaf or have difficulty hearing?:  (UTA due to pt presentation/mental status) Does the patient have difficulty seeing, even when wearing glasses/contacts?:  (UTA due to pt presentation/mental status) Does the patient have difficulty concentrating, remembering, or making decisions?: Yes Patient able to express need for assistance with ADLs?:  (UTA due to pt presentation/mental status) Independently performs ADLs?: Yes (appropriate for developmental age) (Per Chart) Does the patient have difficulty walking or climbing stairs?:  (UTA due to pt presentation/mental status) Weakness of Legs:  (UTA due to pt presentation/mental status) Weakness of Arms/Hands:  (UTA due to pt presentation/mental status)  Home Assistive Devices/Equipment Home Assistive Devices/Equipment:  (UTA due to pt presentation/mental status. None per chart.)  Therapy Consults (therapy consults require a physician order) PT Evaluation Needed: No OT Evalulation Needed: No SLP Evaluation Needed: No Abuse/Neglect Assessment (Assessment to be complete while patient is alone) Physical Abuse:  (UTA due to pt presentation/mental status) Verbal Abuse:  (UTA due to pt presentation/mental status) Sexual Abuse:  (UTA due to pt presentation/mental status) Exploitation of patient/patient's resources:  (UTA due to pt presentation/mental status) Self-Neglect:  (UTA due to pt presentation/mental status) Values / Beliefs Cultural Requests During Hospitalization: None Spiritual Requests During Hospitalization: None Consults Spiritual Care Consult Needed: No Social Work Consult Needed: No Merchant navy officerAdvance Directives (For Healthcare) Does Patient Have a Medical Advance Directive?: No (Per Chart) Would patient like information on creating a medical advance directive?: No - Patient declined     Additional Information 1:1 In Past 12 Months?: No CIRT Risk: Yes (Per report) Elopement Risk: No Does patient have medical clearance?: No     Disposition:  Disposition Initial Assessment Completed for this Encounter: Yes Disposition of Patient: Inpatient treatment program Type of inpatient treatment program: Adult (Inpt. Admission per Dr.Clapacs)  Giannis Corpuz J [place] 10/23/2016 1:50 PM

## 2016-10-23 NOTE — ED Triage Notes (Signed)
Pt brought in by [employer] for reports of exposing himself on [address]. Pt is cooperative in triage. Pt states he needs to be evaluated. Pt calm. Pt whispering to himself. When asked if he is hearing or seeing things he states "sure am". Pt does not specify if hallucinations are auditory or visual. Pt denies SI/HI. Pt denies substance abuse.

## 2016-10-23 NOTE — ED Notes (Signed)
Pt. To B[facility] from ED ambulatory without difficulty, to room  [facility]. [facility]. Pt. Is alert and oriented, warm and dry in no distress. Pt. Denies SI, HI, and VH. Pt states having AH but would not tell this writer what the voices are saying.  Pt. Calm and cooperative. Pt was limping when walking. This writer looked at feet patient has blisters bilaterally on feet. Areas are closed with not drainage. This writer placed large band aids on feet to cushion areas. Pt. Made aware of security cameras and Q15 minute rounds. Pt. Encouraged to let Nursing staff know of any concerns or needs.

## 2016-10-24 ENCOUNTER — Telehealth: Payer: Self-pay | Admitting: Emergency Medicine

## 2016-10-24 DIAGNOSIS — F250 Schizoaffective disorder, bipolar type: Secondary | ICD-10-CM | POA: Diagnosis not present

## 2016-10-24 DIAGNOSIS — F29 Unspecified psychosis not due to a substance or known physiological condition: Secondary | ICD-10-CM | POA: Diagnosis not present

## 2016-10-24 DIAGNOSIS — E119 Type 2 diabetes mellitus without complications: Secondary | ICD-10-CM | POA: Diagnosis present

## 2016-10-24 DIAGNOSIS — Z915 Personal history of self-harm: Secondary | ICD-10-CM | POA: Diagnosis not present

## 2016-10-24 DIAGNOSIS — F17210 Nicotine dependence, cigarettes, uncomplicated: Secondary | ICD-10-CM | POA: Diagnosis present

## 2016-10-24 DIAGNOSIS — Z79899 Other long term (current) drug therapy: Secondary | ICD-10-CM | POA: Diagnosis not present

## 2016-10-24 DIAGNOSIS — Z7984 Long term (current) use of oral hypoglycemic drugs: Secondary | ICD-10-CM | POA: Diagnosis not present

## 2016-10-24 DIAGNOSIS — F259 Schizoaffective disorder, unspecified: Secondary | ICD-10-CM | POA: Diagnosis present

## 2016-10-24 LAB — URINE DRUG SCREEN, QUALITATIVE (ARMC ONLY)
AMPHETAMINES, UR SCREEN: NOT DETECTED
Barbiturates, Ur Screen: NOT DETECTED
Benzodiazepine, Ur Scrn: NOT DETECTED
CANNABINOID 50 NG, UR ~~LOC~~: POSITIVE — AB
COCAINE METABOLITE, UR ~~LOC~~: NOT DETECTED
MDMA (ECSTASY) UR SCREEN: NOT DETECTED
Methadone Scn, Ur: NOT DETECTED
OPIATE, UR SCREEN: NOT DETECTED
PHENCYCLIDINE (PCP) UR S: NOT DETECTED
Tricyclic, Ur Screen: NOT DETECTED

## 2016-10-24 NOTE — ED Provider Notes (Signed)
-----------------------------------------   8:56 AM on 10/24/2016 -----------------------------------------   Blood pressure 124/69, pulse 60, temperature 98.1 F (36.7 C), temperature source Oral, resp. rate 20, height 5\' 11"  (1.803 m), weight 104.3 kg (230 lb), SpO2 97 %.  The patient had no acute events since last update.  Calm and cooperative at this time.  Disposition is pending Psychiatry/Behavioral Medicine team recommendations.     Arnaldo NatalMalinda, Hennessy Bartel F, MD 10/24/16 [phone]

## 2016-10-24 NOTE — ED Notes (Signed)
Pt denies SI, HI, and AVH. He minimizes his behavior on arrival and says he "only came here 'cause my feet hurt." Told he was now here under IVC, he was upset and asked to speak with MD. MD notified. Pt says he needs to be discharged because he "just got my check" and "has court on August 10." Although upset, his behavior remained appropriate and he now seems resigned to the knowledge that [facility].

## 2016-10-24 NOTE — ED Notes (Signed)
Patient requesting to leave. EDP DR. Roxan HockeyRobinson made aware. Patient is being IVC'ed per orders.

## 2016-10-24 NOTE — ED Provider Notes (Signed)
-----------------------------------------   12:04 AM on 10/24/2016 -----------------------------------------   Blood pressure 127/78, pulse 64, temperature 98.5 F (36.9 C), temperature source Oral, resp. rate 18, height 5\' 11"  (1.803 m), weight 104.3 kg (230 lb), SpO2 99 %.  The patient had no acute events since last update.  Calm and cooperative at this time.  Disposition is pending Psychiatry/Behavioral Medicine team recommendations.     Willy Eddyobinson, Keerat Denicola, MD 10/24/16 [phone]

## 2016-10-24 NOTE — ED Notes (Signed)
Pt was [facility] Pt was ambulatory and in no acute distress at discharge. [facility] as was [facility].

## 2016-11-10 ENCOUNTER — Emergency Department
Admission: EM | Admit: 2016-11-10 | Discharge: 2016-11-10 | Disposition: A | Discharge status: Home / Self Care | Payer: Medicare Other | Attending: Emergency Medicine | Admitting: Emergency Medicine

## 2016-11-10 ENCOUNTER — Emergency Department: Payer: Medicare Other

## 2016-11-10 ENCOUNTER — Encounter: Payer: Self-pay | Admitting: Emergency Medicine

## 2016-11-10 DIAGNOSIS — Z79899 Other long term (current) drug therapy: Secondary | ICD-10-CM | POA: Diagnosis not present

## 2016-11-10 DIAGNOSIS — F17210 Nicotine dependence, cigarettes, uncomplicated: Secondary | ICD-10-CM | POA: Diagnosis not present

## 2016-11-10 DIAGNOSIS — M79672 Pain in left foot: Secondary | ICD-10-CM | POA: Diagnosis not present

## 2016-11-10 DIAGNOSIS — B353 Tinea pedis: Secondary | ICD-10-CM | POA: Diagnosis not present

## 2016-11-10 DIAGNOSIS — Z7984 Long term (current) use of oral hypoglycemic drugs: Secondary | ICD-10-CM | POA: Diagnosis not present

## 2016-11-10 DIAGNOSIS — S90829A Blister (nonthermal), unspecified foot, initial encounter: Secondary | ICD-10-CM | POA: Diagnosis not present

## 2016-11-10 DIAGNOSIS — B351 Tinea unguium: Secondary | ICD-10-CM | POA: Diagnosis not present

## 2016-11-10 MED ORDER — CEPHALEXIN 500 MG PO CAPS: 500.0000 mg | ORAL_CAPSULE | Freq: Three times a day (TID) | ORAL | 0 refills | Status: DC

## 2016-11-10 MED ORDER — NAPROXEN 500 MG PO TABS: 500.0000 mg | ORAL_TABLET | Freq: Two times a day (BID) | ORAL | 0 refills | Status: DC

## 2016-11-10 NOTE — ED Notes (Signed)
Patient to stat desk via wheelchair by EMS for blister on left foot under 2nd toe for 2 days.  Vitals signs within normal limits per EMS.

## 2016-11-10 NOTE — ED Provider Notes (Signed)
[facility] Emergency Department Provider Note   ____________________________________________   First MD Initiated Contact with Patient 11/10/16 [phone]     (approximate)  I have reviewed the triage vital signs and the nursing notes.   HISTORY  Chief Complaint Foot Pain   HPI Albert Miranda is a 36 y.o. male is brought in today by EMS with complaint of foot pain. He states that for 2 days his left second and third toe has been hurting and now his swelling. He states he has been  walking a lot in his tennis shoes. He denies any injury. He rates his pain as an 8 out of 10.   Past Medical History:  Diagnosis Date  . Bipolar 1 disorder (HCC)   . Patient denies medical problems     Patient Active Problem List   Diagnosis Date Noted  . Elevated blood sugar 10/23/2016  . Schizoaffective disorder (HCC) 05/01/2016  . Cannabis abuse 05/01/2016  . Noncompliance 05/01/2016    Past Surgical History:  Procedure Laterality Date  . LEG SURGERY      Prior to Admission medications   Medication Sig Start Date End Date Taking? Authorizing Provider  ARIPiprazole (ABILIFY) 30 MG tablet Take 30 mg by mouth at bedtime. 07/05/16   [provider]  cephALEXin (KEFLEX) 500 MG capsule Take 1 capsule (500 mg total) by mouth 3 (three) times daily. 11/10/16   Tommi Rumps, PA-C  clonazePAM (KLONOPIN) 0.5 MG tablet Take 0.5 mg by mouth 2 (two) times daily. 09/17/16   [provider]  hydrocortisone 2.5 % ointment Apply topically 3 (three) times daily. 08/31/16   Minna Antis, MD  hydrOXYzine (VISTARIL) 50 MG capsule Take 50 mg by mouth 2 (two) times daily. 07/05/16   [provider]  lamoTRIgine (LAMICTAL) 200 MG tablet Take 200 mg by mouth daily. 07/05/16   [provider]  metFORMIN (GLUCOPHAGE) 500 MG tablet Take 500 mg by mouth 2 (two) times daily. 05/17/16   [provider]    Allergies Patient has no known  allergies.  Family History  Problem Relation Age of Onset  . Diabetes Father     Social History Social History  Substance Use Topics  . Smoking status: Current Every Day Smoker    Packs/day: 1.00    Types: Cigarettes  . Smokeless tobacco: Never Used  . Alcohol use Yes     Comment: occassional    Review of Systems Constitutional: No fever/chills Cardiovascular: Denies chest pain. Respiratory: Denies shortness of breath. Musculoskeletal: Positive left foot pain. Skin: Negative for rash. Neurological: Negative for focal weakness or numbness. ____________________________________________   PHYSICAL EXAM:  VITAL SIGNS: ED Triage Vitals  Enc Vitals Group     BP 11/10/16 0634 128/82     Pulse Rate 11/10/16 0634 72     Resp 11/10/16 0634 18     Temp 11/10/16 0634 98.6 F (37 C)     Temp Source 11/10/16 0634 Oral     SpO2 11/10/16 0634 96 %     Weight 11/10/16 0634 230 lb (104.3 kg)     Height 11/10/16 0634 5\' 11"  (1.803 m)     Head Circumference --      Peak Flow --      Pain Score 11/10/16 0633 8     Pain Loc --      Pain Edu? --      Excl. in GC? --     Constitutional: Alert and oriented. Well appearing  and in no acute distress. Eyes: Conjunctivae are normal. PERRL. EOMI. Head: Atraumatic. Neck: No stridor.   Cardiovascular: Normal rate, regular rhythm. Grossly normal heart sounds.  Good peripheral circulation. Respiratory: Normal respiratory effort.  No retractions. Lungs CTAB. Gastrointestinal: Soft and nontender. No distention. No abdominal bruits. No CVA tenderness. Musculoskeletal: Examination of the left foot there is no gross deformity however there is moderate fungal infection of the toenails. There is tenderness and some minimal soft tissue swelling of the second and third toes. There is active athlete's feet but no open skin and no drainage is noted. Neurologic:  Normal speech and language. No gross focal neurologic deficits are appreciated. No gait  instability. Skin:  Skin is warm, dry and intact. Positive athlete's feet. Psychiatric: Mood and affect are normal. Speech and behavior are normal.  ____________________________________________   LABS (all labs ordered are listed, but only abnormal results are displayed)  Labs Reviewed - No data to display  RADIOLOGY  Dg Foot Complete Left  Result Date: 11/10/2016 CLINICAL DATA:  Blister and foot pain for 2 days. EXAM: LEFT FOOT - COMPLETE 3+ VIEW COMPARISON:  None. FINDINGS: There is no evidence of fracture or dislocation. No opaque foreign body or soft tissue emphysema. Mild spurring at the first MTP joint. IMPRESSION: No acute osseous finding or opaque foreign body. Electronically Signed   By: Marnee Spring M.D.   On: 11/10/2016 07:28    ____________________________________________   PROCEDURES  Procedure(s) performed: None  Procedures  Critical Care performed: No  ____________________________________________   INITIAL IMPRESSION / ASSESSMENT AND PLAN / ED COURSE  Pertinent labs & imaging results that were available during my care of the patient were reviewed by me and considered in my medical decision making (see chart for details).  Patient is follow-up with Dr. Charlette Caffey about his feet. He has been using Lamisil or Lotrimin cream twice a day to his athlete's feet. He'll also take Tylenol or ibuprofen as needed for pain. He was given a prescription for Keflex 500 mg 3 times a day for 7 days in the event that this is early skin infection secondary to his athlete's feet.   ____________________________________________   FINAL CLINICAL IMPRESSION(S) / ED DIAGNOSES  Final diagnoses:  Foot pain, left  Athlete's foot on left  Nail fungus      NEW MEDICATIONS STARTED DURING THIS VISIT:  Discharge Medication List as of 11/10/2016  7:39 AM    START taking these medications   Details  cephALEXin (KEFLEX) 500 MG capsule Take 1 capsule (500 mg total) by mouth 3  (three) times daily., Starting Sun 11/10/2016, Print         Note:  This document was prepared using Dragon voice recognition software and may include unintentional dictation errors.    Tommi Rumps, PA-C 11/10/16 1404    Phineas Semen, MD 11/10/16 [phone]

## 2016-11-10 NOTE — ED Triage Notes (Signed)
Patient brought in by ems. Patient with complaint of pain to his left 2nd and 3rd toes times two days. Patient denies injury states that he has been doing a lot of walking.

## 2016-11-10 NOTE — Discharge Instructions (Signed)
Follow-up with Dr. Charlette Caffey about your feet. Begin using Lamisil or Lotrimin cream on your feet twice a day for athlete's feet. You may also take Tylenol or ibuprofen as needed for pain. Begin taking Keflex 500 mg 3 times a day for 7 days.

## 2016-11-12 ENCOUNTER — Encounter: Payer: Self-pay | Admitting: *Deleted

## 2016-11-12 ENCOUNTER — Emergency Department
Admission: EM | Admit: 2016-11-12 | Discharge: 2016-11-12 | Disposition: A | Discharge status: Home / Self Care | Payer: Medicare Other | Attending: Emergency Medicine | Admitting: Emergency Medicine

## 2016-11-12 DIAGNOSIS — Z79899 Other long term (current) drug therapy: Secondary | ICD-10-CM | POA: Insufficient documentation

## 2016-11-12 DIAGNOSIS — S90829A Blister (nonthermal), unspecified foot, initial encounter: Secondary | ICD-10-CM | POA: Diagnosis not present

## 2016-11-12 DIAGNOSIS — F17210 Nicotine dependence, cigarettes, uncomplicated: Secondary | ICD-10-CM | POA: Insufficient documentation

## 2016-11-12 DIAGNOSIS — M79672 Pain in left foot: Secondary | ICD-10-CM

## 2016-11-12 DIAGNOSIS — X58XXXA Exposure to other specified factors, initial encounter: Secondary | ICD-10-CM | POA: Insufficient documentation

## 2016-11-12 DIAGNOSIS — S90822A Blister (nonthermal), left foot, initial encounter: Secondary | ICD-10-CM

## 2016-11-12 DIAGNOSIS — S90821A Blister (nonthermal), right foot, initial encounter: Secondary | ICD-10-CM | POA: Insufficient documentation

## 2016-11-12 DIAGNOSIS — Y929 Unspecified place or not applicable: Secondary | ICD-10-CM | POA: Diagnosis not present

## 2016-11-12 DIAGNOSIS — Y939 Activity, unspecified: Secondary | ICD-10-CM | POA: Diagnosis not present

## 2016-11-12 DIAGNOSIS — T25222A Burn of second degree of left foot, initial encounter: Secondary | ICD-10-CM | POA: Diagnosis not present

## 2016-11-12 DIAGNOSIS — T310 Burns involving less than 10% of body surface: Secondary | ICD-10-CM | POA: Diagnosis not present

## 2016-11-12 DIAGNOSIS — Y999 Unspecified external cause status: Secondary | ICD-10-CM | POA: Diagnosis not present

## 2016-11-12 MED ORDER — ACETAMINOPHEN 325 MG PO TABS
ORAL_TABLET | ORAL | Status: AC
  Filled 2016-11-12: qty 1

## 2016-11-12 MED ORDER — CLINDAMYCIN HCL 150 MG PO CAPS
300.0000 mg | ORAL_CAPSULE | Freq: Once | ORAL | Status: AC
  Administered 2016-11-12: 300 mg via ORAL
  Filled 2016-11-12: qty 2

## 2016-11-12 MED ORDER — ACETAMINOPHEN 325 MG PO TABS
650.0000 mg | ORAL_TABLET | Freq: Once | ORAL | Status: AC
  Administered 2016-11-12: 650 mg via ORAL
  Filled 2016-11-12: qty 2

## 2016-11-12 MED ORDER — ETODOLAC 200 MG PO CAPS: 200.0000 mg | ORAL_CAPSULE | Freq: Three times a day (TID) | ORAL | 0 refills | Status: DC

## 2016-11-12 MED ORDER — KETOROLAC TROMETHAMINE 60 MG/2ML IM SOLN
60.0000 mg | Freq: Once | INTRAMUSCULAR | Status: AC
  Administered 2016-11-12: 60 mg via INTRAMUSCULAR
  Filled 2016-11-12: qty 2

## 2016-11-12 NOTE — ED Triage Notes (Signed)
Pt presents via EMS w/ c/o increased pain and swelling in area of blister. Pt states he did not get abx rx filled until yesterday and took 3 doses. Pt also states he poked the blister with something metal and blood and pus drained from blister. Pt is intermittently falling asleep during triage.

## 2016-11-12 NOTE — ED Provider Notes (Signed)
[facility] Emergency Department Provider Note   ____________________________________________   First MD Initiated Contact with Patient 11/12/16 [phone]     (approximate)  I have reviewed the triage vital signs and the nursing notes.   HISTORY  Chief Complaint Wound Infection    HPI Albert Miranda is a 36 y.o. male who comes into the hospital today with some foot pain. The patient was here on Sunday and reports that he had been walking around for a month. He reports he has blisters on his feet and he has pain under his left foot. He states that his foot started swelling from walking. He states that he came here and then went back to the homeless shelter.The patient states that he picked up the medicine today but he did pop the blister under his left foot. He reports that it drained some pus and blood and he did take the medicine. The pain initially went away but he reports it is coming back so he is unsure if the medicine is making it worse. The patient reports he is here for further evaluation. He also wants to know if the blister can be drained some more. The patient rates his pain a 2 out of 10 in intensity currently but is worse when he steps on it. He didn't take anything for pain at home.   Past Medical History:  Diagnosis Date  . Bipolar 1 disorder (HCC)   . Patient denies medical problems     Patient Active Problem List   Diagnosis Date Noted  . Elevated blood sugar 10/23/2016  . Schizoaffective disorder (HCC) 05/01/2016  . Cannabis abuse 05/01/2016  . Noncompliance 05/01/2016    Past Surgical History:  Procedure Laterality Date  . LEG SURGERY      Prior to Admission medications   Medication Sig Start Date End Date Taking? Authorizing Provider  ARIPiprazole (ABILIFY) 30 MG tablet Take 30 mg by mouth at bedtime. 07/05/16   [provider]  cephALEXin (KEFLEX) 500 MG capsule Take 1 capsule (500 mg total) by mouth 3 (three) times  daily. 11/10/16   Tommi Rumps, PA-C  clonazePAM (KLONOPIN) 0.5 MG tablet Take 0.5 mg by mouth 2 (two) times daily. 09/17/16   [provider]  etodolac (LODINE) 200 MG capsule Take 1 capsule (200 mg total) by mouth every 8 (eight) hours. 11/12/16   Rebecka Apley, MD  hydrocortisone 2.5 % ointment Apply topically 3 (three) times daily. 08/31/16   Minna Antis, MD  hydrOXYzine (VISTARIL) 50 MG capsule Take 50 mg by mouth 2 (two) times daily. 07/05/16   [provider]  lamoTRIgine (LAMICTAL) 200 MG tablet Take 200 mg by mouth daily. 07/05/16   [provider]  metFORMIN (GLUCOPHAGE) 500 MG tablet Take 500 mg by mouth 2 (two) times daily. 05/17/16   [provider]    Allergies Patient has no known allergies.  Family History  Problem Relation Age of Onset  . Diabetes Father     Social History Social History  Substance Use Topics  . Smoking status: Current Every Day Smoker    Packs/day: 1.00    Types: Cigarettes  . Smokeless tobacco: Never Used  . Alcohol use Yes     Comment: occassional    Review of Systems  Constitutional: No fever/chills Eyes: No visual changes. ENT: No sore throat. Cardiovascular: Denies chest pain. Respiratory: Denies shortness of breath. Gastrointestinal: No abdominal pain.  No nausea, no vomiting.  No diarrhea.  No  constipation. Genitourinary: Negative for dysuria. Musculoskeletal: Left foot pain Skin: Blisters some to the plantar surface below the second toe Neurological: Negative for headaches, focal weakness or numbness.   ____________________________________________   PHYSICAL EXAM:  VITAL SIGNS: ED Triage Vitals  Enc Vitals Group     BP 11/12/16 0332 131/78     Pulse Rate 11/12/16 0332 82     Resp 11/12/16 0332 16     Temp 11/12/16 0332 98.9 F (37.2 C)     Temp Source 11/12/16 0332 Oral     SpO2 11/12/16 0332 96 %     Weight --      Height --      Head Circumference --      Peak Flow --       Pain Score 11/12/16 0330 2     Pain Loc --      Pain Edu? --      Excl. in GC? --     Constitutional: Alert and oriented. Well appearing and in Mild distress. Eyes: Conjunctivae are normal. PERRL. EOMI. Head: Atraumatic. Nose: No congestion/rhinnorhea. Mouth/Throat: Mucous membranes are moist.  Oropharynx non-erythematous. Cardiovascular: Normal rate, regular rhythm. Grossly normal heart sounds.  Good peripheral circulation. Respiratory: Normal respiratory effort.  No retractions. Lungs CTAB. Gastrointestinal: Soft and nontender. No distention. Positive bowel sounds Musculoskeletal: No lower extremity tenderness nor edema.   Neurologic:  Normal speech and language.  Skin:  Skin is warm, dry and intact. Blisters/callous noted to the plantar surface of left foot below the second toe there is a laceration to the middle of the blister with no active drainage at this time. Psychiatric: Mood and affect are normal.   ____________________________________________   LABS (all labs ordered are listed, but only abnormal results are displayed)  Labs Reviewed - No data to display ____________________________________________  EKG  none ____________________________________________  RADIOLOGY  No results found.  ____________________________________________   PROCEDURES  Procedure(s) performed: None  Procedures  Critical Care performed: No  ____________________________________________   INITIAL IMPRESSION / ASSESSMENT AND PLAN / ED COURSE  Pertinent labs & imaging results that were available during my care of the patient were reviewed by me and considered in my medical decision making (see chart for details).  This is a 36 year old male who comes into the hospital today with some pain to his left foot as well as a blister with swelling. The patient only took his antibiotics today. It appears that he did pop the blister as there is no fluctuance at this time and no erythema.  I will give the patient a dose of clindamycin orally as well as a shot of Toradol and some Tylenol. The patient is sleeping at this time. I will reassess the patient once he receives his medication.     The patient will be discharged to follow-up with his primary care physician. ____________________________________________   FINAL CLINICAL IMPRESSION(S) / ED DIAGNOSES  Final diagnoses:  Left foot pain  Friction blisters of sole of left foot, initial encounter      NEW MEDICATIONS STARTED DURING THIS VISIT:  New Prescriptions   ETODOLAC (LODINE) 200 MG CAPSULE    Take 1 capsule (200 mg total) by mouth every 8 (eight) hours.     Note:  This document was prepared using Dragon voice recognition software and may include unintentional dictation errors.    Rebecka Apley, MD 11/12/16 [phone]

## 2016-11-12 NOTE — Discharge Instructions (Signed)
Please continue taking the antibiotics you were given previously. Please follow up with your primary care physician.

## 2016-11-12 NOTE — ED Notes (Signed)
Esign not working. Pt verbalized understanding of dc instructions, importance of followup and continuing to take abx that were previously given to him.

## 2016-11-16 ENCOUNTER — Emergency Department
Admission: EM | Admit: 2016-11-16 | Discharge: 2016-11-17 | Disposition: A | Discharge status: Home / Self Care | Payer: Medicare Other | Attending: Emergency Medicine | Admitting: Emergency Medicine

## 2016-11-16 ENCOUNTER — Encounter: Payer: Self-pay | Admitting: Emergency Medicine

## 2016-11-16 DIAGNOSIS — F209 Schizophrenia, unspecified: Secondary | ICD-10-CM

## 2016-11-16 DIAGNOSIS — Z79899 Other long term (current) drug therapy: Secondary | ICD-10-CM | POA: Diagnosis not present

## 2016-11-16 DIAGNOSIS — R440 Auditory hallucinations: Secondary | ICD-10-CM | POA: Diagnosis present

## 2016-11-16 DIAGNOSIS — F17210 Nicotine dependence, cigarettes, uncomplicated: Secondary | ICD-10-CM | POA: Insufficient documentation

## 2016-11-16 DIAGNOSIS — E860 Dehydration: Secondary | ICD-10-CM | POA: Diagnosis not present

## 2016-11-16 DIAGNOSIS — R531 Weakness: Secondary | ICD-10-CM | POA: Diagnosis not present

## 2016-11-16 LAB — CBC WITH DIFFERENTIAL/PLATELET
BASOS ABS: 0.2 10*3/uL — AB (ref 0–0.1)
Basophils Relative: 2 %
EOS ABS: 0.1 10*3/uL (ref 0–0.7)
EOS PCT: 1 %
HEMATOCRIT: 45.3 % (ref 40.0–52.0)
Hemoglobin: 15.8 g/dL (ref 13.0–18.0)
LYMPHS ABS: 3.6 10*3/uL (ref 1.0–3.6)
Lymphocytes Relative: 37 %
MCH: 31 pg (ref 26.0–34.0)
MCHC: 34.9 g/dL (ref 32.0–36.0)
MCV: 88.9 fL (ref 80.0–100.0)
Monocytes Absolute: 0.5 10*3/uL (ref 0.2–1.0)
Monocytes Relative: 5 %
Neutro Abs: 5.3 10*3/uL (ref 1.4–6.5)
Neutrophils Relative %: 55 %
PLATELETS: 304 10*3/uL (ref 150–440)
RBC: 5.09 MIL/uL (ref 4.40–5.90)
RDW: 13.8 % (ref 11.5–14.5)
WBC: 9.7 10*3/uL (ref 3.8–10.6)

## 2016-11-16 LAB — URINALYSIS, COMPLETE (UACMP) WITH MICROSCOPIC
BACTERIA UA: NONE SEEN
BILIRUBIN URINE: NEGATIVE
Glucose, UA: 50 mg/dL — AB
HGB URINE DIPSTICK: NEGATIVE
KETONES UR: NEGATIVE mg/dL
LEUKOCYTES UA: NEGATIVE
NITRITE: NEGATIVE
Protein, ur: NEGATIVE mg/dL
Specific Gravity, Urine: 1.02 (ref 1.005–1.030)
pH: 5 (ref 5.0–8.0)

## 2016-11-16 LAB — COMPREHENSIVE METABOLIC PANEL
ALBUMIN: 4 g/dL (ref 3.5–5.0)
ALT: 18 U/L (ref 17–63)
AST: 20 U/L (ref 15–41)
Alkaline Phosphatase: 71 U/L (ref 38–126)
Anion gap: 9 (ref 5–15)
BUN: 16 mg/dL (ref 6–20)
CHLORIDE: 102 mmol/L (ref 101–111)
CO2: 28 mmol/L (ref 22–32)
Calcium: 9.4 mg/dL (ref 8.9–10.3)
Creatinine, Ser: 1.1 mg/dL (ref 0.61–1.24)
GFR calc Af Amer: >60 mL/min (ref >60)
GFR calc non Af Amer: >60 mL/min (ref >60)
GLUCOSE: 237 mg/dL — AB (ref 65–99)
POTASSIUM: 4 mmol/L (ref 3.5–5.1)
SODIUM: 139 mmol/L (ref 135–145)
Total Bilirubin: 0.8 mg/dL (ref 0.3–1.2)
Total Protein: 8 g/dL (ref 6.5–8.1)

## 2016-11-16 LAB — URINE DRUG SCREEN, QUALITATIVE (ARMC ONLY)
Amphetamines, Ur Screen: NOT DETECTED
BARBITURATES, UR SCREEN: NOT DETECTED
BENZODIAZEPINE, UR SCRN: NOT DETECTED
CANNABINOID 50 NG, UR ~~LOC~~: POSITIVE — AB
Cocaine Metabolite,Ur ~~LOC~~: NOT DETECTED
MDMA (Ecstasy)Ur Screen: NOT DETECTED
METHADONE SCREEN, URINE: NOT DETECTED
OPIATE, UR SCREEN: NOT DETECTED
Phencyclidine (PCP) Ur S: NOT DETECTED
TRICYCLIC, UR SCREEN: NOT DETECTED

## 2016-11-16 LAB — ETHANOL: Alcohol, Ethyl (B): <5 mg/dL (ref <5)

## 2016-11-16 LAB — TROPONIN I: Troponin I: <0.03 ng/mL (ref <0.03)

## 2016-11-16 NOTE — ED Notes (Signed)
Patient dressed out by this RN, urine sent to lab and brought back by [place], EDT

## 2016-11-16 NOTE — ED Triage Notes (Signed)
Pt arrives via ems. Pt states he is here because "i'm hearing things, but nothing bad". Pt states he is supposed to take abilify, klonopin, lamictal but is not taking them. Pt states he feels sleep deprived. Pt also states he had a syncopal episode and chest pain earlier today. Pt denies SI or HI.

## 2016-11-16 NOTE — ED Provider Notes (Signed)
[facility] Emergency Department Provider Note  ____________________________________________   I have reviewed the triage vital signs and the nursing notes.   HISTORY  Chief Complaint Psychiatric Evaluation    HPI NATURE Albert Miranda is a 36 y.o. male history of bipolarand schizoaffective disorder, states that he has not been taking his medications any hearing more voices than usual. He denies any SI or HI, he is having no command hallucinations to hurt anyone.     Past Medical History:  Diagnosis Date  . Bipolar 1 disorder (HCC)   . Patient denies medical problems     Patient Active Problem List   Diagnosis Date Noted  . Elevated blood sugar 10/23/2016  . Schizoaffective disorder (HCC) 05/01/2016  . Cannabis abuse 05/01/2016  . Noncompliance 05/01/2016    Past Surgical History:  Procedure Laterality Date  . LEG SURGERY      Prior to Admission medications   Medication Sig Start Date End Date Taking? Authorizing Provider  ARIPiprazole (ABILIFY) 30 MG tablet Take 30 mg by mouth at bedtime. 07/05/16   [provider]  cephALEXin (KEFLEX) 500 MG capsule Take 1 capsule (500 mg total) by mouth 3 (three) times daily. 11/10/16   Tommi Rumps, PA-C  clonazePAM (KLONOPIN) 0.5 MG tablet Take 0.5 mg by mouth 2 (two) times daily. 09/17/16   [provider]  etodolac (LODINE) 200 MG capsule Take 1 capsule (200 mg total) by mouth every 8 (eight) hours. 11/12/16   Rebecka Apley, MD  hydrocortisone 2.5 % ointment Apply topically 3 (three) times daily. 08/31/16   Minna Antis, MD  hydrOXYzine (VISTARIL) 50 MG capsule Take 50 mg by mouth 2 (two) times daily. 07/05/16   [provider]  lamoTRIgine (LAMICTAL) 200 MG tablet Take 200 mg by mouth daily. 07/05/16   [provider]  metFORMIN (GLUCOPHAGE) 500 MG tablet Take 500 mg by mouth 2 (two) times daily. 05/17/16   [provider]    Allergies Patient has no known  allergies.  Family History  Problem Relation Age of Onset  . Diabetes Father     Social History Social History  Substance Use Topics  . Smoking status: Current Every Day Smoker    Packs/day: 1.00    Types: Cigarettes  . Smokeless tobacco: Never Used  . Alcohol use Yes     Comment: occassional    Review of Systems Constitutional: No fever/chills Eyes: No visual changes. ENT: No sore throat. No stiff neck no neck pain Cardiovascular: Denies chest pain. Respiratory: Denies shortness of breath. Gastrointestinal:   no vomiting.  No diarrhea.  No constipation. Genitourinary: Negative for dysuria. Musculoskeletal: Negative lower extremity swelling Skin: Negative for rash. Neurological: Negative for severe headaches, focal weakness or numbness.   ____________________________________________   PHYSICAL EXAM:  VITAL SIGNS: ED Triage Vitals  Enc Vitals Group     BP 11/16/16 1954 108/79     Pulse Rate 11/16/16 1954 92     Resp 11/16/16 1954 18     Temp 11/16/16 1954 98.7 F (37.1 C)     Temp Source 11/16/16 1954 Oral     SpO2 11/16/16 1954 97 %     Weight 11/16/16 1954 235 lb (106.6 kg)     Height 11/16/16 1954 5\' 11"  (1.803 m)     Head Circumference --      Peak Flow --      Pain Score 11/16/16 1955 0     Pain Loc --  Pain Edu? --      Excl. in GC? --     Constitutional: Alert and oriented. Well appearing and in no acute distress. Eyes: Conjunctivae are normal Head: Atraumatic HEENT: No congestion/rhinnorhea. Mucous membranes are moist.  Oropharynx non-erythematous Neck:   Nontender with no meningismus, no masses, no stridor Cardiovascular: Normal rate, regular rhythm. Grossly normal heart sounds.  Good peripheral circulation. Respiratory: Normal respiratory effort.  No retractions. Lungs CTAB. Abdominal: Soft and nontender. No distention. No guarding no rebound Back:  There is no focal tenderness or step off.  there is no midline tenderness there are no  lesions noted. there is no CVA tenderness  Musculoskeletal: No lower extremity tenderness, no upper extremity tenderness. No joint effusions, no DVT signs strong distal pulses no edema Neurologic:  Normal speech and language. No gross focal neurologic deficits are appreciated.  Skin:  Skin is warm, dry and intact. No rash noted. Psychiatric: Mood and affect are normal. Speech and behavior are normal.  ____________________________________________   LABS (all labs ordered are listed, but only abnormal results are displayed)  Labs Reviewed  CBC WITH DIFFERENTIAL/PLATELET - Abnormal; Notable for the following:       Result Value   Basophils Absolute 0.2 (*)    All other components within normal limits  COMPREHENSIVE METABOLIC PANEL - Abnormal; Notable for the following:    Glucose, Bld 237 (*)    All other components within normal limits  URINALYSIS, COMPLETE (UACMP) WITH MICROSCOPIC - Abnormal; Notable for the following:    Color, Urine YELLOW (*)    APPearance CLEAR (*)    Glucose, UA 50 (*)    Squamous Epithelial / LPF 0-5 (*)    All other components within normal limits  URINE DRUG SCREEN, QUALITATIVE ([facility] ONLY) - Abnormal; Notable for the following:    Cannabinoid 50 Ng, Ur [place] POSITIVE (*)    All other components within normal limits  TROPONIN I  ETHANOL   ____________________________________________  EKG  I personally interpreted any EKGs ordered by me or triage  ____________________________________________  RADIOLOGY  I reviewed any imaging ordered by me or triage that were performed during my shift and, if possible, patient and/or family made aware of any abnormal findings. ____________________________________________   PROCEDURES  Procedure(s) performed: None  Procedures  Critical Care performed: None  ____________________________________________   INITIAL IMPRESSION / ASSESSMENT AND PLAN / ED COURSE  Pertinent labs & imaging results that were  available during my care of the patient were reviewed by me and considered in my medical decision making (see chart for details).  Patient here for help with his schizophrenia. We will have psychiatry evaluate him. He has hearing voices but he is not presenting with SI or HI clinically does not appear to have any acute issues. Signed out at the end of my shift [name]    ____________________________________________   FINAL CLINICAL IMPRESSION(S) / ED DIAGNOSES  Final diagnoses:  None      This chart was dictated using voice recognition software.  Despite best efforts to proofread,  errors can occur which can change meaning.      Jeanmarie Plant, MD 11/16/16 [phone]

## 2016-11-16 NOTE — ED Notes (Signed)
Pt had arrived via EMS about 15 minutes ago; was ambulatory to stat registration; EMS had been called out to a public place for a pt c/o chest pain; pt reported to EMS he had chest pain earlier in the day but was currently feeling fine; at the time of arrival, pt decided not to check in to be seen; he went outside for about 15 minutes and then came back in; when he returned pt said he was checking in to "have the doctor check out his brain"; c/o depression and racing thoughts; pt says he's homeless and hasn't taken his psych meds in days; calm and cooperative

## 2016-11-17 NOTE — ED Notes (Signed)
Meal tray given to patient.  Patient updated on plan of care at this time and verbalized understanding.

## 2016-11-17 NOTE — ED Notes (Signed)
Patient speaking with SOC at this time.

## 2016-11-17 NOTE — Discharge Instructions (Signed)
Please follow up with [facility] as directed. Return to the ER for worsening symptoms, feelings of hurting yourself or others, or other concerns.

## 2016-11-17 NOTE — ED Provider Notes (Signed)
-----------------------------------------   6:30 AM on 11/17/2016 -----------------------------------------   Blood pressure 123/88, pulse 73, temperature 98.7 F (37.1 C), temperature source Oral, resp. rate (!) 21, height 5\' 11"  (1.803 m), weight 106.6 kg (235 lb), SpO2 93 %.  The patient had no acute events since last update.  Calm and cooperative at this time. [facility] psychiatrist Dr. Jonelle Sidle evaluated patient and deemed patient is psychiatrically stable for discharge home and to follow up with outpatient therapist. Strict return precautions given. Patient verbalizes understanding and agrees with plan of care.     Irean Hong, MD 11/17/16 [phone]

## 2016-11-17 NOTE — ED Notes (Signed)
E-signature pad unavailable; pt signed hard copy. Hard copy to be scanned in with medical records.

## 2016-11-30 ENCOUNTER — Emergency Department
Admission: EM | Admit: 2016-11-30 | Discharge: 2016-11-30 | Disposition: A | Discharge status: Other Acute Inpatient Hospital | Payer: Medicare Other | Attending: Emergency Medicine | Admitting: Emergency Medicine

## 2016-11-30 ENCOUNTER — Encounter: Payer: Self-pay | Admitting: Emergency Medicine

## 2016-11-30 DIAGNOSIS — R440 Auditory hallucinations: Secondary | ICD-10-CM | POA: Diagnosis present

## 2016-11-30 DIAGNOSIS — F319 Bipolar disorder, unspecified: Secondary | ICD-10-CM | POA: Diagnosis not present

## 2016-11-30 DIAGNOSIS — F17210 Nicotine dependence, cigarettes, uncomplicated: Secondary | ICD-10-CM | POA: Diagnosis not present

## 2016-11-30 DIAGNOSIS — F209 Schizophrenia, unspecified: Secondary | ICD-10-CM | POA: Diagnosis not present

## 2016-11-30 DIAGNOSIS — F259 Schizoaffective disorder, unspecified: Secondary | ICD-10-CM | POA: Diagnosis not present

## 2016-11-30 LAB — COMPREHENSIVE METABOLIC PANEL
ALT: 18 U/L (ref 17–63)
ANION GAP: 8 (ref 5–15)
AST: 18 U/L (ref 15–41)
Albumin: 4.1 g/dL (ref 3.5–5.0)
Alkaline Phosphatase: 68 U/L (ref 38–126)
BILIRUBIN TOTAL: 0.8 mg/dL (ref 0.3–1.2)
BUN: 13 mg/dL (ref 6–20)
CO2: 26 mmol/L (ref 22–32)
Calcium: 9.1 mg/dL (ref 8.9–10.3)
Chloride: 105 mmol/L (ref 101–111)
Creatinine, Ser: 0.92 mg/dL (ref 0.61–1.24)
GFR calc Af Amer: >60 mL/min (ref >60)
Glucose, Bld: 170 mg/dL — ABNORMAL HIGH (ref 65–99)
POTASSIUM: 3.8 mmol/L (ref 3.5–5.1)
Sodium: 139 mmol/L (ref 135–145)
TOTAL PROTEIN: 7.6 g/dL (ref 6.5–8.1)

## 2016-11-30 LAB — ETHANOL

## 2016-11-30 LAB — URINE DRUG SCREEN, QUALITATIVE (ARMC ONLY)
AMPHETAMINES, UR SCREEN: NOT DETECTED
BENZODIAZEPINE, UR SCRN: NOT DETECTED
Barbiturates, Ur Screen: NOT DETECTED
COCAINE METABOLITE, UR ~~LOC~~: NOT DETECTED
Cannabinoid 50 Ng, Ur ~~LOC~~: NOT DETECTED
MDMA (ECSTASY) UR SCREEN: NOT DETECTED
METHADONE SCREEN, URINE: NOT DETECTED
OPIATE, UR SCREEN: NOT DETECTED
Phencyclidine (PCP) Ur S: NOT DETECTED
Tricyclic, Ur Screen: NOT DETECTED

## 2016-11-30 LAB — CBC
HEMATOCRIT: 45.4 % (ref 40.0–52.0)
HEMOGLOBIN: 15.7 g/dL (ref 13.0–18.0)
MCH: 30.4 pg (ref 26.0–34.0)
MCHC: 34.5 g/dL (ref 32.0–36.0)
MCV: 88.2 fL (ref 80.0–100.0)
Platelets: 259 10*3/uL (ref 150–440)
RBC: 5.15 MIL/uL (ref 4.40–5.90)
RDW: 13.6 % (ref 11.5–14.5)
WBC: 8.2 10*3/uL (ref 3.8–10.6)

## 2016-11-30 LAB — ACETAMINOPHEN LEVEL

## 2016-11-30 LAB — SALICYLATE LEVEL: Salicylate Lvl: <7 mg/dL (ref 2.8–30.0)

## 2016-11-30 MED ORDER — LORAZEPAM 2 MG PO TABS: 2.0000 mg | ORAL_TABLET | Freq: Four times a day (QID) | ORAL | Status: DC | PRN

## 2016-11-30 MED ORDER — ZIPRASIDONE HCL 20 MG PO CAPS
40.0000 mg | ORAL_CAPSULE | Freq: Two times a day (BID) | ORAL | Status: DC
  Administered 2016-11-30: 40 mg via ORAL
  Filled 2016-11-30: qty 2

## 2016-11-30 MED ORDER — CLONAZEPAM 0.5 MG PO TABS
0.5000 mg | ORAL_TABLET | Freq: Two times a day (BID) | ORAL | Status: DC
  Administered 2016-11-30 (×2): 0.5 mg via ORAL
  Filled 2016-11-30 (×2): qty 1

## 2016-11-30 NOTE — BH Assessment (Addendum)
Spoke with Annice PihJackie, representative from [facility], to inform her the patient may not be able to arrive tonight due to transportation.  Counselor will give [facility] updates if things change.

## 2016-11-30 NOTE — ED Notes (Signed)
SOC completed 

## 2016-11-30 NOTE — ED Notes (Signed)
Patient resting quietly with eyes closed.  He did not respond when ask if he wanted his scheduled medication.  Urine speciman cup left in room.

## 2016-11-30 NOTE — ED Notes (Signed)
Patient received PM snack. 

## 2016-11-30 NOTE — ED Notes (Signed)

## 2016-11-30 NOTE — ED Triage Notes (Signed)
Pt to ed with c/o hearing voices and visual hallucinations.  Pt reports the voices tell him to hurt himself and others.  Pt states they laugh at him and make fun of him.  Pt calm at this time at triage.

## 2016-11-30 NOTE — ED Notes (Signed)
Called SOC for consult   1115 

## 2016-11-30 NOTE — ED Notes (Signed)
[facility]   [phone]

## 2016-11-30 NOTE — ED Provider Notes (Signed)
[facility] Emergency Department Provider Note  ____________________________________________   First MD Initiated Contact with Patient 11/30/16 1030     (approximate)  I have reviewed the triage vital signs and the nursing notes.   HISTORY  Chief Complaint Suicidal   Level 5 caveat:  history/ROS limited by active psychosis / mental illness / altered mental status   HPI Albert Miranda is a 36 y.o. male with an extensive psychiatric history who presents for evaluation of auditory and visual hallucinations.  He states that he always hears voices and frequently sees things, but they have gotten much worse recently.  He is vague about what he sees, but states that there aremany voices the talk to him and they have gotten much worse recently.  They make fun of him making feel bad about himself and tell him to kill himself. he is vague and will not answer the question when asked if he wants to kill himself.  He denies any recent illnesses.  He states he does not take his psychiatric medicine because he ran out.he states the symptoms are similar to the past but much worse.  Nothing in particular makes the patient's symptoms better nor worse.    He denies chest pain, shortness of breath, nausea, vomiting, abdominal pain.   Past Medical History:  Diagnosis Date  . Bipolar 1 disorder (HCC)   . Patient denies medical problems     Patient Active Problem List   Diagnosis Date Noted  . Elevated blood sugar 10/23/2016  . Schizoaffective disorder (HCC) 05/01/2016  . Cannabis abuse 05/01/2016  . Noncompliance 05/01/2016    Past Surgical History:  Procedure Laterality Date  . LEG SURGERY      Prior to Admission medications   Medication Sig Start Date End Date Taking? Authorizing Provider  clonazePAM (KLONOPIN) 0.5 MG tablet Take 0.5 mg by mouth 2 (two) times daily. 09/17/16  Yes [provider]    Allergies Patient has no known allergies.  Family  History  Problem Relation Age of Onset  . Diabetes Father     Social History Social History  Substance Use Topics  . Smoking status: Current Every Day Smoker    Packs/day: 1.00    Types: Cigarettes  . Smokeless tobacco: Never Used  . Alcohol use Yes     Comment: occassional    Review of Systems Constitutional: No fever/chills Eyes: No visual changes. ENT: No sore throat. Cardiovascular: Denies chest pain. Respiratory: Denies shortness of breath. Gastrointestinal: No abdominal pain.  No nausea, no vomiting.  No diarrhea.  No constipation. Genitourinary: Negative for dysuria. Musculoskeletal: Negative for neck pain.  Negative for back pain. Integumentary: Negative for rash. Neurological: Negative for headaches, focal weakness or numbness. Psych:  the patient is hearing voices and seeing visual hallucinations.  Command hallucinations are telling him to kill himself.  ____________________________________________   PHYSICAL EXAM:  VITAL SIGNS: ED Triage Vitals [11/30/16 0918]  Enc Vitals Group     BP      Pulse Rate 82     Resp 18     Temp 98.2 F (36.8 C)     Temp Source Oral     SpO2 97 %     Weight 106.6 kg (235 lb)     Height      Head Circumference      Peak Flow      Pain Score      Pain Loc      Pain Edu?  Excl. in GC?     Constitutional: Alert and oriented. Well appearing and in no acute distress. Eyes: Conjunctivae are normal.  Head: Atraumatic. Nose: No congestion/rhinnorhea. Mouth/Throat: Mucous membranes are moist. Neck: No stridor.  No meningeal signs.   Cardiovascular: Normal rate, regular rhythm. Good peripheral circulation. Grossly normal heart sounds. Respiratory: Normal respiratory effort.  No retractions. Lungs CTAB. Gastrointestinal: Soft and nontender. No distention.  Musculoskeletal: No lower extremity tenderness nor edema. No gross deformities of extremities. Neurologic:  Normal speech and language. No gross focal neurologic  deficits are appreciated.  Skin:  Skin is warm, dry and intact. No rash noted. Psychiatric: mood and affect are relatively normal but a bit blunted.  He endorses heart score and visual hallucinations.  Avoids the question about SI/HI but states he has command hallucinations to kill himself  ____________________________________________   LABS (all labs ordered are listed, but only abnormal results are displayed)  Labs Reviewed  COMPREHENSIVE METABOLIC PANEL - Abnormal; Notable for the following:       Result Value   Glucose, Bld 170 (*)    All other components within normal limits  ACETAMINOPHEN LEVEL - Abnormal; Notable for the following:    Acetaminophen (Tylenol), Serum <10 (*)    All other components within normal limits  ETHANOL  SALICYLATE LEVEL  CBC  URINE DRUG SCREEN, QUALITATIVE ([facility] ONLY)   ____________________________________________  EKG  None - EKG not ordered by ED physician ____________________________________________  RADIOLOGY   No results found.  ____________________________________________   PROCEDURES  Critical Care performed: No   Procedure(s) performed:   Procedures   ____________________________________________   INITIAL IMPRESSION / ASSESSMENT AND PLAN / ED COURSE  Pertinent labs & imaging results that were available during my care of the patient were reviewed by me and considered in my medical decision making (see chart for details).  acute on chronic psychosis with hallucinations.  No evidence of acute medical issue I will consult telepsych and await their recommendations.   Clinical Course as of Dec 01 1639  Sat Nov 30, 2016  1242 I just spoke by phone with Dr. Maricela BoSprague with psychiatry.  He is evaluated the patient multiple times in the past and states that typically they send him home, but today he has a different feeling and feels that he is truly psychotic and needs inpatient treatment.  He is sending a report with specific  medication recommendations.  [CF]  1424 I reviewed the written report and ordered medications as per the recommendation of Dr. Maricela BoSprague  [CF]  1424 also completed the IVC paperwork.  [CF]    Clinical Course User Index [CF] Loleta RoseForbach, Tequlia Gonsalves, MD    ____________________________________________  FINAL CLINICAL IMPRESSION(S) / ED DIAGNOSES  Final diagnoses:  Schizophrenia, unspecified type (HCC)     MEDICATIONS GIVEN DURING THIS VISIT:  Medications  clonazePAM (KLONOPIN) tablet 0.5 mg (0.5 mg Oral Given 11/30/16 1519)  ziprasidone (GEODON) capsule 40 mg (not administered)  LORazepam (ATIVAN) tablet 2 mg (not administered)     NEW OUTPATIENT MEDICATIONS STARTED DURING THIS VISIT:  New Prescriptions   No medications on file    Modified Medications   No medications on file    Discontinued Medications   ARIPIPRAZOLE (ABILIFY) 30 MG TABLET    Take 30 mg by mouth at bedtime.   CEPHALEXIN (KEFLEX) 500 MG CAPSULE    Take 1 capsule (500 mg total) by mouth 3 (three) times daily.   ETODOLAC (LODINE) 200 MG CAPSULE    Take 1 capsule (  200 mg total) by mouth every 8 (eight) hours.   HYDROCORTISONE 2.5 % OINTMENT    Apply topically 3 (three) times daily.   HYDROXYZINE (VISTARIL) 50 MG CAPSULE    Take 50 mg by mouth 2 (two) times daily.   LAMOTRIGINE (LAMICTAL) 100 MG TABLET    Take 200 mg by mouth daily.   METFORMIN (GLUCOPHAGE) 500 MG TABLET    Take 500 mg by mouth 2 (two) times daily.   OLANZAPINE (ZYPREXA) 10 MG TABLET    Take 10 mg by mouth at bedtime.     Note:  This document was prepared using Dragon voice recognition software and may include unintentional dictation errors.    Loleta Rose, MD 11/30/16 [phone]

## 2016-11-30 NOTE — ED Notes (Signed)
Report was received from Jodelle GreenLuAnn C., RN; Pt. Verbalizes  complaints of hearing voices that put him down; distress; verbalizes having S.I.; with no specific plan; denies having Hi; Continue to monitor with 15 min. Monitoring.

## 2016-11-30 NOTE — BH Assessment (Addendum)
Patient has been accepted to [facility].  Patient assigned to room [facility]  Accepting physician is Dr. Darlys Galeseedy.  Call report to [phone].  Representative was [employer].  ER Staff is aware of it Marchelle Folks([facility] Claris CheMargaret Patient's Nurse)     Patient can arrive as soon as he can.

## 2016-11-30 NOTE — ED Notes (Signed)
SOC at bedside. Meal provided to pt. Pt calm and cooperative.

## 2016-11-30 NOTE — ED Notes (Signed)
[name] at bedside. 

## 2016-11-30 NOTE — BH Assessment (Addendum)
Pt placement referral submitted to the following facilities:   [facility]  [facility]  [facility]

## 2016-11-30 NOTE — BH Assessment (Addendum)
Assessment Note  Albert Miranda is an 36 y.o. male well known to ED. Assessment information gathered from pt interview and review of pt chart. Pt is self-referred to ED with c/o AVH w/ command to kill self.  Pt denies SI. Pt reports thoughts of HI and harming others. Pt denies HI/thoughts of harm plan and intent. Pt denies access to firearms and weapons. Pt reports non-compliance w/ RX x "a few weeks" due to running out of medication.   Diagnosis: Schizoaffective d/o, bipolar type  Past Medical History:  Past Medical History:  Diagnosis Date  . Bipolar 1 disorder (HCC)   . Patient denies medical problems     Past Surgical History:  Procedure Laterality Date  . LEG SURGERY      Family History:  Family History  Problem Relation Age of Onset  . Diabetes Father     Social History:  reports that he has been smoking Cigarettes.  He has been smoking about 1.00 pack per day. He has never used smokeless tobacco. He reports that he drinks alcohol. He reports that he uses drugs, including Marijuana.  Additional Social History:  Alcohol / Drug Use Pain Medications: Pt denies abuse. Prescriptions: Pt denies abuse. Over the Counter: Pt denies abuse. History of alcohol / drug use?: No history of alcohol / drug abuse (Pt denies)  CIWA: CIWA-Ar Pulse Rate: 82 COWS:    Allergies: No Known Allergies  Home Medications:  (Not in a hospital admission)  OB/GYN Status:  No LMP for male patient.  General Assessment Data [name] Assessment: In system Is this a Tele or Face-to-Face Assessment?: Face-to-Face Is this an Initial Assessment or a Re-assessment for this encounter?: Initial Assessment Marital status:  (UTA) Is patient pregnant?: No Pregnancy Status: No Living Arrangements: Parent (father) Can pt return to current living arrangement?: Yes Admission Status: Voluntary (Pt pending IVC) Is patient capable of signing voluntary admission?: No Insurance  type: Medicare     Crisis Care Plan Living Arrangements: Parent (father) Name of Psychiatrist: None Name of Therapist: None  Education Status Is patient currently in school?: No Highest grade of school patient has completed: Some College  Risk to self with the past 6 months Suicidal Ideation: No Has patient been a risk to self within the past 6 months prior to admission? : No Suicidal Intent: No Has patient had any suicidal intent within the past 6 months prior to admission? : No Is patient at risk for suicide?: No Suicidal Plan?: No Has patient had any suicidal plan within the past 6 months prior to admission? : No Access to Means: No What has been your use of drugs/alcohol within the last 12 months?: Pt denies alcohol/drug use. Pt chart notes h/o  Previous Attempts/Gestures: No (Pt denies) Other Self Harm Risks: noncompliant w/ meds Triggers for Past Attempts: None known Intentional Self Injurious Behavior: None Family Suicide History: No Persecutory voices/beliefs?: No Depression:  (UTA) Substance abuse history and/or treatment for substance abuse?: No (pt denies) Suicide prevention information given to non-admitted patients: Not applicable  Risk to Others within the past 6 months Homicidal Ideation: Yes-Currently Present Does patient have any lifetime risk of violence toward others beyond the six months prior to admission? : Yes (comment) (h/o aggression and hypersexuality per pt's chart) Thoughts of Harm to Others: Yes-Currently Present Comment - Thoughts of Harm to Others: Pt reports non-specific thoughts of HI and harming others Current Homicidal Intent: No Current Homicidal Plan: No Access to Homicidal  Means: No History of harm to others?: Yes (h/o aggression and hypersexuality per pt's chart) Violent Behavior Description: None observed Does patient have access to weapons?: No Criminal Charges Pending?: No Does patient have a court date: No Is patient on  probation?: No  Psychosis Hallucinations: Auditory, Visual, With command (w/command to kill self) Delusions: None noted  Mental Status Report Appearance/Hygiene: In scrubs Eye Contact: Poor Motor Activity: Unremarkable Speech: Soft, Slow Level of Consciousness: Drowsy (lethargic) Mood: Euthymic Affect: Constricted Anxiety Level: None Thought Processes: Coherent, Relevant Judgement: Partial Orientation: Person, Place, Situation Obsessive Compulsive Thoughts/Behaviors: None  Cognitive Functioning Concentration: Decreased Memory: Recent Intact, Remote Intact IQ: Average Insight: Fair Impulse Control: Fair Appetite:  (UTA) Weight Loss:  (UTA) Weight Gain:  (UTA) Sleep: Unable to Assess Total Hours of Sleep:  (UTA) Vegetative Symptoms: Unable to Assess  ADLScreening [facility] Assessment Services) Patient's cognitive ability adequate to safely complete daily activities?: Yes Patient able to express need for assistance with ADLs?: Yes Independently performs ADLs?: Yes (appropriate for developmental age)  Prior Inpatient Therapy Prior Inpatient Therapy: Yes (per chart) Prior Therapy Dates: Not Reported Prior Therapy Facilty/Provider(s): UTA Reason for Treatment: UTA  Prior Outpatient Therapy Prior Outpatient Therapy: No Does patient have an ACCT team?: No Does patient have Intensive In-House Services?  : No Does patient have [facility] services? : No Does patient have P4CC services?: No  ADL Screening (condition at time of admission) Patient's cognitive ability adequate to safely complete daily activities?: Yes Is the patient deaf or have difficulty hearing?: No Does the patient have difficulty seeing, even when wearing glasses/contacts?: No Does the patient have difficulty concentrating, remembering, or making decisions?: Yes Patient able to express need for assistance with ADLs?: Yes Does the patient have difficulty dressing or bathing?: No Independently performs ADLs?:  Yes (appropriate for developmental age) Does the patient have difficulty walking or climbing stairs?: No Weakness of Legs: None Weakness of Arms/Hands: None  Home Assistive Devices/Equipment Home Assistive Devices/Equipment: None  Therapy Consults (therapy consults require a physician order) PT Evaluation Needed: No OT Evalulation Needed: No SLP Evaluation Needed: No Abuse/Neglect Assessment (Assessment to be complete while patient is alone) Physical Abuse:  (UTA) Verbal Abuse:  (UTA) Sexual Abuse:  (UTA) Exploitation of patient/patient's resources:  (UTA) Self-Neglect:  (UTA) Values / Beliefs Cultural Requests During Hospitalization: None Spiritual Requests During Hospitalization: None Consults Spiritual Care Consult Needed: No Social Work Consult Needed: No Merchant navy officer (For Healthcare) Does Patient Have a Medical Advance Directive?: No Would patient like information on creating a medical advance directive?: No - Patient declined    Additional Information 1:1 In Past 12 Months?: No CIRT Risk: No Elopement Risk: No Does patient have medical clearance?: Yes     Disposition:  Disposition Initial Assessment Completed for this Encounter: Yes Disposition of Patient: Inpatient treatment program Type of inpatient treatment program: Adult Theotis Barrio Sprague, DO [facility]) recommends inpatient admission)  On Site Evaluation by:   Reviewed with Physician:    Samreen Seltzer J [place] 11/30/2016 3:39 PM

## 2016-11-30 NOTE — ED Provider Notes (Signed)
Clinical Course as of Nov 30 1241  Sat Nov 30, 2016  1242 I just spoke by phone with Dr. Maricela BoSprague with psychiatry.  He is evaluated the patient multiple times in the past and states that typically they send him home, but today he has a different feeling and feels that he is truly psychotic and needs inpatient treatment.  He is sending a report with specific medication recommendations.  [CF]    Clinical Course User Index [CF] Loleta RoseForbach, Ainhoa Rallo, MD      Loleta RoseForbach, Jovani Flury, MD 11/30/16 [phone]

## 2016-12-01 DIAGNOSIS — Z62810 Personal history of physical and sexual abuse in childhood: Secondary | ICD-10-CM | POA: Diagnosis present

## 2016-12-01 DIAGNOSIS — R45851 Suicidal ideations: Secondary | ICD-10-CM | POA: Diagnosis present

## 2016-12-01 DIAGNOSIS — Z7984 Long term (current) use of oral hypoglycemic drugs: Secondary | ICD-10-CM | POA: Diagnosis not present

## 2016-12-01 DIAGNOSIS — E119 Type 2 diabetes mellitus without complications: Secondary | ICD-10-CM | POA: Diagnosis present

## 2016-12-01 DIAGNOSIS — Z9114 Patient's other noncompliance with medication regimen: Secondary | ICD-10-CM | POA: Diagnosis not present

## 2016-12-01 DIAGNOSIS — F250 Schizoaffective disorder, bipolar type: Secondary | ICD-10-CM | POA: Diagnosis present

## 2016-12-01 DIAGNOSIS — F17210 Nicotine dependence, cigarettes, uncomplicated: Secondary | ICD-10-CM | POA: Diagnosis present

## 2017-02-04 DIAGNOSIS — F3175 Bipolar disorder, in partial remission, most recent episode depressed: Secondary | ICD-10-CM | POA: Diagnosis not present

## 2017-02-07 ENCOUNTER — Other Ambulatory Visit (HOSPITAL_COMMUNITY)
Admission: RE | Admit: 2017-02-07 | Discharge: 2017-02-07 | Disposition: A | Discharge status: Home / Self Care | Payer: Medicare Other | Source: Ambulatory Visit | Attending: Internal Medicine | Admitting: Internal Medicine

## 2017-02-07 ENCOUNTER — Encounter: Payer: Self-pay | Admitting: Internal Medicine

## 2017-02-07 ENCOUNTER — Ambulatory Visit (INDEPENDENT_AMBULATORY_CARE_PROVIDER_SITE_OTHER): Payer: Medicare Other | Admitting: Internal Medicine

## 2017-02-07 VITALS — BP 108/79 | HR 76 | Temp 98.4°F | Resp 16 | Ht 71.0 in | Wt 261.5 lb

## 2017-02-07 DIAGNOSIS — Z113 Encounter for screening for infections with a predominantly sexual mode of transmission: Secondary | ICD-10-CM | POA: Diagnosis not present

## 2017-02-07 DIAGNOSIS — Z114 Encounter for screening for human immunodeficiency virus [HIV]: Secondary | ICD-10-CM | POA: Diagnosis not present

## 2017-02-07 DIAGNOSIS — Z1329 Encounter for screening for other suspected endocrine disorder: Secondary | ICD-10-CM

## 2017-02-07 DIAGNOSIS — E1165 Type 2 diabetes mellitus with hyperglycemia: Secondary | ICD-10-CM

## 2017-02-07 DIAGNOSIS — Z23 Encounter for immunization: Secondary | ICD-10-CM

## 2017-02-07 DIAGNOSIS — Z13220 Encounter for screening for lipoid disorders: Secondary | ICD-10-CM

## 2017-02-07 DIAGNOSIS — N489 Disorder of penis, unspecified: Secondary | ICD-10-CM | POA: Diagnosis not present

## 2017-02-07 DIAGNOSIS — R739 Hyperglycemia, unspecified: Secondary | ICD-10-CM

## 2017-02-07 LAB — LIPID PANEL
CHOLESTEROL: 160 mg/dL (ref 0–200)
HDL: 33.8 mg/dL — AB (ref >39.00)
LDL Cholesterol: 93 mg/dL (ref 0–99)
NonHDL: 126.28
Total CHOL/HDL Ratio: 5
Triglycerides: 168 mg/dL — ABNORMAL HIGH (ref 0.0–149.0)
VLDL: 33.6 mg/dL (ref 0.0–40.0)

## 2017-02-07 LAB — HEMOGLOBIN A1C: Hgb A1c MFr Bld: 10.1 % — ABNORMAL HIGH (ref 4.6–6.5)

## 2017-02-07 LAB — TSH: TSH: 1.33 u[IU]/mL (ref 0.35–4.50)

## 2017-02-07 LAB — T4, FREE: FREE T4: 1.01 ng/dL (ref 0.60–1.60)
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
## 2017-02-07 NOTE — Patient Instructions (Signed)
1. We will refer you to dermatology  2. Please have blood work today and follow up in 1-2 months  3. You were given the flu and Tdap vaccines today   Hyperglycemia Hyperglycemia is when the sugar (glucose) level in your blood is too high. It may not cause symptoms. If you do have symptoms, they may include warning signs, such as:  Feeling more thirsty than normal.  Hunger.  Feeling tired.  Needing to pee (urinate) more than normal.  Blurry eyesight (vision).  You may get other symptoms as it gets worse, such as:  Dry mouth.  Not being hungry (loss of appetite).  Fruity-smelling breath.  Weakness.  Weight gain or loss that is not planned. Weight loss may be fast.  A tingling or numb feeling in your hands or feet.  Headache.  Skin that does not bounce back quickly when it is lightly pinched and released (poor skin turgor).  Pain in your belly (abdomen).  Cuts or bruises that heal slowly.  High blood sugar can happen to people who do or do not have diabetes. High blood sugar can happen slowly or quickly, and it can be an emergency. Follow these instructions at home: General instructions  Take over-the-counter and prescription medicines only as told by your doctor.  Do not use products that contain nicotine or tobacco, such as cigarettes and e-cigarettes. If you need help quitting, ask your doctor.  Limit alcohol intake to no more than 1 drink per day for nonpregnant women and 2 drinks per day for men. One drink equals 12 oz of beer, 5 oz of wine, or 1 oz of hard liquor.  Manage stress. If you need help with this, ask your doctor.  Keep all follow-up visits as told by your doctor. This is important. Eating and drinking  Stay at a healthy weight.  Exercise regularly, as told by your doctor.  Drink enough fluid, especially when you: ? Exercise. ? Get sick. ? Are in hot temperatures.  Eat healthy foods, such as: ? Low-fat (lean) proteins. ? Complex carbs  (complex carbohydrates), such as whole wheat bread or brown rice. ? Fresh fruits and vegetables. ? Low-fat dairy products. ? Healthy fats.  Drink enough fluid to keep your pee (urine) clear or pale yellow. If you have diabetes:  Make sure you know the symptoms of hyperglycemia.  Follow your diabetes management plan, as told by your doctor. Make sure you: ? Take insulin and medicines as told. ? Follow your exercise plan. ? Follow your meal plan. Eat on time. Do not skip meals. ? Check your blood sugar as often as told. Make sure to check before and after exercise. If you exercise longer or in a different way than you normally do, check your blood sugar more often. ? Follow your sick day plan whenever you cannot eat or drink normally. Make this plan ahead of time with your doctor.  Share your diabetes management plan with people in your workplace, school, and household.  Check your urine for ketones when you are ill and as told by your doctor.  Carry a card or wear jewelry that says that you have diabetes. Contact a doctor if:  Your blood sugar level is higher than 240 mg/dL (16.113.3 mmol/L) for 2 days in a row.  You have problems keeping your blood sugar in your target range.  High blood sugar happens often for you. Get help right away if:  You have trouble breathing.  You have a change in  how you think, feel, or act (mental status).  You feel sick to your stomach (nauseous), and that feeling does not go away.  You cannot stop throwing up (vomiting). These symptoms may be an emergency. Do not wait to see if the symptoms will go away. Get medical help right away. Call your local emergency services (911 in the [place] Do not drive yourself to the hospital. Summary  Hyperglycemia is when the sugar (glucose) level in your blood is too high.  High blood sugar can happen to people who do or do not have diabetes.  Make sure you drink enough fluids, eat healthy foods, and exercise  regularly.  Contact your doctor if you have problems keeping your blood sugar in your target range. This information is not intended to replace advice given to you by your health care provider. Make sure you discuss any questions you have with your health care provider. Document Released: 01/06/2009 Document Revised: 11/27/2015 Document Reviewed: 11/27/2015 Elsevier Interactive Patient Education  2017 [employer]

## 2017-02-07 NOTE — Addendum Note (Signed)
Addended by: Warden FillersWRIGHT, LATOYA S on: 02/07/2017 11:09 AM   Modules accepted: Orders

## 2017-02-09 DIAGNOSIS — E1165 Type 2 diabetes mellitus with hyperglycemia: Secondary | ICD-10-CM | POA: Insufficient documentation

## 2017-02-09 DIAGNOSIS — N489 Disorder of penis, unspecified: Secondary | ICD-10-CM | POA: Insufficient documentation

## 2017-02-10 ENCOUNTER — Telehealth: Payer: Self-pay

## 2017-02-10 LAB — HIV ANTIBODY (ROUTINE TESTING W REFLEX): HIV: NONREACTIVE

## 2017-02-10 LAB — HEPATITIS B CORE ANTIBODY, TOTAL: HEP B C TOTAL AB: NONREACTIVE

## 2017-02-10 LAB — RPR: RPR: NONREACTIVE

## 2017-02-10 LAB — URINE CYTOLOGY ANCILLARY ONLY
CHLAMYDIA, DNA PROBE: NEGATIVE
NEISSERIA GONORRHEA: NEGATIVE
Trichomonas: NEGATIVE

## 2017-02-10 LAB — HEPATITIS B SURFACE ANTIBODY, QUANTITATIVE

## 2017-02-10 NOTE — Telephone Encounter (Signed)
Left message for patient to call , appointment available for tomorrow 02/11/17 11:00 am to discuss labs.

## 2017-02-11 ENCOUNTER — Ambulatory Visit: Payer: Self-pay | Admitting: Internal Medicine

## 2017-02-11 LAB — HSV(HERPES SIMPLEX VRS) I + II AB-IGM: HSVI/II Comb IgM: 1.14 Ratio — ABNORMAL HIGH (ref 0.00–0.90)

## 2017-02-12 ENCOUNTER — Ambulatory Visit: Payer: Self-pay | Admitting: Internal Medicine

## 2017-02-12 NOTE — Telephone Encounter (Signed)
Spoke with patient states he never received voicemail that I left him yesterday and documented on schedule .  No available appointments next week.  Please advise.

## 2017-02-17 LAB — TEST AUTHORIZATION

## 2017-02-17 LAB — HEPATITIS C ANTIBODY
HEP C AB: NONREACTIVE
SIGNAL TO CUT-OFF: 0.02 (ref <1.00)

## 2017-02-17 LAB — HSV(HERPES SIMPLEX VRS) I + II AB-IGG
HSV 1 IGG, TYPE SPEC: 49.6 {index} — AB
HSV 2 IGG,TYPE SPECIFIC AB: 1.86 index — ABNORMAL HIGH

## 2017-02-17 LAB — HSV 1/2 AB (IGM), IFA W/RFLX TITER
HSV 1 IGM SCREEN: NEGATIVE
HSV 2 IGM SCREEN: NEGATIVE

## 2017-02-17 LAB — HEPATITIS B SURFACE ANTIGEN: HEP B S AG: NONREACTIVE

## 2017-02-18 ENCOUNTER — Encounter: Payer: Self-pay | Admitting: Internal Medicine

## 2017-02-20 ENCOUNTER — Ambulatory Visit (INDEPENDENT_AMBULATORY_CARE_PROVIDER_SITE_OTHER): Payer: Medicare Other | Admitting: Internal Medicine

## 2017-02-20 ENCOUNTER — Encounter: Payer: Self-pay | Admitting: Internal Medicine

## 2017-02-20 VITALS — BP 112/74 | HR 92 | Temp 98.4°F | Ht 71.0 in | Wt 259.0 lb

## 2017-02-20 DIAGNOSIS — E1165 Type 2 diabetes mellitus with hyperglycemia: Secondary | ICD-10-CM | POA: Diagnosis not present

## 2017-02-20 DIAGNOSIS — Z23 Encounter for immunization: Secondary | ICD-10-CM

## 2017-02-20 DIAGNOSIS — B009 Herpesviral infection, unspecified: Secondary | ICD-10-CM

## 2017-02-20 DIAGNOSIS — F250 Schizoaffective disorder, bipolar type: Secondary | ICD-10-CM

## 2017-02-20 MED ORDER — SITAGLIPTIN PHOSPHATE 100 MG PO TABS: 100.0000 mg | ORAL_TABLET | Freq: Every day | ORAL | 1 refills | Status: DC

## 2017-02-20 MED ORDER — METFORMIN HCL 500 MG PO TABS: 500.0000 mg | ORAL_TABLET | Freq: Two times a day (BID) | ORAL | 3 refills | Status: DC

## 2017-02-20 MED ORDER — VALACYCLOVIR HCL 500 MG PO TABS: 500.0000 mg | ORAL_TABLET | Freq: Every day | ORAL | 3 refills | Status: DC
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
## 2017-02-20 NOTE — Addendum Note (Signed)
Addended by: Alisia FerrariBARE, Zaiya Annunziato C on: 02/20/2017 11:52 AM   Modules accepted: Orders

## 2017-02-20 NOTE — Patient Instructions (Addendum)
Please follow up in 1 month with me and to get your hepatitis B vaccine  Think about treatment for diabetes after speaking to the lab We will need to put you on diabetes pills as well as cholesterol pills   Start Valtrex for herpes if you see a blister take 1 pill 2x per day for 3 days then go back to your 1 pill per day dose   Happy Holidays   Type 2 Diabetes Mellitus, Diagnosis, Adult Type 2 diabetes (type 2 diabetes mellitus) is a long-term (chronic) disease. In type 2 diabetes, one or both of these problems may be present:  The pancreas does not make enough of a hormone called insulin.  Cells in the body do not respond properly to insulin that the body makes (insulin resistance).  Normally, insulin allows blood sugar (glucose) to enter cells in the body. The cells use glucose for energy. Insulin resistance or lack of insulin causes excess glucose to build up in the blood instead of going into cells. As a result, high blood glucose (hyperglycemia) develops. What increases the risk? The following factors may make you more likely to develop type 2 diabetes:  Having a family member with type 2 diabetes.  Being overweight or obese.  Having an inactive (sedentary) lifestyle.  Having been diagnosed with insulin resistance.  Having a history of prediabetes, gestational diabetes, or polycystic ovarian syndrome (PCOS).  Being of American-Indian, African-American, Hispanic/Latino, or Asian/Pacific Islander descent.  What are the signs or symptoms? In the early stage of this condition, you may not have symptoms. Symptoms develop slowly and may include:  Increased thirst (polydipsia).  Increased hunger(polyphagia).  Increased urination (polyuria).  Increased urination during the night (nocturia).  Unexplained weight loss.  Frequent infections that keep coming back (recurring).  Fatigue.  Weakness.  Vision changes, such as blurry vision.  Cuts or bruises that are slow to  heal.  Tingling or numbness in the hands or feet.  Dark patches on the skin (acanthosis nigricans).  How is this diagnosed?  This condition is diagnosed based on your symptoms, your medical history, a physical exam, and your blood glucose level. Your blood glucose may be checked with one or more of the following blood tests:  A fasting blood glucose (FBG) test. You will not be allowed to eat (you will fast) for at least 8 hours before a blood sample is taken.  A random blood glucose test. This checks blood glucose at any time of day regardless of when you ate.  An A1c (hemoglobin A1c) blood test. This provides information about blood glucose control over the previous 2-3 months.  An oral glucose tolerance test (OGTT). This measures your blood glucose at two times: ? After fasting. This is your baseline blood glucose level. ? Two hours after drinking a beverage that contains glucose.  You may be diagnosed with type 2 diabetes if:  Your FBG level is 126 mg/dL (7.0 mmol/L) or higher.  Your random blood glucose level is 200 mg/dL (40.911.1 mmol/L) or higher.  Your A1c level is 6.5% or higher.  Your OGGT result is higher than 200 mg/dL (81.111.1 mmol/L).  These blood tests may be repeated to confirm your diagnosis. How is this treated?  Your treatment may be managed by a specialist called an endocrinologist. Type 2 diabetes may be treated by following instructions from your health care provider about:  Making diet and lifestyle changes. This may include: ? Following an individualized nutrition plan that is developed by a  diet and nutrition specialist (registered dietitian). ? Exercising regularly. ? Finding ways to manage stress.  Checking your blood glucose level as often as directed.  Taking diabetes medicines or insulin daily. This helps to keep your blood glucose levels in the healthy range. ? If you use insulin, you may need to adjust the dosage depending on how physically active  you are and what foods you eat. Your health care provider will tell you how to adjust your dosage.  Taking medicines to help prevent complications from diabetes, such as: ? Aspirin. ? Medicine to lower cholesterol. ? Medicine to control blood pressure.  Your health care provider will set individualized treatment goals for you. Your goals will be based on your age, other medical conditions you have, and how you respond to diabetes treatment. Generally, the goal of treatment is to maintain the following blood glucose levels:  Before meals (preprandial): 80-130 mg/dL (4.0-9.8 mmol/L).  After meals (postprandial): below 180 mg/dL (10 mmol/L).  A1c level: less than 7%.  Follow these instructions at home: Questions to Ask Your Health Care Provider Consider asking the following questions:  Do I need to meet with a diabetes educator?  Where can I find a support group for people with diabetes?  What equipment will I need to manage my diabetes at home?  What diabetes medicines do I need, and when should I take them?  How often do I need to check my blood glucose?  What number can I call if I have questions?  When is my next appointment?  General instructions  Take over-the-counter and prescription medicines only as told by your health care provider.  Keep all follow-up visits as told by your health care provider. This is important.  For more information about diabetes, visit: ? American Diabetes Association (ADA): www.diabetes.org ? American Association of Diabetes Educators (AADE): www.diabeteseducator.org/patient-resources Contact a health care provider if:  Your blood glucose is at or above 240 mg/dL (11.9 mmol/L) for 2 days in a row.  You have been sick or have had a fever for 2 days or longer and you are not getting better.  You have any of the following problems for more than 6 hours: ? You cannot eat or drink. ? You have nausea and vomiting. ? You have diarrhea. Get  help right away if:  Your blood glucose is lower than 54 mg/dL (3.0 mmol/L).  You become confused or you have trouble thinking clearly.  You have difficulty breathing.  You have moderate or large ketone levels in your urine. This information is not intended to replace advice given to you by your health care provider. Make sure you discuss any questions you have with your health care provider. Document Released: 03/11/2005 Document Revised: 08/17/2015 Document Reviewed: 04/14/2015 Elsevier Interactive Patient Education  2017 [employer]   Cold Sore A cold sore, also called a fever blister, is a skin infection that is caused by a virus. This infection causes small, fluid-filled sores to form inside of the mouth or on the lips, gums, nose, chin, or cheeks. Cold sores can spread to other parts of the body, such as the eyes or fingers. Cold sores can be spread or passed from person to person (contagious) until the sores crust over completely. Cold sores can be spread through close contact, such as kissing or sharing a drinking glass. Follow these instructions at home: Medicines  Take or apply over-the-counter and prescription medicines only as told by your doctor.  Use a cotton-tip swab to  apply creams or gels to your sores. Sore Care  Do not touch the sores or pick the scabs.  Wash your hands often. Do not touch your eyes without washing your hands first.  Keep the sores clean and dry.  If directed, apply ice to the sores:  Put ice in a plastic bag.  Place a towel between your skin and the bag.  Leave the ice on for 20 minutes, 2-3 times per day. Lifestyle  Do not kiss, have oral sex, or share personal items until your sores heal.  Eat a soft, bland diet. Avoid eating hot, cold, or salty foods. These can hurt your mouth.  Use a straw if it hurts to drink out of a glass.  Avoid the sun and limit your stress if these things trigger outbreaks. If sun causes cold sores, apply  sunscreen on your lips before being out in the sun. Contact a doctor if:  You have symptoms for more than two weeks.  You have pus coming from the sores.  You have redness that is spreading.  You have pain or irritation in your eye.  You get sores on your genitals.  Your sores do not heal within two weeks.  You get cold sores often. Get help right away if:  You have a fever and your symptoms suddenly get worse.  You have a headache and confusion. This information is not intended to replace advice given to you by your health care provider. Make sure you discuss any questions you have with your health care provider. Document Released: 09/10/2011 Document Revised: 08/17/2015 Document Reviewed: 12/30/2014 Elsevier Interactive Patient Education  2018 [employer].  Genital Herpes Genital herpes is a common sexually transmitted infection (STI) that is caused by a virus. The virus spreads from person to person through sexual contact. Infection can cause itching, blisters, and sores around the genitals or rectum. Symptoms may last several days and then go away This is called an outbreak. However, the virus remains in your body, so you may have more outbreaks in the future. The time between outbreaks varies and can be months or years. Genital herpes affects men and women. It is particularly concerning for pregnant women because the virus can be passed to the baby during delivery and can cause serious problems. Genital herpes is also a concern for people who have a weak disease-fighting (immune) system. What are the causes? This condition is caused by the herpes simplex virus (HSV) type 1 or type 2. The virus may spread through:  Sexual contact with an infected person, including vaginal, anal, and oral sex.  Contact with fluid from a herpes sore.  The skin. This means that you can get herpes from an infected partner even if he or she does not have a visible sore or does not know that he or  she is infected.  What increases the risk? You are more likely to develop this condition if:  You have sex with many partners.  You do not use latex condoms during sex.  What are the signs or symptoms? Most people do not have symptoms (asymptomatic) or have mild symptoms that may be mistaken for other skin problems. Symptoms may include:  Small red bumps near the genitals, rectum, or mouth. These bumps turn into blisters and then turn into sores.  Flu-like symptoms, including: ? Fever. ? Body aches. ? Swollen lymph nodes. ? Headache.  Painful urination.  Pain and itching in the genital area or rectal area.  Vaginal discharge.  Tingling or shooting pain in the legs and buttocks.  Generally, symptoms are more severe and last longer during the first (primary) outbreak. Flu-like symptoms are also more common during the primary outbreak. How is this diagnosed? Genital herpes may be diagnosed based on:  A physical exam.  Your medical history.  Blood tests.  A test of a fluid sample (culture) from an open sore.  How is this treated? There is no cure for this condition, but treatment with antiviral medicines that are taken by mouth (orally) can do the following:  Speed up healing and relieve symptoms.  Help to reduce the spread of the virus to sexual partners.  Limit the chance of future outbreaks, or make future outbreaks shorter.  Lessen symptoms of future outbreaks.  Your health care provider may also recommend pain relief medicines, such as aspirin or ibuprofen. Follow these instructions at home: Sexual activity  Do not have sexual contact during active outbreaks.  Practice safe sex. Latex condoms and male condoms may help prevent the spread of the herpes virus. General instructions  Keep the affected areas dry and clean.  Take over-the-counter and prescription medicines only as told by your health care provider.  Avoid rubbing or touching blisters and  sores. If you do touch blisters or sores: ? Wash your hands thoroughly with soap and water. ? Do not touch your eyes afterward.  To help relieve pain or itching, you may take the following actions as directed by your health care provider: ? Apply a cold, wet cloth (cold compress) to affected areas 4-6 times a day. ? Apply a substance that protects your skin and reduces bleeding (astringent). ? Apply a gel that helps relieve pain around sores (lidocaine gel). ? Take a warm, shallow bath that cleans the genital area (sitz bath).  Keep all follow-up visits as told by your health care provider. This is important. How is this prevented?  Use condoms. Although anyone can get genital herpes during sexual contact, even with the use of a condom, a condom can provide some protection.  Avoid having multiple sexual partners.  Talk with your sexual partner about any symptoms either of you may have. Also, talk with your partner about any history of STIs.  Get tested for STIs before you have sex. Ask your partner to do the same.  Do not have sexual contact if you have symptoms of genital herpes. Contact a health care provider if:  Your symptoms are not improving with medicine.  Your symptoms return.  You have new symptoms.  You have a fever.  You have abdominal pain.  You have redness, swelling, or pain in your eye.  You notice new sores on other parts of your body.  You are a woman and experience bleeding between menstrual periods.  You have had herpes and you become pregnant or plan to become pregnant. Summary  Genital herpes is a common sexually transmitted infection (STI) that is caused by the herpes simplex virus (HSV) type 1 or type 2.  These viruses are most often spread through sexual contact with an infected person.  You are more likely to develop this condition if you have sex with many partners or you have unprotected sex.  Most people do not have symptoms (asymptomatic)  or have mild symptoms that may be mistaken for other skin problems. Symptoms occur as outbreaks that may happen months or years apart.  There is no cure for this condition, but treatment with oral antiviral medicines can reduce symptoms,  reduce the chance of spreading the virus to a partner, prevent future outbreaks, or shorten future outbreaks. This information is not intended to replace advice given to you by your health care provider. Make sure you discuss any questions you have with your health care provider. Document Released: 03/08/2000 Document Revised: 02/09/2016 Document Reviewed: 02/09/2016 Elsevier Interactive Patient Education  2017 [employer]  .

## 2017-02-20 NOTE — Addendum Note (Signed)
Addended by: Quentin OreMCLEAN-SCOCUZZA, Jewelianna Pancoast on: 02/20/2017 11:58 AM   Modules accepted: Orders

## 2017-03-05 ENCOUNTER — Ambulatory Visit: Payer: Self-pay | Admitting: *Deleted

## 2017-03-06 ENCOUNTER — Ambulatory Visit: Payer: Self-pay | Admitting: Internal Medicine

## 2017-07-14 ENCOUNTER — Emergency Department
Admission: EM | Admit: 2017-07-14 | Discharge: 2017-07-14 | Discharge status: Against Medical Advice | Payer: Medicare Other

## 2018-02-06 IMAGING — DX DG FOOT COMPLETE 3+V*L*
3 series · 3 of 3 positions shown · non-contrast
Comparison: None.

CLINICAL DATA: Blister and foot pain for 2 days.

EXAM:
LEFT FOOT - COMPLETE 3+ VIEW

[foot ap]
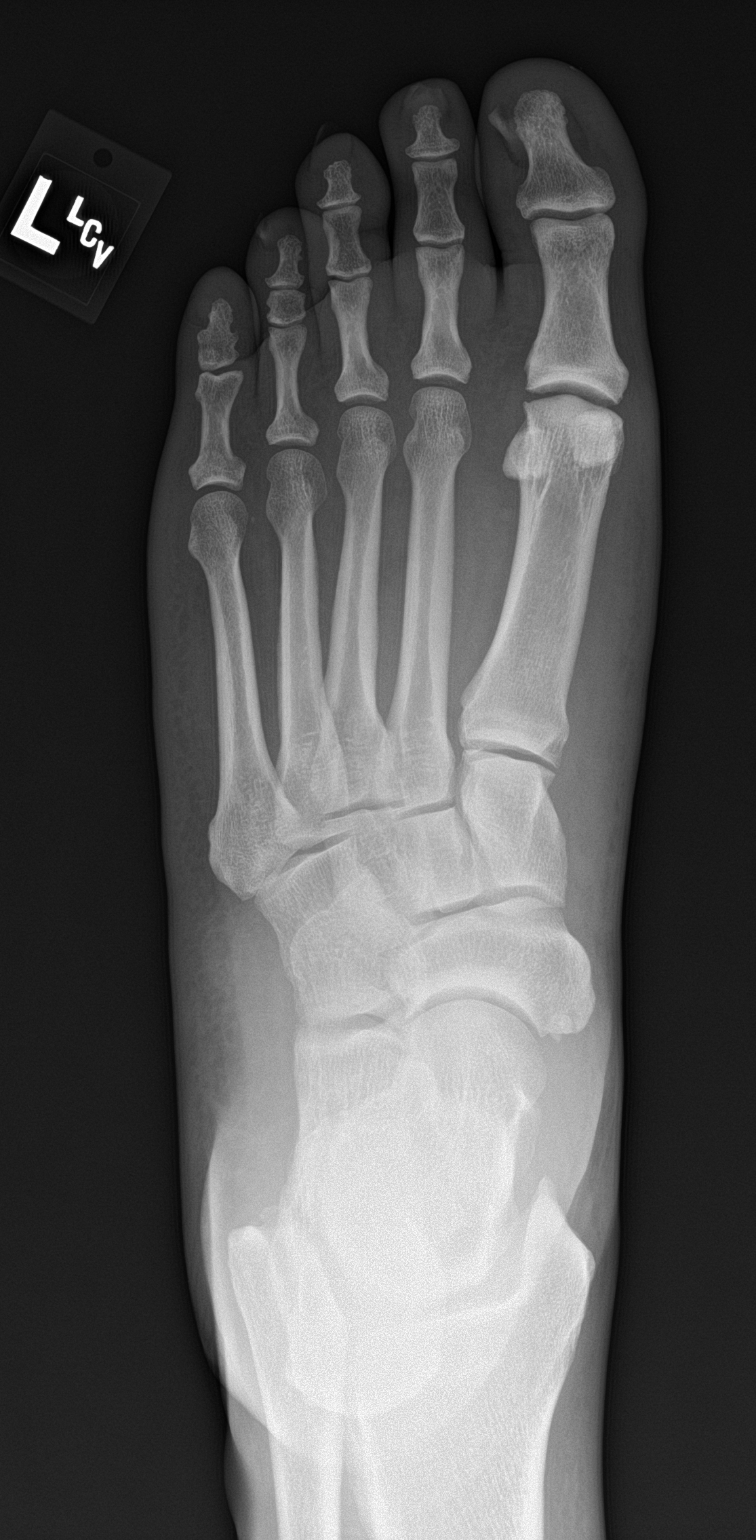

[foot obl]
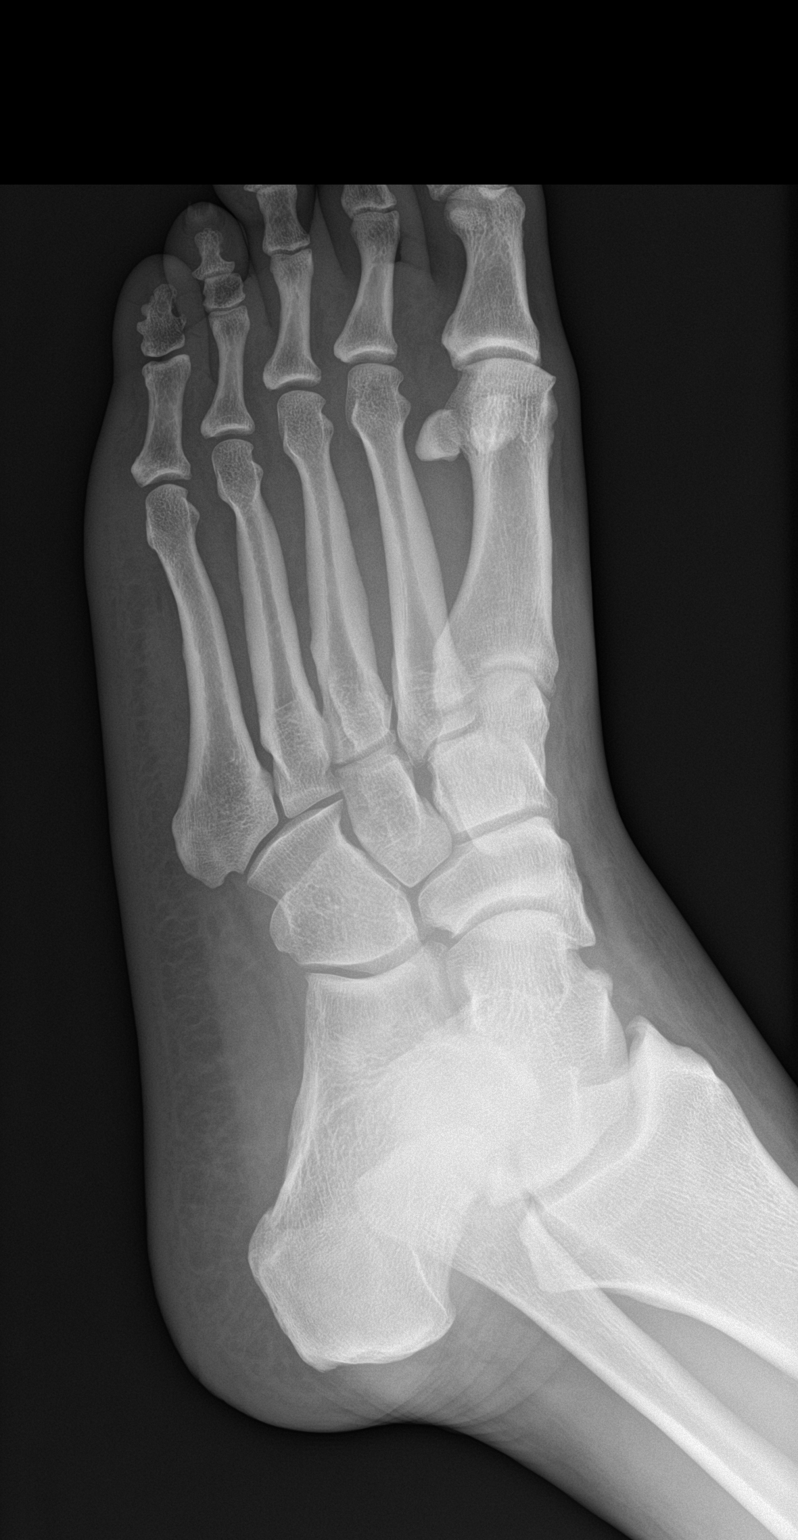

[foot lat]
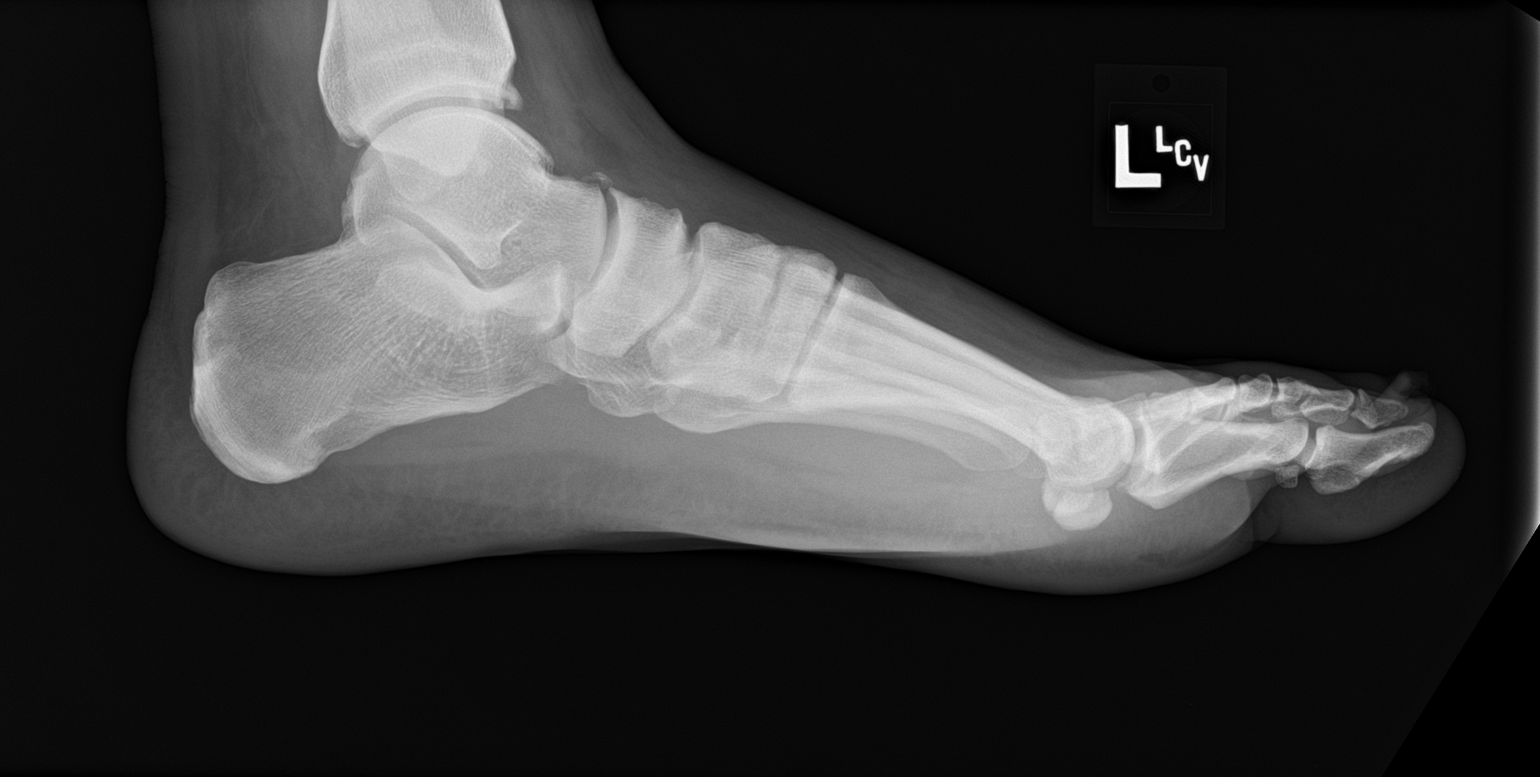

[3 of 3 positions shown; findings below may reference images not displayed]

FINDINGS: There is no evidence of fracture or dislocation. No opaque foreign
body or soft tissue emphysema. Mild spurring at the first MTP joint.
IMPRESSION: No acute osseous finding or opaque foreign body.

## 2018-04-14 DIAGNOSIS — F29 Unspecified psychosis not due to a substance or known physiological condition: Secondary | ICD-10-CM | POA: Diagnosis not present

## 2018-04-14 DIAGNOSIS — F1211 Cannabis abuse, in remission: Secondary | ICD-10-CM | POA: Diagnosis not present

## 2018-04-14 DIAGNOSIS — F250 Schizoaffective disorder, bipolar type: Secondary | ICD-10-CM | POA: Diagnosis not present

## 2018-06-29 ENCOUNTER — Telehealth: Payer: Self-pay | Admitting: Family Medicine

## 2018-06-29 NOTE — Telephone Encounter (Signed)
Left message for patient to return call back. [facility] may give and obtain information. Patient needs to set up follow up appointment. Or if he has switch PCP's change in system

## 2018-06-29 NOTE — Telephone Encounter (Signed)
Please call to get follow up scheduled:  Due for:  A1c and other labs  Urine microalbumin  Pneumonia vaccine  Opthalmology exam  Foot exam

## 2018-10-23 DIAGNOSIS — F250 Schizoaffective disorder, bipolar type: Secondary | ICD-10-CM | POA: Diagnosis not present

## 2018-11-10 DIAGNOSIS — F250 Schizoaffective disorder, bipolar type: Secondary | ICD-10-CM | POA: Diagnosis not present

## 2018-12-04 ENCOUNTER — Emergency Department
Admission: EM | Admit: 2018-12-04 | Discharge: 2018-12-04 | Disposition: A | Discharge status: Home / Self Care | Payer: Medicare Other | Attending: Student in an Organized Health Care Education/Training Program | Admitting: Student in an Organized Health Care Education/Training Program

## 2018-12-04 ENCOUNTER — Other Ambulatory Visit: Payer: Self-pay

## 2018-12-04 ENCOUNTER — Emergency Department: Payer: Medicare Other

## 2018-12-04 DIAGNOSIS — E119 Type 2 diabetes mellitus without complications: Secondary | ICD-10-CM | POA: Diagnosis not present

## 2018-12-04 DIAGNOSIS — I82442 Acute embolism and thrombosis of left tibial vein: Secondary | ICD-10-CM | POA: Diagnosis not present

## 2018-12-04 DIAGNOSIS — M7989 Other specified soft tissue disorders: Secondary | ICD-10-CM | POA: Diagnosis not present

## 2018-12-04 DIAGNOSIS — M79662 Pain in left lower leg: Secondary | ICD-10-CM | POA: Diagnosis present

## 2018-12-04 DIAGNOSIS — F17210 Nicotine dependence, cigarettes, uncomplicated: Secondary | ICD-10-CM | POA: Insufficient documentation

## 2018-12-04 DIAGNOSIS — Z7984 Long term (current) use of oral hypoglycemic drugs: Secondary | ICD-10-CM | POA: Diagnosis not present

## 2018-12-04 DIAGNOSIS — M79605 Pain in left leg: Secondary | ICD-10-CM | POA: Diagnosis not present

## 2018-12-04 DIAGNOSIS — Z79899 Other long term (current) drug therapy: Secondary | ICD-10-CM | POA: Diagnosis not present

## 2018-12-04 DIAGNOSIS — I824Y2 Acute embolism and thrombosis of unspecified deep veins of left proximal lower extremity: Secondary | ICD-10-CM | POA: Diagnosis not present

## 2018-12-04 LAB — CBC WITH DIFFERENTIAL/PLATELET
Abs Immature Granulocytes: 0.03 10*3/uL (ref 0.00–0.07)
Basophils Absolute: 0 10*3/uL (ref 0.0–0.1)
Basophils Relative: 0 %
Eosinophils Absolute: 0.2 10*3/uL (ref 0.0–0.5)
Eosinophils Relative: 2 %
HCT: 45.9 % (ref 39.0–52.0)
Hemoglobin: 15.3 g/dL (ref 13.0–17.0)
Immature Granulocytes: 0 %
Lymphocytes Relative: 33 %
Lymphs Abs: 2.9 10*3/uL (ref 0.7–4.0)
MCH: 29.4 pg (ref 26.0–34.0)
MCHC: 33.3 g/dL (ref 30.0–36.0)
MCV: 88.1 fL (ref 80.0–100.0)
Monocytes Absolute: 0.4 10*3/uL (ref 0.1–1.0)
Monocytes Relative: 5 %
Neutro Abs: 5.3 10*3/uL (ref 1.7–7.7)
Neutrophils Relative %: 60 %
Platelets: 175 10*3/uL (ref 150–400)
RBC: 5.21 MIL/uL (ref 4.22–5.81)
RDW: 12.7 % (ref 11.5–15.5)
WBC: 8.8 10*3/uL (ref 4.0–10.5)
nRBC: 0 % (ref 0.0–0.2)

## 2018-12-04 LAB — BASIC METABOLIC PANEL
Anion gap: 9 (ref 5–15)
BUN: 14 mg/dL (ref 6–20)
CO2: 29 mmol/L (ref 22–32)
Calcium: 9.3 mg/dL (ref 8.9–10.3)
Chloride: 98 mmol/L (ref 98–111)
Creatinine, Ser: 1.07 mg/dL (ref 0.61–1.24)
GFR calc Af Amer: >60 mL/min (ref >60)
GFR calc non Af Amer: >60 mL/min (ref >60)
Glucose, Bld: 333 mg/dL — ABNORMAL HIGH (ref 70–99)
Potassium: 3.8 mmol/L (ref 3.5–5.1)
Sodium: 136 mmol/L (ref 135–145)

## 2018-12-04 MED ORDER — APIXABAN 5 MG PO TABS
10.0000 mg | ORAL_TABLET | Freq: Two times a day (BID) | ORAL | Status: DC
  Administered 2018-12-04: 10 mg via ORAL
  Filled 2018-12-04: qty 2

## 2018-12-04 MED ORDER — APIXABAN 5 MG PO TABS: 5.0000 mg | ORAL_TABLET | Freq: Two times a day (BID) | ORAL | 0 refills | Status: DC

## 2018-12-04 NOTE — ED Provider Notes (Signed)
[facility] Emergency Department Provider Note    First MD Initiated Contact with Patient 12/04/18 1620     (approximate)  I have reviewed the triage vital signs and the nursing notes.   HISTORY  Chief Complaint Claudication    HPI Albert Miranda is a 38 y.o. male below listed past medical history presents the ER for evaluation of left calf pain.  States he noted some achiness a few days ago but became more severe today and noticed some swelling.  Denies any history of blood clots.  Denies any chest pain or shortness of breath.  Denies any history of head bleed, GI bleed recent trauma or planned surgeries.  No known family history of bleeding disorders.  States he does smoke cigarettes.  States the pain is mild to moderate.    Past Medical History:  Diagnosis Date  . Bipolar 1 disorder ([place])   . Diabetes mellitus without complication ([place])    J4N 10.1 01/2017   . Herpes   . Hyperglycemia   . Patient denies medical problems    Family History  Problem Relation Age of Onset  . Diabetes Father        dx age 57   . Depression Mother    Past Surgical History:  Procedure Laterality Date  . INCISION AND DRAINAGE ABSCESS     buttock gluteal region   . LEG SURGERY     Patient Active Problem List   Diagnosis Date Noted  . Herpes 02/20/2017  . Penile lesion 02/09/2017  . Type 2 diabetes mellitus with hyperglycemia, without long-term current use of insulin ([place]) 02/09/2017  . Elevated blood sugar 10/23/2016  . Schizoaffective disorder ([place]) 05/01/2016  . Cannabis abuse 05/01/2016  . Noncompliance 05/01/2016      Prior to Admission medications   Medication Sig Start Date End Date Taking? Authorizing Provider  apixaban (ELIQUIS) 5 MG TABS tablet Take 1 tablet (5 mg total) by mouth 2 (two) times daily. Take 2 tablets 2x daily for seven days, then 1 2x daily 12/04/18   Merlyn Lot, MD  ARIPiprazole (ABILIFY) 30 MG tablet TAKE 1 TABLET BY MOUTH  EVERYDAY AT BEDTIME 01/03/17   [provider]  clonazePAM (KLONOPIN) 0.5 MG tablet Take 0.5 mg by mouth 2 (two) times daily. 09/17/16   [provider]  lamoTRIgine (LAMICTAL) 200 MG tablet Take 200 mg 2 (two) times daily by mouth.  02/04/17   [provider]  metFORMIN (GLUCOPHAGE) 500 MG tablet Take 1 tablet (500 mg total) by mouth 2 (two) times daily with a meal. 02/20/17   McLean-Scocuzza, Nino Glow, MD  sitaGLIPtin (JANUVIA) 100 MG tablet Take 1 tablet (100 mg total) by mouth daily. 02/20/17   McLean-Scocuzza, Nino Glow, MD  valACYclovir (VALTREX) 500 MG tablet Take 1 tablet (500 mg total) by mouth daily. If you see a blister/outbreak take 1 pill 2x per day x 3 days 02/20/17   McLean-Scocuzza, Nino Glow, MD    Allergies Patient has no known allergies.    Social History Social History   Tobacco Use  . Smoking status: Current Every Day Smoker    Packs/day: 1.00    Types: Cigarettes  . Smokeless tobacco: Never Used  . Tobacco comment: 1-1.5 ppd since since age 28 as of 02/07/17 does not want to quit  Substance Use Topics  . Alcohol use: Yes    Comment: occassional  . Drug use: Yes    Types: Marijuana    Comment: 1-2  x per month     Review of Systems Patient denies headaches, rhinorrhea, blurry vision, numbness, shortness of breath, chest pain, edema, cough, abdominal pain, nausea, vomiting, diarrhea, dysuria, fevers, rashes or hallucinations unless otherwise stated above in HPI. ____________________________________________   PHYSICAL EXAM:  VITAL SIGNS: Vitals:   12/04/18 1321 12/04/18 1637  BP: (!) 136/92 (!) 142/93  Pulse: 94 82  Resp:  18  Temp: 98.5 F (36.9 C)   SpO2: 95% 98%    Constitutional: Alert and oriented.  Eyes: Conjunctivae are normal.  Head: Atraumatic. Nose: No congestion/rhinnorhea. Mouth/Throat: Mucous membranes are moist.   Neck: No stridor. Painless ROM.  Cardiovascular: Normal rate, regular rhythm. Grossly normal heart  sounds.  Good peripheral circulation. Respiratory: Normal respiratory effort.  No retractions. Lungs CTAB. Gastrointestinal: Soft and nontender. No distention. No abdominal bruits. No CVA tenderness. Genitourinary:  Musculoskeletal: 1+ edema to the left lower extremity.  2+ DP and PT pulses bilateral lower extremities.  No overlying erythema or warmth.  No joint effusions. Neurologic:  Normal speech and language. No gross focal neurologic deficits are appreciated. No facial droop Skin:  Skin is warm, dry and intact. No rash noted. Psychiatric: Mood and affect are normal. Speech and behavior are normal.  ____________________________________________   LABS (all labs ordered are listed, but only abnormal results are displayed)  Results for orders placed or performed during the hospital encounter of 12/04/18 (from the past 24 hour(s))  CBC with Differential/Platelet     Status: None   Collection Time: 12/04/18  4:32 PM  Result Value Ref Range   WBC 8.8 4.0 - 10.5 K/uL   RBC 5.21 4.22 - 5.81 MIL/uL   Hemoglobin 15.3 13.0 - 17.0 g/dL   HCT 62.945.9 52.839.0 - 41.352.0 %   MCV 88.1 80.0 - 100.0 fL   MCH 29.4 26.0 - 34.0 pg   MCHC 33.3 30.0 - 36.0 g/dL   RDW 24.412.7 01.011.5 - 27.215.5 %   Platelets 175 150 - 400 K/uL   nRBC 0.0 0.0 - 0.2 %   Neutrophils Relative % 60 %   Neutro Abs 5.3 1.7 - 7.7 K/uL   Lymphocytes Relative 33 %   Lymphs Abs 2.9 0.7 - 4.0 K/uL   Monocytes Relative 5 %   Monocytes Absolute 0.4 0.1 - 1.0 K/uL   Eosinophils Relative 2 %   Eosinophils Absolute 0.2 0.0 - 0.5 K/uL   Basophils Relative 0 %   Basophils Absolute 0.0 0.0 - 0.1 K/uL   Immature Granulocytes 0 %   Abs Immature Granulocytes 0.03 0.00 - 0.07 K/uL  Basic metabolic panel     Status: Abnormal   Collection Time: 12/04/18  4:32 PM  Result Value Ref Range   Sodium 136 135 - 145 mmol/L   Potassium 3.8 3.5 - 5.1 mmol/L   Chloride 98 98 - 111 mmol/L   CO2 29 22 - 32 mmol/L   Glucose, Bld 333 (H) 70 - 99 mg/dL   BUN 14 6 -  20 mg/dL   Creatinine, Ser 5.361.07 0.61 - 1.24 mg/dL   Calcium 9.3 8.9 - 64.410.3 mg/dL   GFR calc non Af Amer >60 >60 mL/min   GFR calc Af Amer >60 >60 mL/min   Anion gap 9 5 - 15   ____________________________________________ ____________________________________________  RADIOLOGY  I personally reviewed all radiographic images ordered to evaluate for the above acute complaints and reviewed radiology reports and findings.  These findings were personally discussed with the patient.  Please see medical  record for radiology report.   ____________________________________________   PROCEDURES  Procedure(s) performed:  Procedures    Critical Care performed: no ____________________________________________   INITIAL IMPRESSION / ASSESSMENT AND PLAN / ED COURSE  Pertinent labs & imaging results that were available during my care of the patient were reviewed by me and considered in my medical decision making (see chart for details).   DDX: dvt, cellulitis, lymphedema  Albert Miranda is a 38 y.o. who presents to the ED with symptoms as described above.  Patient with ultrasound evidence of DVT left lower extremity consistent with the patient's presenting complaints.  Will check basic blood work.  If there are no significant abnormalities will be started on anticoagulation.  Does not appear to have or report any barriers to accessing medication or out patient follow up.  Have discussed with the patient and available family all diagnostics and treatments performed thus far and all questions were answered to the best of my ability. The patient demonstrates understanding and agreement with plan.      The patient was evaluated in Emergency Department today for the symptoms described in the history of present illness. He/she was evaluated in the context of the global COVID-19 pandemic, which necessitated consideration that the patient might be at risk for infection with the SARS-CoV-2 virus that  causes COVID-19. Institutional protocols and algorithms that pertain to the evaluation of patients at risk for COVID-19 are in a state of rapid change based on information released by regulatory bodies including the CDC and federal and state organizations. These policies and algorithms were followed during the patient's care in the ED.  As part of my medical decision making, I reviewed the following data within the electronic MEDICAL RECORD NUMBER Nursing notes reviewed and incorporated, Labs reviewed, notes from prior ED visits and [place] Controlled Substance Database   ____________________________________________   FINAL CLINICAL IMPRESSION(S) / ED DIAGNOSES  Final diagnoses:  Acute deep vein thrombosis (DVT) of proximal vein of left lower extremity (HCC)      NEW MEDICATIONS STARTED DURING THIS VISIT:  Current Discharge Medication List    START taking these medications   Details  apixaban (ELIQUIS) 5 MG TABS tablet Take 1 tablet (5 mg total) by mouth 2 (two) times daily. Take 2 tablets 2x daily for seven days, then 1 2x daily Qty: 60 tablet, Refills: 0         Note:  This document was prepared using Dragon voice recognition software and may include unintentional dictation errors.    Willy Eddy, MD 12/04/18 [phone]

## 2018-12-04 NOTE — ED Notes (Signed)
Pt signed paper copy of discharge 

## 2018-12-04 NOTE — ED Triage Notes (Signed)
Reports he awoke with left calf pain this AM,worse with walking. Sensitive to touch. Pt alert and oriented X4, cooperative, RR even and unlabored, color WNL. Pt in NAD. Does not take blood thinners. Denies CP or SOB.

## 2018-12-08 ENCOUNTER — Telehealth: Payer: Self-pay | Admitting: *Deleted

## 2018-12-08 NOTE — Telephone Encounter (Signed)
Tried to reach patient no answer and no voicemail. 

## 2018-12-09 DIAGNOSIS — F250 Schizoaffective disorder, bipolar type: Secondary | ICD-10-CM | POA: Diagnosis not present

## 2018-12-10 ENCOUNTER — Ambulatory Visit: Payer: Medicare Other | Admitting: Family Medicine

## 2018-12-11 ENCOUNTER — Encounter: Payer: Self-pay | Admitting: Family Medicine

## 2018-12-11 ENCOUNTER — Other Ambulatory Visit: Payer: Self-pay

## 2018-12-11 ENCOUNTER — Ambulatory Visit (INDEPENDENT_AMBULATORY_CARE_PROVIDER_SITE_OTHER): Payer: Medicare Other | Admitting: Family Medicine

## 2018-12-11 VITALS — BP 146/84 | HR 96 | Temp 97.2°F | Resp 20 | Ht 71.5 in | Wt 255.0 lb

## 2018-12-11 DIAGNOSIS — A630 Anogenital (venereal) warts: Secondary | ICD-10-CM

## 2018-12-11 MED ORDER — IMIQUIMOD 5 % EX CREA: TOPICAL_CREAM | CUTANEOUS | 0 refills | Status: DC

## 2018-12-11 NOTE — Patient Instructions (Signed)
Genital Warts Genital warts are small growths in the area around the genitals or the anus. They are caused by a type of germ (HPV virus). This germ is spread from person to person during sex. It can be spread through vaginal, anal, and oral sex. Genital warts can lead to other problems if they are not treated. A person is more likely to have this condition if he or she:  Has sex without using a condom.  Has sex with many people.  Has sex before the age of 16.  Has a weak body defense (immune) system. This condition can be treated with medicines. Your doctor may also burn or freeze the warts. In some cases, surgery may be done to remove the warts. Follow these instructions at home: Medicines   Apply over-the-counter and prescription medicines only as told by your doctor.  Do not use medicines that are meant for treating hand warts.  Talk with your doctor about using creams to treat itching. Instructions for women  Plan to have regular tests to check for cervical cancer. Your risk for this cancer increases when you have genital warts.  If you become pregnant, tell your doctor that you have had genital warts. The germ can be passed to the baby. General instructions  Do not touch or scratch the warts.  Do not have sex until your treatment is done.  Tell your current and past sexual partners about your condition. They may need treatment.  After treatment, use condoms during sex.  Keep all follow-up visits as told by your doctor. This is important. How is this prevented? Talk with your doctor about getting the HPV shot. The HPV shot:  Can help stop some HPV infections and cancers.  Is given to males and females who are 11-26 years old.  Will not work if you already have HPV.  Is not recommended for pregnant women. Contact a doctor if:  You have redness, swelling, or pain in the area of the treated skin.  You have a fever.  You feel sick.  You feel lumps in the area  around your genitals or anus.  You have bleeding in the area around your genitals or anus.  You have pain during sex. Summary  Genital warts are small growths in the areas around the genitals or the anus. They are caused by a type of germ (HPV virus).  The germ is spread by having vaginal, anal, or oral sex without using a condom.  This condition is treated using medicines. In some cases, freezing, burning, or surgery may be done to get rid of the warts.  This condition may be prevented by getting a HPV shot. This information is not intended to replace advice given to you by your health care provider. Make sure you discuss any questions you have with your health care provider. Document Released: 06/05/2009 Document Revised: 04/15/2017 Document Reviewed: 04/15/2017 Elsevier Patient Education  2020 Elsevier Inc.  

## 2018-12-11 NOTE — Progress Notes (Signed)
Subjective:    Patient ID: Albert Miranda, male    DOB: Jul 01, 1980, 38 y.o.   MRN: [id number]  HPI   Patient presents to clinic due to white lesions and head of penis.  States these lesions have been present for about 20 years.  He tried a gel heel of the Internet, but states they were not effective.  Denies any problems urinating.  Denies any pain.  Patient also does have a history of herpes.  Uses Valtrex as needed.  Patient Active Problem List   Diagnosis Date Noted  . Herpes 02/20/2017  . Penile lesion 02/09/2017  . Type 2 diabetes mellitus with hyperglycemia, without long-term current use of insulin (HCC) 02/09/2017  . Elevated blood sugar 10/23/2016  . Schizoaffective disorder (HCC) 05/01/2016  . Cannabis abuse 05/01/2016  . Noncompliance 05/01/2016   Social History   Tobacco Use  . Smoking status: Current Every Day Smoker    Packs/day: 1.00    Types: Cigarettes  . Smokeless tobacco: Never Used  . Tobacco comment: 1-1.5 ppd since since age 38 as of 02/07/17 does not want to quit  Substance Use Topics  . Alcohol use: Yes    Comment: occassional   Review of Systems  Constitutional: Negative for chills, fatigue and fever.  HENT: Negative for congestion, ear pain, sinus pain and sore throat.   Eyes: Negative.   Respiratory: Negative for cough, shortness of breath and wheezing.   Cardiovascular: Negative for chest pain, palpitations and leg swelling.  Gastrointestinal: Negative for abdominal pain, diarrhea, nausea and vomiting.  Genitourinary: Negative for dysuria, frequency and urgency. +lesions on penis x20 years Musculoskeletal: Negative for arthralgias and myalgias.  Skin: Negative for color change, pallor and rash.  Neurological: Negative for syncope, light-headedness and headaches.  Psychiatric/Behavioral: The patient is not nervous/anxious.       Objective:   Physical Exam Vitals signs and nursing note reviewed.  Constitutional:      General: He is not in  acute distress.    Appearance: He is not ill-appearing, toxic-appearing or diaphoretic.  HENT:     Head: Normocephalic and atraumatic.  Cardiovascular:     Rate and Rhythm: Normal rate and regular rhythm.  Pulmonary:     Effort: Pulmonary effort is normal. No respiratory distress.     Breath sounds: Normal breath sounds.  Genitourinary:    Comments: Small white lesions on underside of head of penis. Appears consistent with genital warts. Skin:    General: Skin is warm and dry.     Coloration: Skin is not pale.  Neurological:     Mental Status: He is alert and oriented to person, place, and time.  Psychiatric:        Mood and Affect: Mood normal.        Behavior: Behavior normal.    Today's Vitals   12/11/18 1036  BP: (!) 146/84  Pulse: 96  Resp: 20  Temp: (!) 97.2 F (36.2 C)  TempSrc: Temporal  SpO2: 98%  Weight: 255 lb (115.7 kg)  Height: 5' 11.5" (1.816 m)   Body mass index is 35.07 kg/m.      Assessment and Plan:  1. Genital warts We will refer to derm for eval and possible removal. He will trial aldara cream topically to treat. Safe sex practices reviewed. Informational handout given also to patient.  - Ambulatory referral to Dermatology - imiquimod (ALDARA) 5 % cream; Apply topically 3 (three) times a week.  Dispense: 12 each; Refill: 0  He will otherwise keep all regular follow ups with PCP as planned.

## 2019-01-06 DIAGNOSIS — F250 Schizoaffective disorder, bipolar type: Secondary | ICD-10-CM | POA: Diagnosis not present

## 2019-01-19 ENCOUNTER — Inpatient Hospital Stay
Admit: 2019-01-19 | Discharge: 2019-01-19 | Disposition: A | Discharge status: Home / Self Care | Payer: Medicare Other | Attending: Family Medicine | Admitting: Family Medicine

## 2019-01-19 ENCOUNTER — Other Ambulatory Visit (INDEPENDENT_AMBULATORY_CARE_PROVIDER_SITE_OTHER): Payer: Self-pay | Admitting: Vascular Surgery

## 2019-01-19 ENCOUNTER — Other Ambulatory Visit: Payer: Self-pay

## 2019-01-19 ENCOUNTER — Emergency Department: Payer: Medicare Other

## 2019-01-19 ENCOUNTER — Inpatient Hospital Stay
Admission: EM | Admit: 2019-01-19 | Discharge: 2019-01-22 | DRG: 164 | Disposition: A | Discharge status: Home / Self Care | Payer: Medicare Other | Attending: Internal Medicine | Admitting: Internal Medicine

## 2019-01-19 ENCOUNTER — Inpatient Hospital Stay: Payer: Medicare Other

## 2019-01-19 ENCOUNTER — Encounter: Payer: Self-pay | Admitting: Emergency Medicine

## 2019-01-19 DIAGNOSIS — R000 Tachycardia, unspecified: Secondary | ICD-10-CM | POA: Diagnosis not present

## 2019-01-19 DIAGNOSIS — Z86718 Personal history of other venous thrombosis and embolism: Secondary | ICD-10-CM | POA: Diagnosis not present

## 2019-01-19 DIAGNOSIS — F319 Bipolar disorder, unspecified: Secondary | ICD-10-CM | POA: Diagnosis present

## 2019-01-19 DIAGNOSIS — I82409 Acute embolism and thrombosis of unspecified deep veins of unspecified lower extremity: Secondary | ICD-10-CM | POA: Diagnosis not present

## 2019-01-19 DIAGNOSIS — Z7901 Long term (current) use of anticoagulants: Secondary | ICD-10-CM

## 2019-01-19 DIAGNOSIS — I2602 Saddle embolus of pulmonary artery with acute cor pulmonale: Principal | ICD-10-CM | POA: Diagnosis present

## 2019-01-19 DIAGNOSIS — J439 Emphysema, unspecified: Secondary | ICD-10-CM | POA: Diagnosis present

## 2019-01-19 DIAGNOSIS — F17210 Nicotine dependence, cigarettes, uncomplicated: Secondary | ICD-10-CM | POA: Diagnosis present

## 2019-01-19 DIAGNOSIS — Z9114 Patient's other noncompliance with medication regimen: Secondary | ICD-10-CM

## 2019-01-19 DIAGNOSIS — Z9119 Patient's noncompliance with other medical treatment and regimen: Secondary | ICD-10-CM

## 2019-01-19 DIAGNOSIS — Z818 Family history of other mental and behavioral disorders: Secondary | ICD-10-CM | POA: Diagnosis not present

## 2019-01-19 DIAGNOSIS — Z599 Problem related to housing and economic circumstances, unspecified: Secondary | ICD-10-CM

## 2019-01-19 DIAGNOSIS — Z7984 Long term (current) use of oral hypoglycemic drugs: Secondary | ICD-10-CM

## 2019-01-19 DIAGNOSIS — E119 Type 2 diabetes mellitus without complications: Secondary | ICD-10-CM | POA: Diagnosis present

## 2019-01-19 DIAGNOSIS — R0902 Hypoxemia: Secondary | ICD-10-CM | POA: Diagnosis present

## 2019-01-19 DIAGNOSIS — I82412 Acute embolism and thrombosis of left femoral vein: Secondary | ICD-10-CM | POA: Diagnosis present

## 2019-01-19 DIAGNOSIS — Z79899 Other long term (current) drug therapy: Secondary | ICD-10-CM | POA: Diagnosis not present

## 2019-01-19 DIAGNOSIS — Z833 Family history of diabetes mellitus: Secondary | ICD-10-CM | POA: Diagnosis not present

## 2019-01-19 DIAGNOSIS — M7989 Other specified soft tissue disorders: Secondary | ICD-10-CM | POA: Diagnosis not present

## 2019-01-19 DIAGNOSIS — I2699 Other pulmonary embolism without acute cor pulmonale: Secondary | ICD-10-CM | POA: Diagnosis not present

## 2019-01-19 DIAGNOSIS — I82502 Chronic embolism and thrombosis of unspecified deep veins of left lower extremity: Secondary | ICD-10-CM

## 2019-01-19 DIAGNOSIS — F418 Other specified anxiety disorders: Secondary | ICD-10-CM | POA: Diagnosis present

## 2019-01-19 DIAGNOSIS — I2782 Chronic pulmonary embolism: Secondary | ICD-10-CM

## 2019-01-19 DIAGNOSIS — R0602 Shortness of breath: Secondary | ICD-10-CM | POA: Diagnosis not present

## 2019-01-19 DIAGNOSIS — J9601 Acute respiratory failure with hypoxia: Secondary | ICD-10-CM | POA: Diagnosis not present

## 2019-01-19 DIAGNOSIS — Z20828 Contact with and (suspected) exposure to other viral communicable diseases: Secondary | ICD-10-CM | POA: Diagnosis present

## 2019-01-19 DIAGNOSIS — Z23 Encounter for immunization: Secondary | ICD-10-CM

## 2019-01-19 DIAGNOSIS — I82432 Acute embolism and thrombosis of left popliteal vein: Secondary | ICD-10-CM | POA: Diagnosis not present

## 2019-01-19 LAB — CBC WITH DIFFERENTIAL/PLATELET
Abs Immature Granulocytes: 0.04 10*3/uL (ref 0.00–0.07)
Basophils Absolute: 0.1 10*3/uL (ref 0.0–0.1)
Basophils Relative: 0 %
Eosinophils Absolute: 0.1 10*3/uL (ref 0.0–0.5)
Eosinophils Relative: 1 %
HCT: 45.2 % (ref 39.0–52.0)
Hemoglobin: 15.4 g/dL (ref 13.0–17.0)
Immature Granulocytes: 0 %
Lymphocytes Relative: 22 %
Lymphs Abs: 2.6 10*3/uL (ref 0.7–4.0)
MCH: 29.4 pg (ref 26.0–34.0)
MCHC: 34.1 g/dL (ref 30.0–36.0)
MCV: 86.3 fL (ref 80.0–100.0)
Monocytes Absolute: 0.7 10*3/uL (ref 0.1–1.0)
Monocytes Relative: 6 %
Neutro Abs: 8.4 10*3/uL — ABNORMAL HIGH (ref 1.7–7.7)
Neutrophils Relative %: 71 %
Platelets: 174 10*3/uL (ref 150–400)
RBC: 5.24 MIL/uL (ref 4.22–5.81)
RDW: 13.2 % (ref 11.5–15.5)
WBC: 11.9 10*3/uL — ABNORMAL HIGH (ref 4.0–10.5)
nRBC: 0 % (ref 0.0–0.2)

## 2019-01-19 LAB — BRAIN NATRIURETIC PEPTIDE: B Natriuretic Peptide: 18 pg/mL (ref 0.0–100.0)

## 2019-01-19 LAB — BASIC METABOLIC PANEL
Anion gap: 9 (ref 5–15)
BUN: 11 mg/dL (ref 6–20)
CO2: 28 mmol/L (ref 22–32)
Calcium: 9 mg/dL (ref 8.9–10.3)
Chloride: 96 mmol/L — ABNORMAL LOW (ref 98–111)
Creatinine, Ser: 1 mg/dL (ref 0.61–1.24)
GFR calc Af Amer: >60 mL/min (ref >60)
GFR calc non Af Amer: >60 mL/min (ref >60)
Glucose, Bld: 371 mg/dL — ABNORMAL HIGH (ref 70–99)
Potassium: 4.1 mmol/L (ref 3.5–5.1)
Sodium: 133 mmol/L — ABNORMAL LOW (ref 135–145)

## 2019-01-19 LAB — TROPONIN I (HIGH SENSITIVITY)
Troponin I (High Sensitivity): 5 ng/L (ref <18)
Troponin I (High Sensitivity): 6 ng/L (ref <18)

## 2019-01-19 LAB — GLUCOSE, CAPILLARY
Glucose-Capillary: 343 mg/dL — ABNORMAL HIGH (ref 70–99)
Glucose-Capillary: 248 mg/dL — ABNORMAL HIGH (ref 70–99)
Glucose-Capillary: 336 mg/dL — ABNORMAL HIGH (ref 70–99)
Glucose-Capillary: 318 mg/dL — ABNORMAL HIGH (ref 70–99)

## 2019-01-19 LAB — SARS CORONAVIRUS 2 BY RT PCR (HOSPITAL ORDER, PERFORMED IN ~~LOC~~ HOSPITAL LAB): SARS Coronavirus 2: NEGATIVE

## 2019-01-19 LAB — APTT: aPTT: 26 seconds (ref 24–36)

## 2019-01-19 LAB — HEMOGLOBIN A1C
Hgb A1c MFr Bld: 13.4 % — ABNORMAL HIGH (ref 4.8–5.6)
Mean Plasma Glucose: 337.88 mg/dL

## 2019-01-19 LAB — MRSA PCR SCREENING: MRSA by PCR: NEGATIVE

## 2019-01-19 LAB — PROTIME-INR
INR: 1 (ref 0.8–1.2)
Prothrombin Time: 13 seconds (ref 11.4–15.2)

## 2019-01-19 LAB — HEPARIN LEVEL (UNFRACTIONATED)
Heparin Unfractionated: 0.53 IU/mL (ref 0.30–0.70)
Heparin Unfractionated: 0.23 IU/mL — ABNORMAL LOW (ref 0.30–0.70)

## 2019-01-19 LAB — HIV ANTIBODY (ROUTINE TESTING W REFLEX): HIV Screen 4th Generation wRfx: NONREACTIVE

## 2019-01-19 MED ORDER — ONDANSETRON HCL 4 MG PO TABS: 4.0000 mg | ORAL_TABLET | Freq: Four times a day (QID) | ORAL | Status: DC | PRN

## 2019-01-19 MED ORDER — CHLORHEXIDINE GLUCONATE CLOTH 2 % EX PADS
6.0000 | MEDICATED_PAD | Freq: Every day | CUTANEOUS | Status: DC
  Administered 2019-01-20: 6 via TOPICAL

## 2019-01-19 MED ORDER — ONDANSETRON HCL 4 MG/2ML IJ SOLN: 4.0000 mg | Freq: Four times a day (QID) | INTRAMUSCULAR | Status: DC | PRN

## 2019-01-19 MED ORDER — MAGNESIUM HYDROXIDE 400 MG/5ML PO SUSP: 30.0000 mL | Freq: Every day | ORAL | Status: DC | PRN

## 2019-01-19 MED ORDER — SODIUM CHLORIDE 0.9 % IV SOLN
INTRAVENOUS | Status: DC
  Administered 2019-01-19: 07:00:00 via INTRAVENOUS

## 2019-01-19 MED ORDER — ARIPIPRAZOLE 15 MG PO TABS
30.0000 mg | ORAL_TABLET | Freq: Every day | ORAL | Status: DC
  Administered 2019-01-19 – 2019-01-22 (×3): 30 mg via ORAL
  Filled 2019-01-19 (×4): qty 2

## 2019-01-19 MED ORDER — IMIQUIMOD 5 % EX CREA: TOPICAL_CREAM | CUTANEOUS | Status: DC

## 2019-01-19 MED ORDER — HEPARIN BOLUS VIA INFUSION
5000.0000 [IU] | Freq: Once | INTRAVENOUS | Status: AC
  Administered 2019-01-19: 5000 [IU] via INTRAVENOUS
  Filled 2019-01-19: qty 5000

## 2019-01-19 MED ORDER — BENZONATATE 100 MG PO CAPS
200.0000 mg | ORAL_CAPSULE | Freq: Three times a day (TID) | ORAL | Status: DC | PRN
  Administered 2019-01-19 – 2019-01-21 (×2): 200 mg via ORAL
  Filled 2019-01-19 (×2): qty 2

## 2019-01-19 MED ORDER — SODIUM CHLORIDE 0.9 % IV SOLN
INTRAVENOUS | Status: DC
  Administered 2019-01-20 – 2019-01-21 (×3): via INTRAVENOUS

## 2019-01-19 MED ORDER — ACETAMINOPHEN 325 MG PO TABS
650.0000 mg | ORAL_TABLET | Freq: Four times a day (QID) | ORAL | Status: DC | PRN
  Administered 2019-01-21 – 2019-01-22 (×2): 650 mg via ORAL
  Filled 2019-01-19 (×2): qty 2

## 2019-01-19 MED ORDER — CLONAZEPAM 0.5 MG PO TABS
0.5000 mg | ORAL_TABLET | Freq: Two times a day (BID) | ORAL | Status: DC
  Administered 2019-01-19 – 2019-01-22 (×7): 0.5 mg via ORAL
  Filled 2019-01-19 (×7): qty 1

## 2019-01-19 MED ORDER — LINAGLIPTIN 5 MG PO TABS
5.0000 mg | ORAL_TABLET | Freq: Every day | ORAL | Status: DC
  Administered 2019-01-19 – 2019-01-21 (×2): 5 mg via ORAL
  Filled 2019-01-19 (×3): qty 1

## 2019-01-19 MED ORDER — INSULIN ASPART 100 UNIT/ML ~~LOC~~ SOLN
0.0000 [IU] | Freq: Three times a day (TID) | SUBCUTANEOUS | Status: DC
  Administered 2019-01-19: 5 [IU] via SUBCUTANEOUS
  Administered 2019-01-19 (×2): 11 [IU] via SUBCUTANEOUS
  Administered 2019-01-20: 5 [IU] via SUBCUTANEOUS
  Administered 2019-01-20 – 2019-01-21 (×2): 11 [IU] via SUBCUTANEOUS
  Administered 2019-01-21: 2 [IU] via SUBCUTANEOUS
  Administered 2019-01-21: 8 [IU] via SUBCUTANEOUS
  Administered 2019-01-22: 3 [IU] via SUBCUTANEOUS
  Filled 2019-01-19 (×10): qty 1

## 2019-01-19 MED ORDER — ACETAMINOPHEN 650 MG RE SUPP: 650.0000 mg | Freq: Four times a day (QID) | RECTAL | Status: DC | PRN

## 2019-01-19 MED ORDER — HEPARIN (PORCINE) 25000 UT/250ML-% IV SOLN
1900.0000 [IU]/h | INTRAVENOUS | Status: DC
  Administered 2019-01-19 (×2): 1600 [IU]/h via INTRAVENOUS
  Administered 2019-01-20 (×2): 1800 [IU]/h via INTRAVENOUS
  Filled 2019-01-19 (×4): qty 250

## 2019-01-19 MED ORDER — IOHEXOL 350 MG/ML SOLN
75.0000 mL | Freq: Once | INTRAVENOUS | Status: AC | PRN
  Administered 2019-01-19: 75 mL via INTRAVENOUS

## 2019-01-19 MED ORDER — HEPARIN BOLUS VIA INFUSION
1500.0000 [IU] | Freq: Once | INTRAVENOUS | Status: AC
  Administered 2019-01-19: 1500 [IU] via INTRAVENOUS
  Filled 2019-01-19: qty 1500

## 2019-01-19 MED ORDER — HEPARIN (PORCINE) 25000 UT/250ML-% IV SOLN: 1000.0000 [IU]/h | INTRAVENOUS | Status: DC

## 2019-01-19 MED ORDER — LAMOTRIGINE 100 MG PO TABS
200.0000 mg | ORAL_TABLET | Freq: Two times a day (BID) | ORAL | Status: DC
  Administered 2019-01-19 – 2019-01-21 (×5): 200 mg via ORAL
  Filled 2019-01-19 (×5): qty 2

## 2019-01-19 MED ORDER — TRAZODONE HCL 50 MG PO TABS: 25.0000 mg | ORAL_TABLET | Freq: Every evening | ORAL | Status: DC | PRN

## 2019-01-19 MED ORDER — PNEUMOCOCCAL VAC POLYVALENT 25 MCG/0.5ML IJ INJ
0.5000 mL | INJECTION | INTRAMUSCULAR | Status: AC
  Administered 2019-01-20: 0.5 mL via INTRAMUSCULAR
  Filled 2019-01-19: qty 0.5

## 2019-01-19 NOTE — ED Notes (Signed)
Pt given phone to speak with pharmacy to update medications.

## 2019-01-19 NOTE — ED Provider Notes (Signed)
[facility] Emergency Department Provider Note   ____________________________________________   First MD Initiated Contact with Patient 01/19/19 [phone]     (approximate)  I have reviewed the triage vital signs and the nursing notes.   HISTORY  Chief Complaint Leg Swelling    HPI Albert Miranda is a 38 y.o. male with past medical history of diabetes, bipolar disorder, and DVT presents to the ED complaining of leg swelling.  Patient reports that he was initially diagnosed with a clot in his left leg about 6 weeks ago when he came to the ER for left leg swelling and pain.  He was given a 1 month supply of Eliquis and reports completing this, but has been unable to schedule follow-up or get a refill of blood thinners since then.  Over the past 24 hours he has noticed increasing pain in his left calf as well as some associated swelling.  He denies any overlying skin changes.  He also states that he has had mild pain in the center of his chest for the past 4 to 5 days.  He describes it as sharp but not exacerbated or alleviated by anything.  He denies any associated fevers, cough, or shortness of breath.  He has not had any recent surgery, denies history of malignancy or family history of clotting disorder.        Past Medical History:  Diagnosis Date  . Bipolar 1 disorder (HCC)   . Diabetes mellitus without complication (HCC)    A1C 10.1 01/2017   . Herpes   . Hyperglycemia   . Patient denies medical problems     Patient Active Problem List   Diagnosis Date Noted  . Herpes 02/20/2017  . Penile lesion 02/09/2017  . Type 2 diabetes mellitus with hyperglycemia, without long-term current use of insulin (HCC) 02/09/2017  . Elevated blood sugar 10/23/2016  . Schizoaffective disorder (HCC) 05/01/2016  . Cannabis abuse 05/01/2016  . Noncompliance 05/01/2016    Past Surgical History:  Procedure Laterality Date  . INCISION AND DRAINAGE ABSCESS     buttock  gluteal region   . LEG SURGERY      Prior to Admission medications   Medication Sig Start Date End Date Taking? Authorizing Provider  apixaban (ELIQUIS) 5 MG TABS tablet Take 1 tablet (5 mg total) by mouth 2 (two) times daily. Take 2 tablets 2x daily for seven days, then 1 2x daily 12/04/18   Willy Eddyobinson, Patrick, MD  ARIPiprazole (ABILIFY) 30 MG tablet TAKE 1 TABLET BY MOUTH EVERYDAY AT BEDTIME 01/03/17   [provider]  clonazePAM (KLONOPIN) 0.5 MG tablet Take 0.5 mg by mouth 2 (two) times daily. 09/17/16   [provider]  imiquimod (ALDARA) 5 % cream Apply topically 3 (three) times a week. 12/11/18   Tracey HarriesGuse, Lauren M, FNP  lamoTRIgine (LAMICTAL) 200 MG tablet Take 200 mg 2 (two) times daily by mouth.  02/04/17   [provider]  metFORMIN (GLUCOPHAGE) 500 MG tablet Take 1 tablet (500 mg total) by mouth 2 (two) times daily with a meal. 02/20/17   McLean-Scocuzza, Pasty Spillersracy N, MD  sitaGLIPtin (JANUVIA) 100 MG tablet Take 1 tablet (100 mg total) by mouth daily. 02/20/17   McLean-Scocuzza, Pasty Spillersracy N, MD  valACYclovir (VALTREX) 500 MG tablet Take 1 tablet (500 mg total) by mouth daily. If you see a blister/outbreak take 1 pill 2x per day x 3 days 02/20/17   McLean-Scocuzza, Pasty Spillersracy N, MD    Allergies Patient has no  known allergies.  Family History  Problem Relation Age of Onset  . Diabetes Father        dx age 21   . Depression Mother     Social History Social History   Tobacco Use  . Smoking status: Current Every Day Smoker    Packs/day: 1.00    Types: Cigarettes  . Smokeless tobacco: Never Used  . Tobacco comment: 1-1.5 ppd since since age 35 as of 02/07/17 does not want to quit  Substance Use Topics  . Alcohol use: Yes    Comment: occassional  . Drug use: Yes    Types: Marijuana    Comment: 1-2 x per month     Review of Systems  Constitutional: No fever/chills Eyes: No visual changes. ENT: No sore throat. Cardiovascular: Positive for chest pain.  Respiratory: Denies shortness of breath. Gastrointestinal: No abdominal pain.  No nausea, no vomiting.  No diarrhea.  No constipation. Genitourinary: Negative for dysuria. Musculoskeletal: Negative for back pain.  Positive for left leg swelling and pain. Skin: Negative for rash. Neurological: Negative for headaches, focal weakness or numbness.  ____________________________________________   PHYSICAL EXAM:  VITAL SIGNS: ED Triage Vitals [01/19/19 0156]  Enc Vitals Group     BP 140/85     Pulse Rate (!) 110     Resp 18     Temp 98.6 F (37 C)     Temp Source Oral     SpO2 97 %     Weight      Height      Head Circumference      Peak Flow      Pain Score      Pain Loc      Pain Edu?      Excl. in GC?     Constitutional: Alert and oriented. Eyes: Conjunctivae are normal. Head: Atraumatic. Nose: No congestion/rhinnorhea. Mouth/Throat: Mucous membranes are moist. Neck: Normal ROM Cardiovascular: Tachycardic, regular rhythm. Grossly normal heart sounds. Respiratory: Normal respiratory effort.  No retractions. Lungs CTAB. Gastrointestinal: Soft and nontender. No distention. Genitourinary: deferred Musculoskeletal: 1+ woody edema to left calf with associated tenderness. Neurologic:  Normal speech and language. No gross focal neurologic deficits are appreciated. Skin:  Skin is warm, dry and intact. No rash noted. Psychiatric: Mood and affect are normal. Speech and behavior are normal.  ____________________________________________   LABS (all labs ordered are listed, but only abnormal results are displayed)  Labs Reviewed  BASIC METABOLIC PANEL - Abnormal; Notable for the following components:      Result Value   Sodium 133 (*)    Chloride 96 (*)    Glucose, Bld 371 (*)    All other components within normal limits  CBC WITH DIFFERENTIAL/PLATELET - Abnormal; Notable for the following components:   WBC 11.9 (*)    Neutro Abs 8.4 (*)    All other components within  normal limits  SARS CORONAVIRUS 2 (TAT 6-24 HRS)  BRAIN NATRIURETIC PEPTIDE  TROPONIN I (HIGH SENSITIVITY)   ____________________________________________  EKG  ED ECG REPORT I, Chesley Noon, the attending physician, personally viewed and interpreted this ECG.   Date: 01/19/2019  EKG Time: 4:21  Rate: 103  Rhythm: sinus tachycardia  Axis: Normal  Intervals:none  ST&T Change: None    PROCEDURES  Procedure(s) performed (including Critical Care):  .Critical Care Performed by: Chesley Noon, MD Authorized by: Chesley Noon, MD   Critical care provider statement:    Critical care time (minutes):  45   Critical care  time was exclusive of:  Separately billable procedures and treating other patients and teaching time   Critical care was necessary to treat or prevent imminent or life-threatening deterioration of the following conditions:  Cardiac failure   Critical care was time spent personally by me on the following activities:  Discussions with consultants, evaluation of patient's response to treatment, examination of patient, ordering and performing treatments and interventions, ordering and review of laboratory studies, ordering and review of radiographic studies, pulse oximetry, re-evaluation of patient's condition, obtaining history from patient or surrogate and review of old charts   I assumed direction of critical care for this patient from another provider in my specialty: no       ____________________________________________   INITIAL IMPRESSION / ASSESSMENT AND PLAN / ED COURSE       38 year old male with history of DVT, not currently anticoagulated, presents to the ED with increasing left calf pain and swelling.  He is not in any respiratory distress and was noted to have normal O2 sats in triage, however upon being moved to room has an O2 sat in the low 80s on room air.  He does not have any history of COPD and no wheezing noted.  Given his described chest  pain and history of DVT, will CTA for PE.  He remains hemodynamically stable at this time.  CTA shows significant PE occluding much of the right pulmonary artery proximally.  Additionally, there is evidence of right heart strain with RV to LV ratio exceeding 1.  Patient remains hemodynamically stable and troponin is within normal limits.  Patient started on heparin drip and case discussed with hospitalist, who accepts patient for admission.      ____________________________________________   FINAL CLINICAL IMPRESSION(S) / ED DIAGNOSES  Final diagnoses:  Acute saddle pulmonary embolism with acute cor pulmonale (HCC)  Acute deep vein thrombosis (DVT) of femoral vein of left lower extremity [facility])     ED Discharge Orders    None       Note:  This document was prepared using Dragon voice recognition software and may include unintentional dictation errors.   Blake Divine, MD 01/19/19 [phone]

## 2019-01-19 NOTE — ED Notes (Signed)
Pt resting in bed with eyes closed. Pt call bell in reach. Bed in lowest and locked position. Will continue to monitor.

## 2019-01-19 NOTE — ED Notes (Signed)
Heparin administered per orders, maintenance fluids administered per MD order. Heparin verified by Nira Conn, RN. Pt remains alert and oriented at this time. NAD noted. Will continue to monitor for further patient needs.

## 2019-01-19 NOTE — ED Notes (Signed)
Pt HOB lowered and lights turned off so pt can go to sleep.

## 2019-01-19 NOTE — ED Triage Notes (Addendum)
Pt went off of Eliquis 2 weeks ago due to running out of medication. Pt had previous DVT and is to ED tonight due to pain in the lower left calf. Area has swelling compared to right.

## 2019-01-19 NOTE — ED Notes (Signed)
This RN and EDP at bedside. Pt states pain started again today and increased swelling to L calf. Pt dx with DVT and placed on Eliquis, pt states ran out of prescription medication and wasn't able to get a refill so he stopped taking approx 2 weeks ago. Pt presents alert and oriented at this time, NAD noted. Swelling noted to L calf at this time.

## 2019-01-19 NOTE — Progress Notes (Signed)
ANTICOAGULATION CONSULT NOTE - Initial Consult  Pharmacy Consult for Heparin Indication: pulmonary embolus  No Known Allergies  Patient Measurements: Height: 5' 11.5" (181.6 cm) Weight: 255 lb (115.7 kg) IBW/kg (Calculated) : 76.45 HEPARIN DW (KG): 101.6  Vital Signs: BP: 121/65 (10/27 1330) Pulse Rate: 94 (10/27 1330)  Labs: Recent Labs    01/19/19 0434 01/19/19 0624 01/19/19 1254  HGB 15.4  --   --   HCT 45.2  --   --   PLT 174  --   --   APTT 26  --   --   LABPROT 13.0  --   --   INR 1.0  --   --   HEPARINUNFRC  --   --  0.53  CREATININE 1.00  --   --   TROPONINIHS 5 6  --    Estimated Creatinine Clearance: 130.6 mL/min (by C-G formula based on SCr of 1 mg/dL).  Medical History: Past Medical History:  Diagnosis Date  . Bipolar 1 disorder ([place])   . Diabetes mellitus without complication ([place])    E5I 10.1 01/2017   . Herpes   . Hyperglycemia   . Patient denies medical problems    Medications:  Apixaban prior to admission. Per patient took x1 month but has been out of medication for two weeks.   Assessment: 38 y/o M with history of recently diagnosed DVT presents c/o dyspnea, chest pain, and worsening LE pain and swelling. He was prescribed apixaban for treatment as an outpatient but has not taken the medication for approximately two weeks. Work-up significant for extensive sub-massive PE involving right main pulmonary artery and branches with evidence of right heart strain. Plan for pulmonary thrombectomy. He is on the cath lab schedule for 10/28. Pharmacy consulted for heparin drip for PE. Do not anticipate apixaban related interference of heparin level due to last dose ~2 weeks ago.   Goal of Therapy:  Heparin level 0.3-0.7 units/ml Monitor platelets by anticoagulation protocol: Yes   Plan:  -10/27 @ 1254 HL 0.53, therapeutic x1. Continue current rate.  -Check HL in 6 hours  -Daily CBC per protocol -Follow surgery/anticoagulation plan  [place] Resident 01/19/2019,1:56 PM

## 2019-01-19 NOTE — Progress Notes (Signed)
Albert Miranda at [place] NAME: Albert Miranda    MR#:  [id number]  DATE OF BIRTH:  1980/10/20  SUBJECTIVE:  CHIEF COMPLAINT: Patient is reporting heaviness in his chest but denies any chest pain per se, may consider leaving the hospital to get his stuff from home  REVIEW OF SYSTEMS:  CONSTITUTIONAL: No fever, fatigue or weakness.  EYES: No blurred or double vision.  EARS, NOSE, AND THROAT: No tinnitus or ear pain.  RESPIRATORY: No cough, shortness of breath, wheezing or hemoptysis.  CARDIOVASCULAR: No chest pain, orthopnea, edema.  GASTROINTESTINAL: No nausea, vomiting, diarrhea or abdominal pain.  GENITOURINARY: No dysuria, hematuria.  ENDOCRINE: No polyuria, nocturia,  HEMATOLOGY: No anemia, easy bruising or bleeding SKIN: No rash or lesion. MUSCULOSKELETAL: No joint pain or arthritis.   NEUROLOGIC: No tingling, numbness, weakness.  PSYCHIATRY: No anxiety or depression.   DRUG ALLERGIES:  No Known Allergies  VITALS:  Blood pressure 131/83, pulse 98, temperature 97.9 F (36.6 C), temperature source Oral, resp. rate (!) 28, height 5\' 11"  (1.803 m), weight 108.2 kg, SpO2 96 %.  PHYSICAL EXAMINATION:  GENERAL:  38 y.o.-year-old patient lying in the bed with no acute distress.  EYES: Pupils equal, round, reactive to light and accommodation. No scleral icterus. Extraocular muscles intact.  HEENT: Head atraumatic, normocephalic. Oropharynx and nasopharynx clear.  NECK:  Supple, no jugular venous distention. No thyroid enlargement, no tenderness.  LUNGS: Normal breath sounds bilaterally, no wheezing, rales,rhonchi or crepitation. No use of accessory muscles of respiration.  CARDIOVASCULAR: S1, S2 normal. No murmurs, rubs, or gallops.  ABDOMEN: Soft, nontender, nondistended. Bowel sounds present.  EXTREMITIES: No pedal edema, cyanosis, or clubbing.  NEUROLOGIC: Cranial nerves II through XII are intact. Muscle strength 5/5 in all  extremities. Sensation intact. Gait not checked.  PSYCHIATRIC: The patient is alert and oriented x 3.  SKIN: No obvious rash, lesion, or ulcer.    LABORATORY PANEL:   CBC Recent Labs  Lab 01/19/19 0434  WBC 11.9*  HGB 15.4  HCT 45.2  PLT 174   ------------------------------------------------------------------------------------------------------------------  Chemistries  Recent Labs  Lab 01/19/19 0434  NA 133*  K 4.1  CL 96*  CO2 28  GLUCOSE 371*  BUN 11  CREATININE 1.00  CALCIUM 9.0   ------------------------------------------------------------------------------------------------------------------  Cardiac Enzymes No results for input(s): TROPONINI in the last 168 hours. ------------------------------------------------------------------------------------------------------------------  RADIOLOGY:  Ct Angio Chest Pe W/cm &/or Wo Cm  Result Date: 01/19/2019 CLINICAL DATA:  Chest pain, shortness of breath and DVT. EXAM: CT ANGIOGRAPHY CHEST WITH CONTRAST TECHNIQUE: Multidetector CT imaging of the chest was performed using the standard protocol during bolus administration of intravenous contrast. Multiplanar CT image reconstructions and MIPs were obtained to evaluate the vascular anatomy. CONTRAST:  35mL OMNIPAQUE IOHEXOL 350 MG/ML SOLN COMPARISON:  None. FINDINGS: Cardiovascular: The heart is normal in size. No pericardial effusion. Changes of right heart strain are noted with flattening of the left ventricle. The RV LV ratio is 1.07. The aorta is normal in caliber. No atherosclerotic calcifications. No dissection. The branch vessels are patent. No coronary artery calcifications. The pulmonary arterial tree is fairly well opacified. There is extensive right-sided pulmonary emboli with large saddle embolus in the right main pulmonary artery and extending into the right upper lobe, right middle lobe and right lower lobe pulmonary arteries. Scattered lower lobe pulmonary emboli  noted on the left side also. Mediastinum/Nodes: No mediastinal or hilar mass or adenopathy. The esophagus is grossly normal. Lungs/Pleura: Bibasilar  subpleural atelectasis but no infiltrates or effusions. No worrisome pulmonary lesions. Upper Abdomen: No significant upper abdominal findings. Musculoskeletal: No chest wall mass, supraclavicular or axillary adenopathy. The thyroid gland appears normal. The bony structures are unremarkable. Review of the MIP images confirms the above findings. IMPRESSION: 1. Extensive, sub massive pulmonary emboli mainly involving the right main pulmonary artery and its branches. Evidence of right heart strain with flattening of the left ventricle and RV LV ratio of 1.07. 2. Normal thoracic aorta. 3. No significant pulmonary findings. These results were called by telephone at the time of interpretation on 01/19/2019 at 5:40 am to provider [facility] , who verbally acknowledged these results. Electronically Signed   By: Rudie MeyerP.  Gallerani M.D.   On: 01/19/2019 05:39   [place] Venous Img Lower Bilateral  Result Date: 01/19/2019 CLINICAL DATA:  38 year old male with PE and prior DVT EXAM: BILATERAL LOWER EXTREMITY VENOUS DOPPLER ULTRASOUND TECHNIQUE: Gray-scale sonography with graded compression, as well as color Doppler and duplex ultrasound were performed to evaluate the lower extremity deep venous systems from the level of the common femoral vein and including the common femoral, femoral, profunda femoral, popliteal and calf veins including the posterior tibial, peroneal and gastrocnemius veins when visible. The superficial great saphenous vein was also interrogated. Spectral Doppler was utilized to evaluate flow at rest and with distal augmentation maneuvers in the common femoral, femoral and popliteal veins. COMPARISON:  01/19/2019, 12/04/2018 FINDINGS: RIGHT LOWER EXTREMITY Common Femoral Vein: No evidence of thrombus. Normal compressibility, respiratory phasicity and response to  augmentation. Saphenofemoral Junction: No evidence of thrombus. Normal compressibility and flow on color Doppler imaging. Profunda Femoral Vein: No evidence of thrombus. Normal compressibility and flow on color Doppler imaging. Femoral Vein: No evidence of thrombus. Normal compressibility, respiratory phasicity and response to augmentation. Popliteal Vein: No evidence of thrombus. Normal compressibility, respiratory phasicity and response to augmentation. Calf Veins: No evidence of thrombus. Normal compressibility and flow on color Doppler imaging. Superficial Great Saphenous Vein: No evidence of thrombus. Normal compressibility and flow on color Doppler imaging. Other Findings:  None. LEFT LOWER EXTREMITY Common Femoral Vein: No evidence of thrombus. Normal compressibility, respiratory phasicity and response to augmentation. Saphenofemoral Junction: No evidence of thrombus. Normal compressibility and flow on color Doppler imaging. Profunda Femoral Vein: No evidence of thrombus. Normal compressibility and flow on color Doppler imaging. Occlusive DVT of the femoral vein below the profunda confluence. Occlusive DVT extends through the femoral vein and popliteal vein into the posterior tibial veins. Superficial Great Saphenous Vein: No evidence of thrombus. Normal compressibility and flow on color Doppler imaging. Other Findings:  None. IMPRESSION: Sonographic survey of the left lower extremity positive for DVT/proximal DVT involving the femoral vein, popliteal vein and extending into the tibial veins. Appearance similar to the recent study. Sonographic survey of the right lower extremity negative for DVT. Electronically Signed   By: Gilmer MorJaime  Wagner D.O.   On: 01/19/2019 11:06   [place] Venous Img Lower Unilateral Left  Result Date: 01/19/2019 CLINICAL DATA:  Left calf pain tonight. Patient ran out of Eliquis 2 weeks ago EXAM: LEFT LOWER EXTREMITY VENOUS DOPPLER ULTRASOUND TECHNIQUE: Gray-scale sonography with graded  compression, as well as color Doppler and duplex ultrasound were performed to evaluate the lower extremity deep venous systems from the level of the common femoral vein and including the common femoral, femoral, profunda femoral, popliteal and calf veins including the posterior tibial, peroneal and gastrocnemius veins when visible. The superficial great saphenous vein was also interrogated. Spectral  Doppler was utilized to evaluate flow at rest and with distal augmentation maneuvers in the common femoral, femoral and popliteal veins. COMPARISON:  12/04/2018 FINDINGS: Contralateral Common Femoral Vein: Respiratory phasicity is normal and symmetric with the symptomatic side. No evidence of thrombus. Normal compressibility. Common Femoral Vein: No evidence of thrombus. Profunda Femoral Vein: No evidence of thrombus. Femoral Vein: Newly seen isoechoic, occlusive thrombus throughout most of the femoral vein. Popliteal Vein: Occlusive thrombus throughout. Calf Veins: Patchy nonocclusive thrombus. IMPRESSION: Progression of DVT in the left leg now extending from the calf to the upper femoral vein. Thrombus is occlusive at the level of the femoral and popliteal veins. Electronically Signed   By: Marnee Spring M.D.   On: 01/19/2019 04:39    EKG:   Orders placed or performed during the hospital encounter of 11/16/16  . EKG 12-Lead  . EKG 12-Lead  . ED EKG within 10 minutes  . ED EKG within 10 minutes  . EKG    ASSESSMENT AND PLAN:   1.  Acute massive PE with subsequent right ventricular strain.  With recent history of lower extremity DVT and noncompliant with Eliquis   on IV heparin , continue the same  Seen by vascular planning to do pulmonary thrombectomy, venous thrombolysis of the lower extremity and IVC filter placement either today or tomorrow.  Appreciate vascular surgery recommendations 2D echo.   Will follow BNP and troponin I for prognostic values.  Significant left lower extremity DVT on  bilateral lower extremity venous duplex Dr. Belia Heman was notified by the admitting physician   2.  Type 2 diabetes mellitus.  He has been off Metformin for a while.  He will be placed on supplemental coverage with subcutaneous NovoLog.  3.  History of depression anxiety and bipolar disorder.  We will restart his Abilify, Lamictal and Klonopin If necessary will consider psychiatry consult  as it is a concern for patient's depression actually made him neglect refilling his Eliquis.  4.  DVT prophylaxis.  He is covered with therapeutic anticoagulation     All the records are reviewed and case discussed with Care Management/Social Workerr. Management plans discussed with the patient, he is in agreement.  He does not have any family members and friends.  Currently disabled CODE STATUS: fc   TOTAL  TIME TAKING CARE OF THIS PATIENT: 35  minutes.   POSSIBLE D/C IN 1-2  DAYS, DEPENDING ON CLINICAL CONDITION.  Note: This dictation was prepared with Dragon dictation along with smaller phrase technology. Any transcriptional errors that result from this process are unintentional.   Ramonita Lab M.D on 01/19/2019 at 4:36 PM  Between 7am to 6pm - Pager - [phone] After 6pm go to [email or web address] - password [email or web address]  [facility]  Office  [phone]  CC: Primary care physician; System, Pcp Not In

## 2019-01-19 NOTE — ED Notes (Signed)
Meal given

## 2019-01-19 NOTE — ED Notes (Signed)
Pt brought to room by [place] tech.

## 2019-01-19 NOTE — ED Notes (Signed)
This RN reviewed signed and held orders, pt admitted to step down by Dr. Sidney Ace, this RN notified Dr. Ellender Hose regarding pt's admission to step down unit, VORB for SARS RT PCR due patient's admission to step down. Lab notified of change in orders, per [facility], [employer] has not sent pt's send out Covid swab yet, able to run PCR off of swab sent by this RN earlier.

## 2019-01-19 NOTE — ED Notes (Signed)
Admitting MD Dr. Sidney Ace at bedside at this time.

## 2019-01-19 NOTE — Progress Notes (Signed)
ANTICOAGULATION CONSULT NOTE - Follow up [facility] for Heparin Indication: pulmonary embolus  No Known Allergies  Patient Measurements: Height: 5\' 11"  (180.3 cm) Weight: 238 lb 8.6 oz (108.2 kg) IBW/kg (Calculated) : 75.3 HEPARIN DW (KG): 98.3  Vital Signs: Temp: 97.9 F (36.6 C) (10/27 1534) Temp Source: Oral (10/27 1534) BP: 131/83 (10/27 1534) Pulse Rate: 101 (10/27 1900)  Labs: Recent Labs    01/19/19 0434 01/19/19 0624 01/19/19 1254 01/19/19 1852  HGB 15.4  --   --   --   HCT 45.2  --   --   --   PLT 174  --   --   --   APTT 26  --   --   --   LABPROT 13.0  --   --   --   INR 1.0  --   --   --   HEPARINUNFRC  --   --  0.53 0.23*  CREATININE 1.00  --   --   --   TROPONINIHS 5 6  --   --    Estimated Creatinine Clearance: 125.4 mL/min (by C-G formula based on SCr of 1 mg/dL).  Medical History: Past Medical History:  Diagnosis Date  . Bipolar 1 disorder ([place])   . Diabetes mellitus without complication ([place])    N0U 10.1 01/2017   . Herpes   . Hyperglycemia   . Patient denies medical problems    Medications:  Apixaban prior to admission. Per patient took x1 month but has been out of medication for two weeks.   Assessment: 38 y/o M with history of recently diagnosed DVT presents c/o dyspnea, chest pain, and worsening LE pain and swelling. He was prescribed apixaban for treatment as an outpatient but has not taken the medication for approximately two weeks. Work-up significant for extensive sub-massive PE involving right main pulmonary artery and branches with evidence of right heart strain. Plan for pulmonary thrombectomy. He is on the cath lab schedule for 10/28. Pharmacy consulted for heparin drip for PE. Do not anticipate apixaban related interference of heparin level due to last dose ~2 weeks ago.   Heparin started with 5000 units IV x1 bolus, drip at 1600 units/hr 10/27 @ 1254 HL 0.53, therapeutic x1.  Goal of Therapy:  Heparin level  0.3-0.7 units/ml Monitor platelets by anticoagulation protocol: Yes   Plan:  -10/27 @ 1852 HL 0.23, subtherapeutic. Per RN no line issues or s/sx of bleeding noted; confirms heparin running at 16 ml/hr. Heparin bolus 1500 units IV x1 then increase heparin drip to 1800 units/hr.  -Check HL in 6 hours  -Daily CBC per protocol -Follow surgery/anticoagulation plan  Pharmacy will continue to follow.   Rayna Sexton, PharmD, BCPS Clinical Pharmacist 01/19/2019 7:33 PM

## 2019-01-19 NOTE — H&P (Signed)
Name: Albert Miranda MRN: [id number] DOB: 11-15-1980     CONSULTATION DATE: 01/19/2019  REFERRING MD :  Dr. Larinda Buttery, Emergency Medicine MD  CHIEF COMPLAINT: DVT and PE    HISTORY OF PRESENT ILLNESS:  Albert Miranda is a 38 year old male who presented to the ED with leg swelling and pain. Pt has a hx of diabetes, bipolar disorder and DVT.  CT chest c/w PE with RT heart strain  No chest pain, no significant hypoxia Patient is not in distress, not in shock    No evidence of heart failure at this time No evidence or signs of infection at this time No respiratory distress No fevers, chills, nausea, vomiting, diarrhea No evidence of lower extremity edema No evidence hemoptysis   PAST MEDICAL HISTORY :   has a past medical history of Bipolar 1 disorder (HCC), Diabetes mellitus without complication (HCC), Herpes, Hyperglycemia, and Patient denies medical problems.  has a past surgical history that includes Leg Surgery and Incision and drainage abscess. Prior to Admission medications   Medication Sig Start Date End Date Taking? Authorizing Provider  apixaban (ELIQUIS) 5 MG TABS tablet Take 1 tablet (5 mg total) by mouth 2 (two) times daily. Take 2 tablets 2x daily for seven days, then 1 2x daily 12/04/18  Yes Willy Eddy, MD  ARIPiprazole (ABILIFY) 30 MG tablet TAKE 1 TABLET BY MOUTH EVERYDAY AT BEDTIME 01/03/17   [provider]  clonazePAM (KLONOPIN) 0.5 MG tablet Take 0.5 mg by mouth 2 (two) times daily. 09/17/16   [provider]  FLUoxetine (PROZAC) 10 MG capsule Take 10 mg by mouth daily. 01/07/19   [provider]  imiquimod (ALDARA) 5 % cream Apply topically 3 (three) times a week. Patient not taking: Reported on 01/19/2019 12/11/18   Tracey Harries, FNP  lamoTRIgine (LAMICTAL) 200 MG tablet Take 200 mg 2 (two) times daily by mouth.  02/04/17   [provider]  metFORMIN (GLUCOPHAGE) 500 MG tablet Take 1 tablet (500 mg total) by mouth 2  (two) times daily with a meal. Patient not taking: Reported on 01/19/2019 02/20/17   McLean-Scocuzza, Pasty Spillers, MD  sitaGLIPtin (JANUVIA) 100 MG tablet Take 1 tablet (100 mg total) by mouth daily. Patient not taking: Reported on 01/19/2019 02/20/17   McLean-Scocuzza, Pasty Spillers, MD  valACYclovir (VALTREX) 500 MG tablet Take 1 tablet (500 mg total) by mouth daily. If you see a blister/outbreak take 1 pill 2x per day x 3 days Patient not taking: Reported on 01/19/2019 02/20/17   McLean-Scocuzza, Pasty Spillers, MD   No Known Allergies  FAMILY HISTORY:  family history includes Depression in his mother; Diabetes in his father. SOCIAL HISTORY:  reports that he has been smoking cigarettes. He has been smoking about 1.00 pack per day. He has never used smokeless tobacco. He reports current alcohol use. He reports current drug use. Drug: Marijuana.   Review of Systems:  Gen:  Denies  fever, sweats, chills weight loss  HEENT: Denies blurred vision, double vision, ear pain, eye pain, hearing loss, nose bleeds, sore throat Cardiac:  No dizziness, chest pain or heaviness, chest tightness,edema, No JVD Resp:   No cough, -sputum production, -shortness of breath,-wheezing, -hemoptysis,  Gi: Denies swallowing difficulty, stomach pain, nausea or vomiting, diarrhea, constipation, bowel incontinence Gu:  Denies bladder incontinence, burning urine Ext:   Denies Joint pain, stiffness or swelling Skin: Denies  skin rash, easy bruising or bleeding or hives Endoc:  Denies polyuria, polydipsia , polyphagia or  weight change Psych:   Denies depression, insomnia or hallucinations  Other:  All other systems negative    VITAL SIGNS: Temp:  [97.9 F (36.6 C)-98.6 F (37 C)] 97.9 F (36.6 C) (10/27 1534) Pulse Rate:  [87-110] 101 (10/27 1534) Resp:  [18-35] 26 (10/27 1534) BP: (93-140)/(65-94) 131/83 (10/27 1534) SpO2:  [87 %-97 %] 97 % (10/27 1534) Weight:  [108.2 kg-115.7 kg] 108.2 kg (10/27 1534)   I/O last 3  completed shifts: In: -  Out: 350 [Urine:350] Total I/O In: 718.6 [I.V.:718.6] Out: 1000 [Urine:1000]   SpO2: 97 % O2 Flow Rate (L/min): 2 L/min   Physical Examination:  GENERAL:critically ill appearing, +resp distress HEAD: Normocephalic, atraumatic.  EYES: Pupils equal, round, reactive to light.  No scleral icterus.  MOUTH: Moist mucosal membrane. NECK: Supple. No JVD.  PULMONARY: +rhonchi, +wheezing CARDIOVASCULAR: S1 and S2. Regular rate and rhythm. No murmurs, rubs, or gallops.  GASTROINTESTINAL: Soft, nontender, -distended. No masses. Positive bowel sounds. No hepatosplenomegaly.  MUSCULOSKELETAL: No swelling, clubbing, or edema.  NEUROLOGIC: obtunded SKIN:intact,warm,dry    The CT chest  was Independently Reviewed By Me Today Extensive filling defects R>L c/w PE    MEDICATIONS: I have reviewed all medications and confirmed regimen as documented   CULTURE RESULTS   Recent Results (from the past 240 hour(s))  SARS Coronavirus 2 by RT PCR (hospital order, performed in [facility] hospital lab) Nasopharyngeal Nasopharyngeal Swab     Status: None   Collection Time: 01/19/19  5:42 AM   Specimen: Nasopharyngeal Swab  Result Value Ref Range Status   SARS Coronavirus 2 NEGATIVE NEGATIVE Final    Comment: (NOTE) If result is NEGATIVE SARS-CoV-2 target nucleic acids are NOT DETECTED. The SARS-CoV-2 RNA is generally detectable in upper and lower  respiratory specimens during the acute phase of infection. The lowest  concentration of SARS-CoV-2 viral copies this assay can detect is 250  copies / mL. A negative result does not preclude SARS-CoV-2 infection  and should not be used as the sole basis for treatment or other  patient management decisions.  A negative result may occur with  improper specimen collection / handling, submission of specimen other  than nasopharyngeal swab, presence of viral mutation(s) within the  areas targeted by this assay, and inadequate  number of viral copies  (<250 copies / mL). A negative result must be combined with clinical  observations, patient history, and epidemiological information. If result is POSITIVE SARS-CoV-2 target nucleic acids are DETECTED. The SARS-CoV-2 RNA is generally detectable in upper and lower  respiratory specimens dur ing the acute phase of infection.  Positive  results are indicative of active infection with SARS-CoV-2.  Clinical  correlation with patient history and other diagnostic information is  necessary to determine patient infection status.  Positive results do  not rule out bacterial infection or co-infection with other viruses. If result is PRESUMPTIVE POSTIVE SARS-CoV-2 nucleic acids MAY BE PRESENT.   A presumptive positive result was obtained on the submitted specimen  and confirmed on repeat testing.  While 2019 novel coronavirus  (SARS-CoV-2) nucleic acids may be present in the submitted sample  additional confirmatory testing may be necessary for epidemiological  and / or clinical management purposes  to differentiate between  SARS-CoV-2 and other Sarbecovirus currently known to infect humans.  If clinically indicated additional testing with an alternate test  methodology [phone]) is advised. The SARS-CoV-2 RNA is generally  detectable in upper and lower respiratory sp ecimens during the acute  phase  of infection. The expected result is Negative. Fact Sheet for Patients:  StrictlyIdeas.no Fact Sheet for Healthcare Providers: BankingDealers.co.za This test is not yet approved or cleared by the [place] FDA and has been authorized for detection and/or diagnosis of SARS-CoV-2 by FDA under an Emergency Use Authorization (EUA).  This EUA will remain in effect (meaning this test can be used) for the duration of the COVID-19 declaration under Section 564(b)(1) of the Act, 21 U.S.C. section 360bbb-3(b)(1), unless the  authorization is terminated or revoked sooner. Performed at [facility], [place], [place] [address]           IMAGING    Ct Angio Chest Pe W/cm &/or Wo Cm  Result Date: 01/19/2019 CLINICAL DATA:  Chest pain, shortness of breath and DVT. EXAM: CT ANGIOGRAPHY CHEST WITH CONTRAST TECHNIQUE: Multidetector CT imaging of the chest was performed using the standard protocol during bolus administration of intravenous contrast. Multiplanar CT image reconstructions and MIPs were obtained to evaluate the vascular anatomy. CONTRAST:  88mL OMNIPAQUE IOHEXOL 350 MG/ML SOLN COMPARISON:  None. FINDINGS: Cardiovascular: The heart is normal in size. No pericardial effusion. Changes of right heart strain are noted with flattening of the left ventricle. The RV LV ratio is 1.07. The aorta is normal in caliber. No atherosclerotic calcifications. No dissection. The branch vessels are patent. No coronary artery calcifications. The pulmonary arterial tree is fairly well opacified. There is extensive right-sided pulmonary emboli with large saddle embolus in the right main pulmonary artery and extending into the right upper lobe, right middle lobe and right lower lobe pulmonary arteries. Scattered lower lobe pulmonary emboli noted on the left side also. Mediastinum/Nodes: No mediastinal or hilar mass or adenopathy. The esophagus is grossly normal. Lungs/Pleura: Bibasilar subpleural atelectasis but no infiltrates or effusions. No worrisome pulmonary lesions. Upper Abdomen: No significant upper abdominal findings. Musculoskeletal: No chest wall mass, supraclavicular or axillary adenopathy. The thyroid gland appears normal. The bony structures are unremarkable. Review of the MIP images confirms the above findings. IMPRESSION: 1. Extensive, sub massive pulmonary emboli mainly involving the right main pulmonary artery and its branches. Evidence of right heart strain with flattening of the left ventricle  and RV LV ratio of 1.07. 2. Normal thoracic aorta. 3. No significant pulmonary findings. These results were called by telephone at the time of interpretation on 01/19/2019 at 5:40 am to provider [facility] , who verbally acknowledged these results. Electronically Signed   By: Marijo Sanes M.D.   On: 01/19/2019 05:39   US Venous Img Lower Bilateral  Result Date: 01/19/2019 CLINICAL DATA:  38 year old male with PE and prior DVT EXAM: BILATERAL LOWER EXTREMITY VENOUS DOPPLER ULTRASOUND TECHNIQUE: Gray-scale sonography with graded compression, as well as color Doppler and duplex ultrasound were performed to evaluate the lower extremity deep venous systems from the level of the common femoral vein and including the common femoral, femoral, profunda femoral, popliteal and calf veins including the posterior tibial, peroneal and gastrocnemius veins when visible. The superficial great saphenous vein was also interrogated. Spectral Doppler was utilized to evaluate flow at rest and with distal augmentation maneuvers in the common femoral, femoral and popliteal veins. COMPARISON:  01/19/2019, 12/04/2018 FINDINGS: RIGHT LOWER EXTREMITY Common Femoral Vein: No evidence of thrombus. Normal compressibility, respiratory phasicity and response to augmentation. Saphenofemoral Junction: No evidence of thrombus. Normal compressibility and flow on color Doppler imaging. Profunda Femoral Vein: No evidence of thrombus. Normal compressibility and flow on color Doppler imaging. Femoral Vein: No evidence  of thrombus. Normal compressibility, respiratory phasicity and response to augmentation. Popliteal Vein: No evidence of thrombus. Normal compressibility, respiratory phasicity and response to augmentation. Calf Veins: No evidence of thrombus. Normal compressibility and flow on color Doppler imaging. Superficial Great Saphenous Vein: No evidence of thrombus. Normal compressibility and flow on color Doppler imaging. Other Findings:   None. LEFT LOWER EXTREMITY Common Femoral Vein: No evidence of thrombus. Normal compressibility, respiratory phasicity and response to augmentation. Saphenofemoral Junction: No evidence of thrombus. Normal compressibility and flow on color Doppler imaging. Profunda Femoral Vein: No evidence of thrombus. Normal compressibility and flow on color Doppler imaging. Occlusive DVT of the femoral vein below the profunda confluence. Occlusive DVT extends through the femoral vein and popliteal vein into the posterior tibial veins. Superficial Great Saphenous Vein: No evidence of thrombus. Normal compressibility and flow on color Doppler imaging. Other Findings:  None. IMPRESSION: Sonographic survey of the left lower extremity positive for DVT/proximal DVT involving the femoral vein, popliteal vein and extending into the tibial veins. Appearance similar to the recent study. Sonographic survey of the right lower extremity negative for DVT. Electronically Signed   By: Gilmer MorJaime  Wagner D.O.   On: 01/19/2019 11:06   [place] Venous Img Lower Unilateral Left  Result Date: 01/19/2019 CLINICAL DATA:  Left calf pain tonight. Patient ran out of Eliquis 2 weeks ago EXAM: LEFT LOWER EXTREMITY VENOUS DOPPLER ULTRASOUND TECHNIQUE: Gray-scale sonography with graded compression, as well as color Doppler and duplex ultrasound were performed to evaluate the lower extremity deep venous systems from the level of the common femoral vein and including the common femoral, femoral, profunda femoral, popliteal and calf veins including the posterior tibial, peroneal and gastrocnemius veins when visible. The superficial great saphenous vein was also interrogated. Spectral Doppler was utilized to evaluate flow at rest and with distal augmentation maneuvers in the common femoral, femoral and popliteal veins. COMPARISON:  12/04/2018 FINDINGS: Contralateral Common Femoral Vein: Respiratory phasicity is normal and symmetric with the symptomatic side. No  evidence of thrombus. Normal compressibility. Common Femoral Vein: No evidence of thrombus. Profunda Femoral Vein: No evidence of thrombus. Femoral Vein: Newly seen isoechoic, occlusive thrombus throughout most of the femoral vein. Popliteal Vein: Occlusive thrombus throughout. Calf Veins: Patchy nonocclusive thrombus. IMPRESSION: Progression of DVT in the left leg now extending from the calf to the upper femoral vein. Thrombus is occlusive at the level of the femoral and popliteal veins. Electronically Signed   By: Marnee SpringJonathon  Watts M.D.   On: 01/19/2019 04:39        ASSESSMENT AND PLAN SYNOPSIS   B/L PE Oxygen as needed Continue heparin infusion Vasc surgery to assess for embolectomy  Ok to transfer to gen med floor    Suzie Vandam Santiago Gladavid Jahlen Bollman, M.D.  [facility]  Medical Director [facility] Medical Director [facility]

## 2019-01-19 NOTE — Progress Notes (Signed)
ANTICOAGULATION CONSULT NOTE - Initial Consult  Pharmacy Consult for Heparin Indication: pulmonary embolus  No Known Allergies  Patient Measurements: Height: 5' 11.5" (181.6 cm) Weight: 255 lb (115.7 kg) IBW/kg (Calculated) : 76.45 HEPARIN DW (KG): 101.6  Vital Signs: Temp: 98.6 F (37 C) (10/27 0156) Temp Source: Oral (10/27 0156) BP: 134/86 (10/27 0430) Pulse Rate: 100 (10/27 0430)  Labs: Recent Labs    01/19/19 0434  HGB 15.4  HCT 45.2  PLT 174  CREATININE 1.00  TROPONINIHS 5   Estimated Creatinine Clearance: 130.6 mL/min (by C-G formula based on SCr of 1 mg/dL).  Medical History: Past Medical History:  Diagnosis Date  . Bipolar 1 disorder ([place])   . Diabetes mellitus without complication ([place])    L8L 10.1 01/2017   . Herpes   . Hyperglycemia   . Patient denies medical problems    Medications:  (Not in a hospital admission)  Assessment: Pharmacy asked to initiate and monitor Heparin infusion for PE.  MD asked pharmacy to initiate Coumadin protocol as concerned for pt failing Eliquis.  Pt reportedly on apixaban PTA but has been out of medication for 2 weeks.  Baseline labs ordered.   Goal of Therapy:  Heparin level 0.3-0.7 units/ml Monitor platelets by anticoagulation protocol: Yes   Plan:  Heparin 5000 units IV bolus now x 1  Heparin infusion at 1600 units/hr Check HL 6 hours after starting heparin infusion  [place] Crane, Dustin Bumbaugh A 01/19/2019,6:17 AM

## 2019-01-19 NOTE — ED Notes (Signed)
This RN called to bedside at this time. Pt states he needs to leave to get his belongings from the hotel he is staying at or they will throw his stuff out. This RN explained to patient that he is very sick and that patient could potentially die if he were to leave AMA. Pt agreeable to stay, given phone and phone number to hotel to call and notify them of his current predicament.

## 2019-01-19 NOTE — ED Notes (Signed)
Patient transported to CT 

## 2019-01-19 NOTE — ED Notes (Signed)
Pt also c/o chest pain, lower, substernal x several days. Pt describes as irritating at this time, denies SOB.

## 2019-01-19 NOTE — Progress Notes (Signed)
*  PRELIMINARY RESULTS* Echocardiogram 2D Echocardiogram has been performed.  Albert Miranda 01/19/2019, 1:58 PM

## 2019-01-19 NOTE — ED Notes (Signed)
Pt given meal tray at this time 

## 2019-01-19 NOTE — ED Notes (Signed)
IV started by this RN. Pt provided with 2 warm blankets. Lights dimmed for patient comfort. Bloodwork sent to lab by this RN. MD made aware initial RA sats 87% and patient placed on 2L. Pt resting in bed with NAD noted at this time.

## 2019-01-19 NOTE — ED Notes (Signed)
Ultrasound tech came to wr for pt and he was gone.  Checked outside and bathroom.

## 2019-01-19 NOTE — Consult Note (Signed)
[facility] Vascular Consult Note  MRN : [id number]  Albert Miranda is a 38 y.o. (11-10-1980) male who presents with chief complaint of  Chief Complaint  Patient presents with  . Leg Swelling   History of Present Illness:  The patient is a 38 year old male with a past medical history as listed below recent diagnosis of left lower extremity DVT placed on Eliquis (12/04/18) who presented to the [facility] emergency department yesterday evening complaining of worsening left lower extremity pain and swelling.  The patient endorses a history of stopping his Eliquis 2 weeks ago as a "ran out of medication".  Patient reports progressively worsening pain and swelling to left lower extremity over the last few days which prompted him to seek medical attention.  Patient was also experiencing "chest pain" sharp and nonradiating for several days.  He denies any shortness of breath.  Does endorse productive cough with clear sputum and occasional wheezing.  Patient denies any recent trauma, surgery, prolonged illness/immobility.  Of note: The patient is not taking any of his prescribed medications at this time.  CTA Chest (01/19/19):  1. Extensive, sub massive pulmonary emboli mainly involving the right main pulmonary artery and its branches. Evidence of right heart strain with flattening of the left ventricle and RV LV ratio of 1.07. 2. Normal thoracic aorta. 3. No significant pulmonary findings.  Bilateral Venous Duplex (01/19/19): 1. Progression of DVT in the left leg now extending from the calf to the upper femoral vein. Thrombus is occlusive at the level of the femoral and popliteal veins. 2. Sonographic survey of the right lower extremity negative for DVT.  Vascular surgery was consulted by Dr. Sidney Ace for possible thrombectomy.   Current Facility-Administered Medications  Medication Dose Route Frequency Provider Last Rate Last Dose  . 0.9 %  sodium  chloride infusion   Intravenous Continuous Mansy, Jan A, MD 100 mL/hr at 01/19/19 1041    . acetaminophen (TYLENOL) tablet 650 mg  650 mg Oral Q6H PRN Mansy, Jan A, MD       Or  . acetaminophen (TYLENOL) suppository 650 mg  650 mg Rectal Q6H PRN Mansy, Jan A, MD      . ARIPiprazole (ABILIFY) tablet 30 mg  30 mg Oral Daily Mansy, Jan A, MD   30 mg at 01/19/19 1220  . clonazePAM (KLONOPIN) tablet 0.5 mg  0.5 mg Oral BID Mansy, Jan A, MD   0.5 mg at 01/19/19 1221  . heparin ADULT infusion 100 units/mL (25000 units/28mL sodium chloride 0.45%)  1,600 Units/hr Intravenous Continuous Hart Robinsons A, RPH 16 mL/hr at 01/19/19 1041 1,600 Units/hr at 01/19/19 1041  . [START ON 01/20/2019] imiquimod (ALDARA) 5 % cream   Topical 3 times weekly Mansy, Jan A, MD      . insulin aspart (novoLOG) injection 0-15 Units  0-15 Units Subcutaneous TID WC Mansy, Arvella Merles, MD   5 Units at 01/19/19 1221  . lamoTRIgine (LAMICTAL) tablet 200 mg  200 mg Oral BID Mansy, Jan A, MD   200 mg at 01/19/19 1221  . linagliptin (TRADJENTA) tablet 5 mg  5 mg Oral Daily Mansy, Jan A, MD   5 mg at 01/19/19 1220  . magnesium hydroxide (MILK OF MAGNESIA) suspension 30 mL  30 mL Oral Daily PRN Mansy, Jan A, MD      . ondansetron [facility]) tablet 4 mg  4 mg Oral Q6H PRN Mansy, Arvella Merles, MD       Or  .  ondansetron (ZOFRAN) injection 4 mg  4 mg Intravenous Q6H PRN Mansy, Jan A, MD      . traZODone (DESYREL) tablet 25 mg  25 mg Oral QHS PRN Mansy, Vernetta Honey, MD       Current Outpatient Medications  Medication Sig Dispense Refill  . apixaban (ELIQUIS) 5 MG TABS tablet Take 1 tablet (5 mg total) by mouth 2 (two) times daily. Take 2 tablets 2x daily for seven days, then 1 2x daily 60 tablet 0  . ARIPiprazole (ABILIFY) 30 MG tablet TAKE 1 TABLET BY MOUTH EVERYDAY AT BEDTIME  1  . clonazePAM (KLONOPIN) 0.5 MG tablet Take 0.5 mg by mouth 2 (two) times daily.  1  . FLUoxetine (PROZAC) 10 MG capsule Take 10 mg by mouth daily.    . imiquimod (ALDARA) 5 % cream  Apply topically 3 (three) times a week. (Patient not taking: Reported on 01/19/2019) 12 each 0  . lamoTRIgine (LAMICTAL) 200 MG tablet Take 200 mg 2 (two) times daily by mouth.     . metFORMIN (GLUCOPHAGE) 500 MG tablet Take 1 tablet (500 mg total) by mouth 2 (two) times daily with a meal. (Patient not taking: Reported on 01/19/2019) 180 tablet 3  . sitaGLIPtin (JANUVIA) 100 MG tablet Take 1 tablet (100 mg total) by mouth daily. (Patient not taking: Reported on 01/19/2019) 90 tablet 1  . valACYclovir (VALTREX) 500 MG tablet Take 1 tablet (500 mg total) by mouth daily. If you see a blister/outbreak take 1 pill 2x per day x 3 days (Patient not taking: Reported on 01/19/2019) 90 tablet 3   Past Medical History:  Diagnosis Date  . Bipolar 1 disorder (HCC)   . Diabetes mellitus without complication (HCC)    A1C 10.1 01/2017   . Herpes   . Hyperglycemia   . Patient denies medical problems    Past Surgical History:  Procedure Laterality Date  . INCISION AND DRAINAGE ABSCESS     buttock gluteal region   . LEG SURGERY     Social History Social History   Tobacco Use  . Smoking status: Current Every Day Smoker    Packs/day: 1.00    Types: Cigarettes  . Smokeless tobacco: Never Used  . Tobacco comment: 1-1.5 ppd since since age 65 as of 02/07/17 does not want to quit  Substance Use Topics  . Alcohol use: Yes    Comment: occassional  . Drug use: Yes    Types: Marijuana    Comment: 1-2 x per month    Family History Family History  Problem Relation Age of Onset  . Diabetes Father        dx age 80   . Depression Mother   Denies family history of peripheral artery disease, venous disease and bleeding/clotting disorder.  No Known Allergies  REVIEW OF SYSTEMS (Negative unless checked)  Constitutional: Weight loss  Fever  Chills Cardiac: Chest pain   Chest pressure   Palpitations   Shortness of breath when laying flat   Shortness of breath at rest   Shortness of  breath with exertion. Vascular:  Pain in legs with walking   Pain in legs at rest   Pain in legs when laying flat   Claudication   Pain in feet when walking  Pain in feet at rest  Pain in feet when laying flat   History of DVT   Phlebitis   Swelling in legs   Varicose veins   Non-healing ulcers Pulmonary:   Uses home oxygen     Productive cough   Hemoptysis   Wheeze  COPD   Asthma Neurologic:  Dizziness  Blackouts   Seizures   History of stroke   History of TIA  Aphasia   Temporary blindness   Dysphagia   Weakness or numbness in arms   Weakness or numbness in legs Musculoskeletal:  Arthritis   Joint swelling   Joint pain   Low back pain Hematologic:  Easy bruising  Easy bleeding   Hypercoagulable state   Anemic  Hepatitis Gastrointestinal:  Blood in stool   Vomiting blood  Gastroesophageal reflux/heartburn   Difficulty swallowing. Genitourinary:  Chronic kidney disease   Difficult urination  Frequent urination  Burning with urination   Blood in urine Skin:  Rashes   Ulcers   Wounds Psychological:  History of anxiety    History of major depression.  Physical Examination  Vitals:   01/19/19 0830 01/19/19 0900 01/19/19 0930 01/19/19 1030  BP: 117/89 119/85 128/90 (!) 131/92  Pulse: 94 93 93 95  Resp: (!) 35 (!) 25 19 (!) 25  Temp:      TempSrc:      SpO2: 96% 97% 96% 97%  Weight:      Height:       Body mass index is 35.07 kg/m. Gen:  WD/WN, NAD, on 2L [place]: [place]/AT, No temporalis wasting. Prominent temp pulse not noted. Ear/Nose/Throat: Hearing grossly intact, nares w/o erythema or drainage, oropharynx w/o Erythema/Exudate Eyes: Sclera non-icteric, conjunctiva clear Neck: Trachea midline.  No JVD.  Pulmonary:  Good air movement, respirations not labored, equal bilaterally.  Cardiac: RRR, normal S1, S2. Vascular:  Vessel Right Left  Radial Palpable Palpable  Ulnar Palpable  Palpable  Brachial Palpable Palpable  Carotid Palpable, without bruit Palpable, without bruit  Aorta Not palpable N/A  Femoral Palpable Palpable  Popliteal Palpable Palpable  PT Palpable Palpable  DP Palpable Palpable   Left Lower Extremity: Thigh soft.  Calf soft.  Extremities warm distally to toes.  Minimally tender upon palpation.  Good capillary refill.  Motor/sensory is intact.  No discoloration noted.  There is no acute vascular compromise to the extremity at this time.  Gastrointestinal: soft, non-tender/non-distended. No guarding/reflex.  Musculoskeletal: M/S 5/5 throughout.  Extremities without ischemic changes.  No deformity or atrophy.  Neurologic: Sensation grossly intact in extremities.  Symmetrical.  Speech is fluent. Motor exam as listed above. Psychiatric: Judgment intact, Mood & affect appropriate for pt's clinical situation. Dermatologic: No rashes or ulcers noted.  No cellulitis or open wounds. Lymph : No Cervical, Axillary, or Inguinal lymphadenopathy.  CBC Lab Results  Component Value Date   WBC 11.9 (H) 01/19/2019   HGB 15.4 01/19/2019   HCT 45.2 01/19/2019   MCV 86.3 01/19/2019   PLT 174 01/19/2019   BMET    Component Value Date/Time   NA 133 (L) 01/19/2019 0434   NA 137 01/15/2013 1156   K 4.1 01/19/2019 0434   K 3.8 01/15/2013 1156   CL 96 (L) 01/19/2019 0434   CL 105 01/15/2013 1156   CO2 28 01/19/2019 0434   CO2 27 01/15/2013 1156   GLUCOSE 371 (H) 01/19/2019 0434   GLUCOSE 120 (H) 01/15/2013 1156   BUN 11 01/19/2019 0434   BUN 17 01/15/2013 1156   CREATININE 1.00 01/19/2019 0434   CREATININE 1.22 01/15/2013 1156   CALCIUM 9.0 01/19/2019 0434   CALCIUM 9.1 01/15/2013 1156   GFRNONAA >60 01/19/2019 0434   GFRNONAA >60 01/15/2013 1156   GFRAA >60 01/19/2019 0434  GFRAA >60 01/15/2013 1156   Estimated Creatinine Clearance: 130.6 mL/min (by C-G formula based on SCr of 1 mg/dL).  COAG Lab Results  Component Value Date   INR 1.0 01/19/2019    Radiology Ct Angio Chest Pe W/cm &/or Wo Cm  Result Date: 01/19/2019 CLINICAL DATA:  Chest pain, shortness of breath and DVT. EXAM: CT ANGIOGRAPHY CHEST WITH CONTRAST TECHNIQUE: Multidetector CT imaging of the chest was performed using the standard protocol during bolus administration of intravenous contrast. Multiplanar CT image reconstructions and MIPs were obtained to evaluate the vascular anatomy. CONTRAST:  57mL OMNIPAQUE IOHEXOL 350 MG/ML SOLN COMPARISON:  None. FINDINGS: Cardiovascular: The heart is normal in size. No pericardial effusion. Changes of right heart strain are noted with flattening of the left ventricle. The RV LV ratio is 1.07. The aorta is normal in caliber. No atherosclerotic calcifications. No dissection. The branch vessels are patent. No coronary artery calcifications. The pulmonary arterial tree is fairly well opacified. There is extensive right-sided pulmonary emboli with large saddle embolus in the right main pulmonary artery and extending into the right upper lobe, right middle lobe and right lower lobe pulmonary arteries. Scattered lower lobe pulmonary emboli noted on the left side also. Mediastinum/Nodes: No mediastinal or hilar mass or adenopathy. The esophagus is grossly normal. Lungs/Pleura: Bibasilar subpleural atelectasis but no infiltrates or effusions. No worrisome pulmonary lesions. Upper Abdomen: No significant upper abdominal findings. Musculoskeletal: No chest wall mass, supraclavicular or axillary adenopathy. The thyroid gland appears normal. The bony structures are unremarkable. Review of the MIP images confirms the above findings. IMPRESSION: 1. Extensive, sub massive pulmonary emboli mainly involving the right main pulmonary artery and its branches. Evidence of right heart strain with flattening of the left ventricle and RV LV ratio of 1.07. 2. Normal thoracic aorta. 3. No significant pulmonary findings. These results were called by telephone at the time of  interpretation on 01/19/2019 at 5:40 am to provider [facility] , who verbally acknowledged these results. Electronically Signed   By: Rudie Meyer M.D.   On: 01/19/2019 05:39   US Venous Img Lower Bilateral  Result Date: 01/19/2019 CLINICAL DATA:  37 year old male with PE and prior DVT EXAM: BILATERAL LOWER EXTREMITY VENOUS DOPPLER ULTRASOUND TECHNIQUE: Gray-scale sonography with graded compression, as well as color Doppler and duplex ultrasound were performed to evaluate the lower extremity deep venous systems from the level of the common femoral vein and including the common femoral, femoral, profunda femoral, popliteal and calf veins including the posterior tibial, peroneal and gastrocnemius veins when visible. The superficial great saphenous vein was also interrogated. Spectral Doppler was utilized to evaluate flow at rest and with distal augmentation maneuvers in the common femoral, femoral and popliteal veins. COMPARISON:  01/19/2019, 12/04/2018 FINDINGS: RIGHT LOWER EXTREMITY Common Femoral Vein: No evidence of thrombus. Normal compressibility, respiratory phasicity and response to augmentation. Saphenofemoral Junction: No evidence of thrombus. Normal compressibility and flow on color Doppler imaging. Profunda Femoral Vein: No evidence of thrombus. Normal compressibility and flow on color Doppler imaging. Femoral Vein: No evidence of thrombus. Normal compressibility, respiratory phasicity and response to augmentation. Popliteal Vein: No evidence of thrombus. Normal compressibility, respiratory phasicity and response to augmentation. Calf Veins: No evidence of thrombus. Normal compressibility and flow on color Doppler imaging. Superficial Great Saphenous Vein: No evidence of thrombus. Normal compressibility and flow on color Doppler imaging. Other Findings:  None. LEFT LOWER EXTREMITY Common Femoral Vein: No evidence of thrombus. Normal compressibility, respiratory phasicity and response to  augmentation. Saphenofemoral  Junction: No evidence of thrombus. Normal compressibility and flow on color Doppler imaging. Profunda Femoral Vein: No evidence of thrombus. Normal compressibility and flow on color Doppler imaging. Occlusive DVT of the femoral vein below the profunda confluence. Occlusive DVT extends through the femoral vein and popliteal vein into the posterior tibial veins. Superficial Great Saphenous Vein: No evidence of thrombus. Normal compressibility and flow on color Doppler imaging. Other Findings:  None. IMPRESSION: Sonographic survey of the left lower extremity positive for DVT/proximal DVT involving the femoral vein, popliteal vein and extending into the tibial veins. Appearance similar to the recent study. Sonographic survey of the right lower extremity negative for DVT. Electronically Signed   By: Gilmer Mor D.O.   On: 01/19/2019 11:06   US Venous Img Lower Unilateral Left  Result Date: 01/19/2019 CLINICAL DATA:  Left calf pain tonight. Patient ran out of Eliquis 2 weeks ago EXAM: LEFT LOWER EXTREMITY VENOUS DOPPLER ULTRASOUND TECHNIQUE: Gray-scale sonography with graded compression, as well as color Doppler and duplex ultrasound were performed to evaluate the lower extremity deep venous systems from the level of the common femoral vein and including the common femoral, femoral, profunda femoral, popliteal and calf veins including the posterior tibial, peroneal and gastrocnemius veins when visible. The superficial great saphenous vein was also interrogated. Spectral Doppler was utilized to evaluate flow at rest and with distal augmentation maneuvers in the common femoral, femoral and popliteal veins. COMPARISON:  12/04/2018 FINDINGS: Contralateral Common Femoral Vein: Respiratory phasicity is normal and symmetric with the symptomatic side. No evidence of thrombus. Normal compressibility. Common Femoral Vein: No evidence of thrombus. Profunda Femoral Vein: No evidence of thrombus.  Femoral Vein: Newly seen isoechoic, occlusive thrombus throughout most of the femoral vein. Popliteal Vein: Occlusive thrombus throughout. Calf Veins: Patchy nonocclusive thrombus. IMPRESSION: Progression of DVT in the left leg now extending from the calf to the upper femoral vein. Thrombus is occlusive at the level of the femoral and popliteal veins. Electronically Signed   By: Marnee Spring M.D.   On: 01/19/2019 04:39   Assessment/Plan The patient is a 38 year old male with a past medical history as listed below recent diagnosis of left lower extremity DVT placed on Eliquis (12/04/18) who presented to the [facility] emergency department yesterday evening complaining of worsening left lower extremity pain and swelling. 1.  Pulmonary embolism: Patient diagnosed with a left lower extremity DVT approximately 1.5 months ago.  The patient was placed on Eliquis which he took for approximately 1 month however has been off of it for 2 weeks due to not being able to refill the medication.  Patient presented to the [facility] emergency department yesterday evening complaining of worsening left lower extremity swelling and pain as well as chest pain with intermittent shortness of breath.  The patient was found to have "extensive, sub massive pulmonary emboli mainly involving the right main pulmonary artery and its branches. Evidence of right heart strain with flattening of the left ventricle and RV LV ratio of 1.07".  Heparin has been initiated.  Patient notes some relief in chest pain and left lower extremity discomfort with initiation of heparin.  Due to the size of the patient's pulmonary embolus, need for supplemental oxygen and noncompliance with medication would recommend a pulmonary thrombectomy.  Procedure, risks and benefit explained to the patient.  All questions answered.  Patient wishes to proceed.  2. DVT: Patient diagnosed with a left lower extremity DVT  approximately 1.5 months ago.  Was  on Eliquis for approximately 1 month however was unable to refill his medication, has been off Eliquis for 2 weeks and presented [facility] emergency department yesterday evening complaining of progressively worsening left lower extremity pain and swelling.  Repeat venous duplex with propagation of DVT proximally.  Patient now with extensive left lower extremity DVT.  Recommend a left lower extremity venous lysis and attempt to reduce clot burden and improve symptoms. Procedure, risks and benefits explained to the patient.  All questions answered.  Patient was to proceed.  3.  Noncompliance: Patient diagnosed with a DVT approximately 1.5 months ago.  The patient was started on Eliquis and encouraged to follow-up primary care physician or vascular surgeon.  Unfortunately, the patient did not do this.  The patient also ran out of medication approximately 2 weeks ago and was unable to obtain a refill and return to the emergency department with worsening left lower extremity DVT and now submassive PE.  Patient threatened to leave the emergency department AMA wanting to return to his hotel room to "get his things".  I had a very long conversation with the patient in regard to the pathophysiology of DVT and PEs.  We discussed how stopping his blood thinner may have led to the worsening of his DVT and now submassive PE.  We also discussed that if he leaves emergency department and opts not to undergo treatment or continue with a blood thinner he has an extremely high risk for respiratory failure, cardiac arrest and death.  He expresses his understanding and now has agreed for admission and wishes to move forward with pulmonary lysis, left lower extremity venous lysis and IVC filter placement.  Due to the patient's noncompliance and medication/follow-up we will also place an IVC filter.  Procedure, risks and benefits explained to the patient.  All questions  answered.  The patient wishes to proceed.  Possibly consult social work/case management for assistance with medication management?  Patient highly unlikely to be compliant on Coumadin.   4. Tobacco Abuse: We had a discussion for approximately five minutes regarding the absolute need for smoking cessation due to the deleterious nature of tobacco on the vascular system. We discussed the tobacco use would diminish patency of any intervention, and likely significantly worsen progressio of disease. We discussed multiple agents for quitting including replacement therapy or medications to reduce cravings such as Chantix. The patient voices their understanding of the importance of smoking cessation.  5.  Uncontrolled diabetic: Patient is currently not taking his diabetic medications. Encouraged good control as its slows the progression of atherosclerotic disease  Discussed with Dr. Weldon Inchesew  KIMBERLY A STEGMAYER, PA-C  01/19/2019 12:29 PM  This note was created with Dragon medical transcription system.  Any error is purely unintentional

## 2019-01-19 NOTE — ED Notes (Signed)
This RN back to bedside, pt states he was able to speak with hotel and they would be able to secure his belongings, pt is agreeable to stay. Dr. Margaretmary Eddy, hospitalist, is aware of patient wanting to leave AMA however now being agreeable to stay.

## 2019-01-19 NOTE — ED Notes (Signed)
US at bedside at this time 

## 2019-01-19 NOTE — H&P (Addendum)
[facility] [place] [facility]   PATIENT NAME: Albert Miranda    MR#:  [id number]  DATE OF BIRTH:  1980/09/17  DATE OF ADMISSION:  01/19/2019  PRIMARY CARE PHYSICIAN: System, Pcp Not In   REQUESTING/REFERRING PHYSICIAN: Chesley Noon, MD CHIEF COMPLAINT:   Chief Complaint  Patient presents with   Leg Swelling    HISTORY OF PRESENT ILLNESS:  Albert Miranda  is a 38 y.o. African-American male with a known history of Diabetes mellitus and bipolar disorder who has not been on any medications for a while, and had a recent left lower extremity DVT for which she was placed on Eliquis which she took for about a month but stopped it 3 weeks ago.  He started experiencing dyspnea since yesterday with associated cough productive of clear sputum as well as occasional wheezing.  He also complains of left calf pain.  He denies any fever or chills.  He had mild chest pain only with cough.  No bleeding diathesis.  No wheezing or hemoptysis.  No recent sick exposures to COVID-19 .  When he came to the ER, vital signs were normal except for mild tachypnea and tachycardia.  Labs revealed a BNP of 18, troponin IX 5 and blood glucose of 371.  COVID-19 test is currently pending.  Chest CTA revealed extensive submassive pulmonary bullae involving the right main pulmonary artery and its branches with evidence of right heart strain with flattening of the left ventricle and right ventricle LV ratio of 1.07.  The patient was given IV heparin bolus followed by drip.  He will be admitted to a stepdown bed for further evaluation and management.  PAST MEDICAL HISTORY:   Past Medical History:  Diagnosis Date   Bipolar 1 disorder (HCC)    Diabetes mellitus without complication (HCC)    A1C 10.1 01/2017    Herpes    Hyperglycemia    Patient denies medical problems   Left lower extremity DVT, ran out of Eliquis  PAST SURGICAL HISTORY:   Past Surgical History:  Procedure Laterality  Date   INCISION AND DRAINAGE ABSCESS     buttock gluteal region    LEG SURGERY      SOCIAL HISTORY:   Social History   Tobacco Use   Smoking status: Current Every Day Smoker    Packs/day: 1.00    Types: Cigarettes   Smokeless tobacco: Never Used   Tobacco comment: 1-1.5 ppd since since age 47 as of 02/07/17 does not want to quit  Substance Use Topics   Alcohol use: Yes    Comment: occassional    FAMILY HISTORY:   Family History  Problem Relation Age of Onset   Diabetes Father        dx age 71    Depression Mother     DRUG ALLERGIES:  No Known Allergies  REVIEW OF SYSTEMS:   ROS As per history of present illness. All pertinent systems were reviewed above. Constitutional,  HEENT, cardiovascular, respiratory, GI, GU, musculoskeletal, neuro, psychiatric, endocrine,  integumentary and hematologic systems were reviewed and are otherwise  negative/unremarkable except for positive findings mentioned above in the HPI.   MEDICATIONS AT HOME:   Prior to Admission medications   Medication Sig Start Date End Date Taking? Authorizing Provider  apixaban (ELIQUIS) 5 MG TABS tablet Take 1 tablet (5 mg total) by mouth 2 (two) times daily. Take 2 tablets 2x daily for seven days, then 1 2x daily 12/04/18  Yes Willy Eddy, MD  ARIPiprazole (ABILIFY) 30 MG tablet TAKE 1 TABLET BY MOUTH EVERYDAY AT BEDTIME 01/03/17   [provider]  clonazePAM (KLONOPIN) 0.5 MG tablet Take 0.5 mg by mouth 2 (two) times daily. 09/17/16   [provider]  FLUoxetine (PROZAC) 10 MG capsule Take 10 mg by mouth daily. 01/07/19   [provider]  imiquimod (ALDARA) 5 % cream Apply topically 3 (three) times a week. 12/11/18   Tracey HarriesGuse, Lauren M, FNP  lamoTRIgine (LAMICTAL) 200 MG tablet Take 200 mg 2 (two) times daily by mouth.  02/04/17   [provider]  metFORMIN (GLUCOPHAGE) 500 MG tablet Take 1 tablet (500 mg total) by mouth 2 (two) times daily with a meal.  02/20/17   McLean-Scocuzza, Pasty Spillersracy N, MD  sitaGLIPtin (JANUVIA) 100 MG tablet Take 1 tablet (100 mg total) by mouth daily. 02/20/17   McLean-Scocuzza, Pasty Spillersracy N, MD  valACYclovir (VALTREX) 500 MG tablet Take 1 tablet (500 mg total) by mouth daily. If you see a blister/outbreak take 1 pill 2x per day x 3 days 02/20/17   McLean-Scocuzza, Pasty Spillersracy N, MD      VITAL SIGNS:  Blood pressure 134/86, pulse 100, temperature 98.6 F (37 C), temperature source Oral, resp. rate (!) 23, SpO2 95 %.  PHYSICAL EXAMINATION:  Physical Exam  GENERAL:  38 y.o.-year-old African-American patient lying in the bed with mild respiratory distress with conversational dyspnea. EYES: Pupils equal, round, reactive to light and accommodation. No scleral icterus. Extraocular muscles intact.  HEENT: Head atraumatic, normocephalic. Oropharynx and nasopharynx clear.  NECK:  Supple, no jugular venous distention. No thyroid enlargement, no tenderness.  LUNGS: Normal breath sounds bilaterally, no wheezing, rales,rhonchi or crepitation. No use of accessory muscles of respiration.  CARDIOVASCULAR: Regular rate and rhythm, S1, S2 normal. No murmurs, rubs, or gallops.  ABDOMEN: Soft, nondistended, nontender. Bowel sounds present. No organomegaly or mass.  EXTREMITIES: No pedal edema, cyanosis, or clubbing.  Left calf pain with weakly positive Homans' sign. NEUROLOGIC: Cranial nerves II through XII are intact. Muscle strength 5/5 in all extremities. Sensation intact. Gait not checked.  PSYCHIATRIC: The patient is alert and oriented x 3.  Normal affect and good eye contact. SKIN: No obvious rash, lesion, or ulcer.   LABORATORY PANEL:   CBC Recent Labs  Lab 01/19/19 0434  WBC 11.9*  HGB 15.4  HCT 45.2  PLT 174   ------------------------------------------------------------------------------------------------------------------  Chemistries  Recent Labs  Lab 01/19/19 0434  NA 133*  K 4.1  CL 96*  CO2 28  GLUCOSE 371*  BUN  11  CREATININE 1.00  CALCIUM 9.0   ------------------------------------------------------------------------------------------------------------------  Cardiac Enzymes No results for input(s): TROPONINI in the last 168 hours. ------------------------------------------------------------------------------------------------------------------  RADIOLOGY:  Ct Angio Chest Pe W/cm &/or Wo Cm  Result Date: 01/19/2019 CLINICAL DATA:  Chest pain, shortness of breath and DVT. EXAM: CT ANGIOGRAPHY CHEST WITH CONTRAST TECHNIQUE: Multidetector CT imaging of the chest was performed using the standard protocol during bolus administration of intravenous contrast. Multiplanar CT image reconstructions and MIPs were obtained to evaluate the vascular anatomy. CONTRAST:  75mL OMNIPAQUE IOHEXOL 350 MG/ML SOLN COMPARISON:  None. FINDINGS: Cardiovascular: The heart is normal in size. No pericardial effusion. Changes of right heart strain are noted with flattening of the left ventricle. The RV LV ratio is 1.07. The aorta is normal in caliber. No atherosclerotic calcifications. No dissection. The branch vessels are patent. No coronary artery calcifications. The pulmonary arterial tree is fairly well opacified. There is extensive right-sided pulmonary emboli with large saddle  embolus in the right main pulmonary artery and extending into the right upper lobe, right middle lobe and right lower lobe pulmonary arteries. Scattered lower lobe pulmonary emboli noted on the left side also. Mediastinum/Nodes: No mediastinal or hilar mass or adenopathy. The esophagus is grossly normal. Lungs/Pleura: Bibasilar subpleural atelectasis but no infiltrates or effusions. No worrisome pulmonary lesions. Upper Abdomen: No significant upper abdominal findings. Musculoskeletal: No chest wall mass, supraclavicular or axillary adenopathy. The thyroid gland appears normal. The bony structures are unremarkable. Review of the MIP images confirms the above  findings. IMPRESSION: 1. Extensive, sub massive pulmonary emboli mainly involving the right main pulmonary artery and its branches. Evidence of right heart strain with flattening of the left ventricle and RV LV ratio of 1.07. 2. Normal thoracic aorta. 3. No significant pulmonary findings. These results were called by telephone at the time of interpretation on 01/19/2019 at 5:40 am to provider [facility] , who verbally acknowledged these results. Electronically Signed   By: Rudie Meyer M.D.   On: 01/19/2019 05:39   US Venous Img Lower Unilateral Left  Result Date: 01/19/2019 CLINICAL DATA:  Left calf pain tonight. Patient ran out of Eliquis 2 weeks ago EXAM: LEFT LOWER EXTREMITY VENOUS DOPPLER ULTRASOUND TECHNIQUE: Gray-scale sonography with graded compression, as well as color Doppler and duplex ultrasound were performed to evaluate the lower extremity deep venous systems from the level of the common femoral vein and including the common femoral, femoral, profunda femoral, popliteal and calf veins including the posterior tibial, peroneal and gastrocnemius veins when visible. The superficial great saphenous vein was also interrogated. Spectral Doppler was utilized to evaluate flow at rest and with distal augmentation maneuvers in the common femoral, femoral and popliteal veins. COMPARISON:  12/04/2018 FINDINGS: Contralateral Common Femoral Vein: Respiratory phasicity is normal and symmetric with the symptomatic side. No evidence of thrombus. Normal compressibility. Common Femoral Vein: No evidence of thrombus. Profunda Femoral Vein: No evidence of thrombus. Femoral Vein: Newly seen isoechoic, occlusive thrombus throughout most of the femoral vein. Popliteal Vein: Occlusive thrombus throughout. Calf Veins: Patchy nonocclusive thrombus. IMPRESSION: Progression of DVT in the left leg now extending from the calf to the upper femoral vein. Thrombus is occlusive at the level of the femoral and popliteal veins.  Electronically Signed   By: Marnee Spring M.D.   On: 01/19/2019 04:39      IMPRESSION AND PLAN:   1.  Acute massive PE with subsequent right ventricular strain.  The patient will be admitted to a stepdown unit.  We will continue him on IV heparin as well as start p.o. Coumadin while following daily INR.  Will obtain a 2D echo.  Will follow BNP and troponin I for prognostic values.  Obtain bilateral lower extremity venous duplex patient given his left calf pain and recent history of DVT.  I discussed the case with Dr. Belia Heman who will review his chest CT and assess the need for stepdown.  We will also obtain a vascular consult with Dr. Wyn Quaker who was notified about the patient  2.  Type 2 diabetes mellitus.  He has been off Metformin for a while.  He will be placed on supplemental coverage with subcutaneous NovoLog.  3.  History of depression anxiety and bipolar disorder.  We will restart his Abilify, Lamictal and Klonopin for now.  I am concerned if his depression actually made him neglect refilling his Eliquis.  4.  DVT prophylaxis.  He is covered with therapeutic anticoagulation.  All the records  are reviewed and case discussed with ED provider. The plan of care was discussed in details with the patient (and family). I answered all questions. The patient agreed to proceed with the above mentioned plan. Further management will depend upon hospital course.   CODE STATUS: Full code  TOTAL TIME TAKING CARE OF THIS PATIENT: 55 minutes.    Christel Mormon M.D on 01/19/2019 at 6:01 AM  Pager - [phone]  After 6pm go to [email or web address] - Proofreader  [facility]  Office  [phone]  CC: Primary care physician; System, Pcp Not In   Note: This dictation was prepared with Dragon dictation along with smaller phrase technology. Any transcriptional errors that result from this process are unintentional.

## 2019-01-20 ENCOUNTER — Encounter
Admission: EM | Disposition: A | Discharge status: Home / Self Care | Payer: Self-pay | Source: Home / Self Care | Attending: Internal Medicine

## 2019-01-20 DIAGNOSIS — I82422 Acute embolism and thrombosis of left iliac vein: Secondary | ICD-10-CM

## 2019-01-20 DIAGNOSIS — I2699 Other pulmonary embolism without acute cor pulmonale: Secondary | ICD-10-CM

## 2019-01-20 HISTORY — PX: PULMONARY THROMBECTOMY: CATH118295

## 2019-01-20 LAB — BASIC METABOLIC PANEL
Anion gap: 9 (ref 5–15)
BUN: 11 mg/dL (ref 6–20)
CO2: 27 mmol/L (ref 22–32)
Calcium: 8.7 mg/dL — ABNORMAL LOW (ref 8.9–10.3)
Chloride: 98 mmol/L (ref 98–111)
Creatinine, Ser: 1 mg/dL (ref 0.61–1.24)
GFR calc Af Amer: >60 mL/min (ref >60)
GFR calc non Af Amer: >60 mL/min (ref >60)
Glucose, Bld: 362 mg/dL — ABNORMAL HIGH (ref 70–99)
Potassium: 4 mmol/L (ref 3.5–5.1)
Sodium: 134 mmol/L — ABNORMAL LOW (ref 135–145)

## 2019-01-20 LAB — TYPE AND SCREEN
ABO/RH(D): O POS
Antibody Screen: NEGATIVE

## 2019-01-20 LAB — GLUCOSE, CAPILLARY
Glucose-Capillary: 309 mg/dL — ABNORMAL HIGH (ref 70–99)
Glucose-Capillary: 206 mg/dL — ABNORMAL HIGH (ref 70–99)
Glucose-Capillary: 181 mg/dL — ABNORMAL HIGH (ref 70–99)
Glucose-Capillary: 179 mg/dL — ABNORMAL HIGH (ref 70–99)
Glucose-Capillary: 363 mg/dL — ABNORMAL HIGH (ref 70–99)

## 2019-01-20 LAB — CBC
HCT: 42.9 % (ref 39.0–52.0)
Hemoglobin: 14.3 g/dL (ref 13.0–17.0)
MCH: 29.3 pg (ref 26.0–34.0)
MCHC: 33.3 g/dL (ref 30.0–36.0)
MCV: 87.9 fL (ref 80.0–100.0)
Platelets: 187 10*3/uL (ref 150–400)
RBC: 4.88 MIL/uL (ref 4.22–5.81)
RDW: 13.4 % (ref 11.5–15.5)
WBC: 9.9 10*3/uL (ref 4.0–10.5)
nRBC: 0 % (ref 0.0–0.2)

## 2019-01-20 LAB — MAGNESIUM: Magnesium: 2.1 mg/dL (ref 1.7–2.4)

## 2019-01-20 LAB — ECHOCARDIOGRAM COMPLETE
Height: 71.5 in
Weight: 4080 oz

## 2019-01-20 LAB — HEPARIN LEVEL (UNFRACTIONATED)
Heparin Unfractionated: 0.33 IU/mL (ref 0.30–0.70)
Heparin Unfractionated: 0.31 IU/mL (ref 0.30–0.70)

## 2019-01-20 SURGERY — PULMONARY THROMBECTOMY
Anesthesia: Moderate Sedation

## 2019-01-20 MED ORDER — CEFAZOLIN SODIUM-DEXTROSE 2-4 GM/100ML-% IV SOLN
2.0000 g | Freq: Once | INTRAVENOUS | Status: AC
  Administered 2019-01-20: 2 g via INTRAVENOUS
  Filled 2019-01-20: qty 100

## 2019-01-20 MED ORDER — HYDROMORPHONE HCL 1 MG/ML IJ SOLN: 1.0000 mg | Freq: Once | INTRAMUSCULAR | Status: DC | PRN

## 2019-01-20 MED ORDER — INSULIN ASPART 100 UNIT/ML ~~LOC~~ SOLN
0.0000 [IU] | Freq: Every day | SUBCUTANEOUS | Status: DC
  Administered 2019-01-20: 5 [IU] via SUBCUTANEOUS

## 2019-01-20 MED ORDER — FENTANYL CITRATE (PF) 100 MCG/2ML IJ SOLN
INTRAMUSCULAR | Status: DC | PRN
  Administered 2019-01-20 (×2): 50 ug via INTRAVENOUS

## 2019-01-20 MED ORDER — SODIUM CHLORIDE 0.9 % IV SOLN
INTRAVENOUS | Status: DC
  Administered 2019-01-20: 15:00:00 via INTRAVENOUS

## 2019-01-20 MED ORDER — DIPHENHYDRAMINE HCL 50 MG/ML IJ SOLN: 50.0000 mg | Freq: Once | INTRAMUSCULAR | Status: DC | PRN

## 2019-01-20 MED ORDER — INFLUENZA VAC SPLIT QUAD 0.5 ML IM SUSY
0.5000 mL | PREFILLED_SYRINGE | INTRAMUSCULAR | Status: AC
  Administered 2019-01-21: 0.5 mL via INTRAMUSCULAR
  Filled 2019-01-20: qty 0.5

## 2019-01-20 MED ORDER — HEPARIN SODIUM (PORCINE) 1000 UNIT/ML IJ SOLN
INTRAMUSCULAR | Status: AC
  Administered 2019-01-20: 19:00:00
  Filled 2019-01-20: qty 1

## 2019-01-20 MED ORDER — FENTANYL CITRATE (PF) 100 MCG/2ML IJ SOLN
INTRAMUSCULAR | Status: AC
  Administered 2019-01-20: 16:00:00
  Filled 2019-01-20: qty 2

## 2019-01-20 MED ORDER — MIDAZOLAM HCL 2 MG/2ML IJ SOLN
INTRAMUSCULAR | Status: DC | PRN
  Administered 2019-01-20 (×2): 2 mg via INTRAVENOUS

## 2019-01-20 MED ORDER — MIDAZOLAM HCL 2 MG/2ML IJ SOLN
INTRAMUSCULAR | Status: AC
  Administered 2019-01-20: 16:00:00
  Filled 2019-01-20: qty 4

## 2019-01-20 MED ORDER — METHYLPREDNISOLONE SODIUM SUCC 125 MG IJ SOLR: 125.0000 mg | Freq: Once | INTRAMUSCULAR | Status: DC | PRN

## 2019-01-20 MED ORDER — ONDANSETRON HCL 4 MG/2ML IJ SOLN: 4.0000 mg | Freq: Four times a day (QID) | INTRAMUSCULAR | Status: DC | PRN

## 2019-01-20 MED ORDER — ALTEPLASE 2 MG IJ SOLR
INTRAMUSCULAR | Status: DC | PRN
  Administered 2019-01-20: 4 mg

## 2019-01-20 MED ORDER — HEPARIN SODIUM (PORCINE) 1000 UNIT/ML IJ SOLN
INTRAMUSCULAR | Status: DC | PRN
  Administered 2019-01-20: 3000 [IU] via INTRAVENOUS

## 2019-01-20 MED ORDER — MIDAZOLAM HCL 2 MG/ML PO SYRP
8.0000 mg | ORAL_SOLUTION | Freq: Once | ORAL | Status: DC | PRN
  Filled 2019-01-20: qty 4

## 2019-01-20 MED ORDER — FAMOTIDINE 20 MG PO TABS: 40.0000 mg | ORAL_TABLET | Freq: Once | ORAL | Status: DC | PRN

## 2019-01-20 SURGICAL SUPPLY — 18 items
CANISTER PENUMBRA ENGINE (MISCELLANEOUS) ×2 IMPLANT
CANNULA 5F STIFF (CANNULA) ×2 IMPLANT
CATH ANGIO 5F 100CM .035 PIG (CATHETERS) ×2 IMPLANT
CATH INDIGO 12XTORQ 100 (CATHETERS) IMPLANT
CATH INDIGO 8 XTORQ TIP 115CM (CATHETERS) ×2 IMPLANT
CATH INDIGO SEP 8 (CATHETERS) ×2 IMPLANT
CATH INFINITI JR4 5F (CATHETERS) ×2 IMPLANT
CATH SELECT BERN TIP 5F 130 (CATHETERS) ×2 IMPLANT
GLIDEWIRE ADV .035X260CM (WIRE) ×2 IMPLANT
INTRODUCER PERFORM 12 30 .038 (SHEATH) ×2 IMPLANT
KIT FEMORAL DEL DENALI (Miscellaneous) ×2 IMPLANT
PACK ANGIOGRAPHY (CUSTOM PROCEDURE TRAY) ×2 IMPLANT
SHEATH 12FRX45 (SHEATH) ×2 IMPLANT
SHEATH BRITE TIP 8FRX11 (SHEATH) ×2 IMPLANT
SYR MEDRAD MARK 7 150ML (SYRINGE) ×2 IMPLANT
TUBING CONTRAST HIGH PRESS 72 (TUBING) ×4 IMPLANT
WIRE J 3MM .035X145CM (WIRE) ×2 IMPLANT
WIRE MAGIC TORQUE 260C (WIRE) ×2 IMPLANT

## 2019-01-20 NOTE — Op Note (Addendum)
[facility]  Percutaneous Study/Intervention Procedural Note   Date of Surgery: 01/20/2019,5:22 PM  Surgeon: Leotis Pain, MD  Pre-operative Diagnosis: Symptomatic right pulmonary emboli, extensive LLE DVT, poor compliance with anticoagulation  Post-operative diagnosis:  Same  Procedure(s) Performed:  1.  Contrast injection right heart  2.  Thrombolysis right pulmonary arteries  3.  Mechanical thrombectomy right main, upper lobe, middle lobe, and lower lobe pulmonary arteries with the penumbra cat 8 device  4.  Selective catheter placement right upper lobe, middle lobe, and lower lobe pulmonary arteries  5.  Selective catheter placement left main pulmonary artery  6.  IVC filter placement  7.  Mechanical thrombectomy to the left external iliac vein with the penumbra cat 12 device    Anesthesia: Conscious sedation was administered under my direct supervision by the interventional radiology RN. IV Versed plus fentanyl were utilized. Continuous ECG, pulse oximetry and blood pressure was monitored throughout the entire procedure.  Versed and fentanyl were administered intravenously.  Conscious sedation was administered for a total of 60 minutes using 4 mg of Versed and 100 mcg of fentanyl.  Sheath: 12 French left groin  Contrast: 105 cc   Fluoroscopy Time: 24 minutes  Indications:  Patient presents with pulmonary emboli as well as extensive left lower extremity DVT and poor compliance with Coumadin. The patient is symptomatic with hypoxemia and dyspnea on exertion.  There is evidence of right heart strain on the CT angiogram. The patient is otherwise a good candidate for intervention and even the long-term benefits pulmonary angiography with thrombolysis is offered. The risks and benefits are reviewed long-term benefits are discussed. All questions are answered patient agrees to proceed.  Also planning to place an IVC filter due to the extensive residual left lower  extremity DVT and if he has significant iliac clot or common femoral clot that we can treat percutaneously we might do that as well.  Procedure:  Tryce Surratt [facility] a 38 y.o. male who was identified and appropriate procedural time out was performed.  The patient was then placed supine on the table and prepped and draped in the usual sterile fashion.  Ultrasound was used to evaluate the left common femoral vein.  It did have thrombus present.  A digital ultrasound image was acquired for the permanent record.  A micropuncture needle was used to access the left common femoral vein under direct ultrasound guidance.  A microwire was then advanced under fluoroscopic guidance followed by micro-sheath.  A 0.035 J wire was advanced without resistance and a 5Fr sheath was placed and then upsized to an 8 [place] sheath.    The wire and pigtail catheter were then negotiated into the right atrium and bolus injection of contrast was utilized to demonstrate the right ventricle and the pulmonary artery outflow. The wire and catheter were then negotiated into the left main pulmonary artery where injection was performed in the left main pulmonary artery showed no significant pulmonary embolus on the left.  It was then negotiated over into the right with a JR4 catheter and the advantage wire where there was extensive thrombus mostly in the right upper and middle lobes with some thrombus in the right lower lobe as well  3000 units of heparin was then given and allowed to circulate.  TPA was reconstituted and delivered onto the table. A total of 4 milligrams of TPA was utilized.  No TPA was administered on the left side and 4 mg of TPA was administered on the  right side. This was then allowed to dwell for 20-30 minutes.  The Penumbra Cat 8 catheter was then advanced up into the pulmonary vasculature.  I initially plan to use the penumbra cat 12 device, but this would not advance into the pulmonary vasculature.  The right lung  was addressed. Catheter was negotiated into the right lower lobe pulmonary artery and mechanical thrombectomy was performed. Follow-up imaging demonstrated a good result and therefore the catheter was renegotiated into the right middle lobe pulmonary artery and again mechanical thrombectomy was performed after selective imaging. Passes were made with both the Penumbra catheter itself as well as introducing the separator. Follow-up imaging was then performed which showed a marked improvement.  I then turned my attention to the right upper lobe pulmonary artery.  This was a little harder to negotiate into, and I had to get a select catheter and the advantage wire to get up into the right upper lobe pulmonary artery.  Selective imaging showed extensive clot burden in the right upper lobe and multiple passes with the penumbra cat 8 device including with the separator were done in the right upper lobe pulmonary artery.  Completion imaging following this showed a small amount of residual thrombus in the right upper lobe pulmonary artery but I felt this was markedly improved and elected to complete this portion of the procedure.  I then pulled the sheath back into the inferior vena cava distally.  Selective imaging of the inferior vena cava was performed demonstrating the level of the renal veins at L1.  I then deployed a [employer] IVC filter at the L1-L2 interspace.  The delivery sheath for the filter was removed. The low [place] sheath was brought down to near the access site in the left common femoral vein.  A tail of thrombus extended well into the external iliac vein so the penumbra cat 12 device was used for mechanical thrombectomy of the left iliac vein.  A large blob of thrombus was removed with completion imaging showing no significant residual thrombus in the iliac venous system.  The sheath was then removed from the left groin.  A safeguard is placed.  The patient was taken to recovery room in stable  condition    Findings:   Right heart imaging:  Right atrium and right ventricle and the pulmonary outflow tract appears normal  Right lung: Extensive thrombus throughout the main pulmonary artery and all 3 lobar branches  Left lung: No significant pulmonary embolus seen in the left pulmonary vasculature    Disposition: Patient was taken to the recovery room in stable condition having tolerated the procedure well.  Festus Barren 01/20/2019,5:22 PM

## 2019-01-20 NOTE — Progress Notes (Signed)
ANTICOAGULATION CONSULT NOTE - Follow up [facility] for Heparin Indication: pulmonary embolus  No Known Allergies  Patient Measurements: Height: 5\' 11"  (180.3 cm) Weight: 238 lb 8.6 oz (108.2 kg) IBW/kg (Calculated) : 75.3 HEPARIN DW (KG): 98.3  Vital Signs: Temp: 99 F (37.2 C) (10/28 1518) Temp Source: Oral (10/28 1518) BP: 120/74 (10/28 1800) Pulse Rate: 98 (10/28 1800)  Labs: Recent Labs    01/19/19 0434 01/19/19 0624  01/19/19 1852 01/20/19 0224 01/20/19 0908  HGB 15.4  --   --   --  14.3  --   HCT 45.2  --   --   --  42.9  --   PLT 174  --   --   --  187  --   APTT 26  --   --   --   --   --   LABPROT 13.0  --   --   --   --   --   INR 1.0  --   --   --   --   --   HEPARINUNFRC  --   --    < > 0.23* 0.33 0.31  CREATININE 1.00  --   --   --  1.00  --   TROPONINIHS 5 6  --   --   --   --    < > = values in this interval not displayed.   Estimated Creatinine Clearance: 125.4 mL/min (by C-G formula based on SCr of 1 mg/dL).  Medical History: Past Medical History:  Diagnosis Date  . Bipolar 1 disorder ([place])   . Diabetes mellitus without complication ([place])    B3A 10.1 01/2017   . Herpes   . Hyperglycemia   . Patient denies medical problems    Medications:  Apixaban prior to admission. Per patient took x1 month but has been out of medication for two weeks.   Assessment: 38 y/o M with history of recently diagnosed DVT presents c/o dyspnea, chest pain, and worsening LE pain and swelling.   He was prescribed apixaban for treatment as an outpatient but has not taken the medication for approximately two weeks. Work-up significant for extensive sub-massive PE involving right main pulmonary artery and branches with evidence of right heart strain.   Plan for pulmonary thrombectomy. He is on the cath lab schedule for 10/28. Pharmacy consulted for heparin drip for PE. Do not anticipate apixaban related interference of heparin level due to last dose ~2  weeks ago.    Goal of Therapy:  Heparin level 0.3-0.7 units/ml Monitor platelets by anticoagulation protocol: Yes   Plan:  -Heparin stopped at 1603 for procedure and re-started at 1745; patient was therapeutic x 2 on current rate -Heparin level scheduled for 10/29 0000 -CBC daily per protocol -Follow surgery/anticoagulation plan  Pharmacy will continue to follow.   [place] Resident 01/20/2019 7:17 PM

## 2019-01-20 NOTE — Progress Notes (Addendum)
Inpatient Diabetes Program Recommendations  AACE/ADA: New Consensus Statement on Inpatient Glycemic Control (2015)  Target Ranges:  Prepandial:   less than 140 mg/dL      Peak postprandial:   less than 180 mg/dL (1-2 hours)      Critically ill patients:  140 - 180 mg/dL   Results for Albert Miranda, Albert Miranda (MRN [id number]) as of 01/20/2019 07:22  Ref. Range 01/19/2019 09:39 01/19/2019 11:39 01/19/2019 15:37 01/19/2019 21:30  Glucose-Capillary Latest Ref Range: 70 - 99 mg/dL 343 (H)  11 units NOVOLOG  248 (H)  5 units NOVOLOG  336 (H)  11 units NOVOLOG  318 (H)   Results for Albert Miranda (MRN [id number]) as of 01/20/2019 07:22  Ref. Range 02/07/2017 10:48 01/19/2019 12:54  Hemoglobin A1C Latest Ref Range: 4.8 - 5.6 % 10.1 (H) 13.4 (H)  (337 mg/dl)     To ED with Acute massive PE with subsequent R ventricular strain/ DVT  History: DM, Bipolar disorder (off meds for a while per pt report)   Home DM Meds: Januvia 100 mg Daily (NOT taking)       Metformin 500 mg BID (NOT taking)   Current Orders: Novolog Moderate Correction Scale/ SSI (0-15 units) TID AC     Tradjenta 5 mg Daily    PCP: Philis Nettle, NP with [facility]  NPO for Pulmonary Thrombectomy and L lower extremity venous lysis by Vascular Team today.    Note patient admitted to noncompliance with Diabetes medications on admission.  Current A1c of 13.4% reflective of very poor glucose control at home.  Not sure if patient will be adherent to taking insulin at home and not sure insulin would be the best option for him given his noncompliance with oral DM meds and the Eliquis that was was prescribed for him.  It also sounds like patient may have housing issues??  Per ED nursing notes, pt wanted to leave AMA from the ED b/c he needed to secure his belongings from the hotel he was staying at.  MD--Please consider the following:  1. For in-hospital CBG control, recommend we start Lantus 20 units Daily (0.2 units/kg  dosing)   2. For discharge, it looks like patient would benefit from insulin given his A1c is 13.4%, however, I am not sure pt would be willing to be compliant with insulin.  May want to restart oral meds at discharge.  Would continue Metformin but Stop the Januvia and instead try Amaryl for home.  Recommend Metformin 1000 mg BID + Amaryl 4 mg Daily for home use.  Both of these meds are inexpensive and can be purchased for $4 each at [facility].   Needs to follow up with his PCP soon after discharge  Addendum 12pm- Met w/ pt this AM to discuss diabetes, current A1c, meds at home, etc.  Per Record Review, pt was diagnosed with diabetes back on 02/20/2017 during MD visit.  A1c at that time, his A1c was 10.1%.  Per review of MDs notes Visit (with Dr. Olivia Mackie McLean-Scocuzza), pt was counseled that he had diabetes and was given Rxs for Januvia and Metformin.  Told his MD that he did not believe his diagnosis and wanted to know how the A1c test was processed.  When speaking with pt today, pt told me he was never told he had diabetes.  When I asked about his MD visit back in Nov 2018, pt denied being told and stated that when he went to the mental institution they gave him rxs  for diabetes meds but that he never took them.  I tried to explain about his A1c from back in 2018 and his current A1c.  Explained what an A1c is and what it measures.  Explained that his A1c back in 2018 showed a diagnosis of diabetes and that his current A1c of 13.4% shows worse CBG control at home.  Pt stated to me he thinks his "clot" is making his CBGs high and that they will be normal again after the clot goes away.  Tried to explain that the clot may have minorly affected his CBGs but that he has diabetes according to his records and current glucose and A1c levels.  Patient wanted to know what the side effects of Novolog insulin are.  Explained to pt that typically the only side effect of insulin is Hypoglycemia.  Stressed to pt  that currently his glucose levels are in the 300s and he is likely not at risk for Hypoglycemia right now.   It appears patient is in complete denial of his diabetes.  Note he has issues with Psychiatric illness and this is likely affecting his actions and judgement.   --Will follow patient during hospitalization--  Wyn Quaker RN, MSN, CDE Diabetes Coordinator Inpatient Glycemic Control Team Team Pager: [phone] (8a-5p)

## 2019-01-20 NOTE — Progress Notes (Signed)
[facility] - [place] at [facility]   PATIENT NAME: Albert Miranda    MR#:  [id number]  DATE OF BIRTH:  10-18-1980  SUBJECTIVE:  No new complaints, feels SOB REVIEW OF SYSTEMS:  CONSTITUTIONAL: No fever, fatigue or weakness.  EYES: No blurred or double vision.  EARS, NOSE, AND THROAT: No tinnitus or ear pain.  RESPIRATORY: No cough, shortness of breath, wheezing or hemoptysis.  CARDIOVASCULAR: No chest pain, orthopnea, edema.  GASTROINTESTINAL: No nausea, vomiting, diarrhea or abdominal pain.  GENITOURINARY: No dysuria, hematuria.  ENDOCRINE: No polyuria, nocturia,  HEMATOLOGY: No anemia, easy bruising or bleeding SKIN: No rash or lesion. MUSCULOSKELETAL: No joint pain or arthritis.   NEUROLOGIC: No tingling, numbness, weakness.  PSYCHIATRY: No anxiety or depression.   DRUG ALLERGIES:  No Known Allergies  VITALS:  Blood pressure 120/74, pulse 98, temperature 99 F (37.2 C), temperature source Oral, resp. rate (!) 23, height 5\' 11"  (1.803 m), weight 108.2 kg, SpO2 95 %.  PHYSICAL EXAMINATION:  GENERAL:  38 y.o.-year-old patient lying in the bed with no acute distress.  EYES: Pupils equal, round, reactive to light and accommodation. No scleral icterus. Extraocular muscles intact.  HEENT: Head atraumatic, normocephalic. Oropharynx and nasopharynx clear.  NECK:  Supple, no jugular venous distention. No thyroid enlargement, no tenderness.  LUNGS: Normal breath sounds bilaterally, no wheezing, rales,rhonchi or crepitation. No use of accessory muscles of respiration.  CARDIOVASCULAR: S1, S2 normal. No murmurs, rubs, or gallops.  ABDOMEN: Soft, nontender, nondistended. Bowel sounds present.  EXTREMITIES: No pedal edema, cyanosis, or clubbing.  NEUROLOGIC: Cranial nerves II through XII are intact. Muscle strength 5/5 in all extremities. Sensation intact. Gait not checked.  PSYCHIATRIC: The patient is alert and oriented x 3.  SKIN: No obvious rash, lesion, or  ulcer.    LABORATORY PANEL:   CBC Recent Labs  Lab 01/20/19 0224  WBC 9.9  HGB 14.3  HCT 42.9  PLT 187   ------------------------------------------------------------------------------------------------------------------  Chemistries  Recent Labs  Lab 01/20/19 0224  NA 134*  K 4.0  CL 98  CO2 27  GLUCOSE 362*  BUN 11  CREATININE 1.00  CALCIUM 8.7*  MG 2.1   ------------------------------------------------------------------------------------------------------------------  Cardiac Enzymes No results for input(s): TROPONINI in the last 168 hours. ------------------------------------------------------------------------------------------------------------------  RADIOLOGY:  Ct Angio Chest Pe W/cm &/or Wo Cm  Result Date: 01/19/2019 CLINICAL DATA:  Chest pain, shortness of breath and DVT. EXAM: CT ANGIOGRAPHY CHEST WITH CONTRAST TECHNIQUE: Multidetector CT imaging of the chest was performed using the standard protocol during bolus administration of intravenous contrast. Multiplanar CT image reconstructions and MIPs were obtained to evaluate the vascular anatomy. CONTRAST:  39mL OMNIPAQUE IOHEXOL 350 MG/ML SOLN COMPARISON:  None. FINDINGS: Cardiovascular: The heart is normal in size. No pericardial effusion. Changes of right heart strain are noted with flattening of the left ventricle. The RV LV ratio is 1.07. The aorta is normal in caliber. No atherosclerotic calcifications. No dissection. The branch vessels are patent. No coronary artery calcifications. The pulmonary arterial tree is fairly well opacified. There is extensive right-sided pulmonary emboli with large saddle embolus in the right main pulmonary artery and extending into the right upper lobe, right middle lobe and right lower lobe pulmonary arteries. Scattered lower lobe pulmonary emboli noted on the left side also. Mediastinum/Nodes: No mediastinal or hilar mass or adenopathy. The esophagus is grossly normal.  Lungs/Pleura: Bibasilar subpleural atelectasis but no infiltrates or effusions. No worrisome pulmonary lesions. Upper Abdomen: No significant upper abdominal findings. Musculoskeletal: No  chest wall mass, supraclavicular or axillary adenopathy. The thyroid gland appears normal. The bony structures are unremarkable. Review of the MIP images confirms the above findings. IMPRESSION: 1. Extensive, sub massive pulmonary emboli mainly involving the right main pulmonary artery and its branches. Evidence of right heart strain with flattening of the left ventricle and RV LV ratio of 1.07. 2. Normal thoracic aorta. 3. No significant pulmonary findings. These results were called by telephone at the time of interpretation on 01/19/2019 at 5:40 am to provider [facility] , who verbally acknowledged these results. Electronically Signed   By: Rudie MeyerP.  Gallerani M.D.   On: 01/19/2019 05:39   [place] Venous Img Lower Bilateral  Result Date: 01/19/2019 CLINICAL DATA:  38 year old male with PE and prior DVT EXAM: BILATERAL LOWER EXTREMITY VENOUS DOPPLER ULTRASOUND TECHNIQUE: Gray-scale sonography with graded compression, as well as color Doppler and duplex ultrasound were performed to evaluate the lower extremity deep venous systems from the level of the common femoral vein and including the common femoral, femoral, profunda femoral, popliteal and calf veins including the posterior tibial, peroneal and gastrocnemius veins when visible. The superficial great saphenous vein was also interrogated. Spectral Doppler was utilized to evaluate flow at rest and with distal augmentation maneuvers in the common femoral, femoral and popliteal veins. COMPARISON:  01/19/2019, 12/04/2018 FINDINGS: RIGHT LOWER EXTREMITY Common Femoral Vein: No evidence of thrombus. Normal compressibility, respiratory phasicity and response to augmentation. Saphenofemoral Junction: No evidence of thrombus. Normal compressibility and flow on color Doppler imaging.  Profunda Femoral Vein: No evidence of thrombus. Normal compressibility and flow on color Doppler imaging. Femoral Vein: No evidence of thrombus. Normal compressibility, respiratory phasicity and response to augmentation. Popliteal Vein: No evidence of thrombus. Normal compressibility, respiratory phasicity and response to augmentation. Calf Veins: No evidence of thrombus. Normal compressibility and flow on color Doppler imaging. Superficial Great Saphenous Vein: No evidence of thrombus. Normal compressibility and flow on color Doppler imaging. Other Findings:  None. LEFT LOWER EXTREMITY Common Femoral Vein: No evidence of thrombus. Normal compressibility, respiratory phasicity and response to augmentation. Saphenofemoral Junction: No evidence of thrombus. Normal compressibility and flow on color Doppler imaging. Profunda Femoral Vein: No evidence of thrombus. Normal compressibility and flow on color Doppler imaging. Occlusive DVT of the femoral vein below the profunda confluence. Occlusive DVT extends through the femoral vein and popliteal vein into the posterior tibial veins. Superficial Great Saphenous Vein: No evidence of thrombus. Normal compressibility and flow on color Doppler imaging. Other Findings:  None. IMPRESSION: Sonographic survey of the left lower extremity positive for DVT/proximal DVT involving the femoral vein, popliteal vein and extending into the tibial veins. Appearance similar to the recent study. Sonographic survey of the right lower extremity negative for DVT. Electronically Signed   By: Gilmer MorJaime  Wagner D.O.   On: 01/19/2019 11:06   [place] Venous Img Lower Unilateral Left  Result Date: 01/19/2019 CLINICAL DATA:  Left calf pain tonight. Patient ran out of Eliquis 2 weeks ago EXAM: LEFT LOWER EXTREMITY VENOUS DOPPLER ULTRASOUND TECHNIQUE: Gray-scale sonography with graded compression, as well as color Doppler and duplex ultrasound were performed to evaluate the lower extremity deep venous  systems from the level of the common femoral vein and including the common femoral, femoral, profunda femoral, popliteal and calf veins including the posterior tibial, peroneal and gastrocnemius veins when visible. The superficial great saphenous vein was also interrogated. Spectral Doppler was utilized to evaluate flow at rest and with distal augmentation maneuvers in the common femoral, femoral and popliteal  veins. COMPARISON:  12/04/2018 FINDINGS: Contralateral Common Femoral Vein: Respiratory phasicity is normal and symmetric with the symptomatic side. No evidence of thrombus. Normal compressibility. Common Femoral Vein: No evidence of thrombus. Profunda Femoral Vein: No evidence of thrombus. Femoral Vein: Newly seen isoechoic, occlusive thrombus throughout most of the femoral vein. Popliteal Vein: Occlusive thrombus throughout. Calf Veins: Patchy nonocclusive thrombus. IMPRESSION: Progression of DVT in the left leg now extending from the calf to the upper femoral vein. Thrombus is occlusive at the level of the femoral and popliteal veins. Electronically Signed   By: Monte Fantasia M.D.   On: 01/19/2019 04:39    EKG:   Orders placed or performed during the hospital encounter of 11/16/16  . EKG 12-Lead  . EKG 12-Lead  . ED EKG within 10 minutes  . ED EKG within 10 minutes  . EKG    ASSESSMENT AND PLAN:   1.  Acute massive PE with subsequent right ventricular strain.  With recent history of lower extremity DVT and noncompliant with Eliquis   on IV heparin , continue the same  - vascular planning to do pulmonary thrombectomy, venous thrombolysis of the lower extremity and IVC filter placement  today  -2D echo.   Significant left lower extremity DVT on bilateral lower extremity venous duplex Dr. Mortimer Fries was notified by the admitting physician   2.  Type 2 diabetes mellitus.  He has been off Metformin for a while.  He will be placed on supplemental coverage with subcutaneous NovoLog.  3.   History of depression anxiety and bipolar disorder.  restart his Abilify, Lamictal and Klonopin If necessary will consider psychiatry consult  as it is a concern for patient's depression actually made him neglect refilling his Eliquis.  4.  DVT prophylaxis.  He is covered with therapeutic anticoagulation     All the records are reviewed and case discussed with Care Management/Social Workerr. Management plans discussed with the patient, he is in agreement.  He does not have any family members and friends.  Currently disabled CODE STATUS: fc   TOTAL  TIME TAKING CARE OF THIS PATIENT: 35  minutes.   POSSIBLE D/C IN 1-2  DAYS, DEPENDING ON CLINICAL CONDITION.  Note: This dictation was prepared with Dragon dictation along with smaller phrase technology. Any transcriptional errors that result from this process are unintentional.   Max Sane M.D on 01/20/2019 at 6:38 PM  Between 7am to 6pm - Pager - [phone] After 6pm go to [email or web address] - password [email or web address] [place] [facility]  Office  [phone]  CC: Primary care physician; System, Pcp Not In

## 2019-01-20 NOTE — Progress Notes (Signed)
ANTICOAGULATION CONSULT NOTE - Follow up Albert Miranda for Heparin Indication: pulmonary embolus  No Known Allergies  Patient Measurements: Height: 5\' 11"  (180.3 cm) Weight: 238 lb 8.6 oz (108.2 kg) IBW/kg (Calculated) : 75.3 HEPARIN DW (KG): 98.3  Vital Signs: Temp: 98.4 F (36.9 C) (10/27 2029) Temp Source: Oral (10/27 2029) BP: 131/78 (10/27 2029) Pulse Rate: 103 (10/27 2029)  Labs: Recent Labs    01/19/19 0434 01/19/19 0624 01/19/19 1254 01/19/19 1852 01/20/19 0224  HGB 15.4  --   --   --  14.3  HCT 45.2  --   --   --  42.9  PLT 174  --   --   --  187  APTT 26  --   --   --   --   LABPROT 13.0  --   --   --   --   INR 1.0  --   --   --   --   HEPARINUNFRC  --   --  0.53 0.23* 0.33  CREATININE 1.00  --   --   --  1.00  TROPONINIHS 5 6  --   --   --    Estimated Creatinine Clearance: 125.4 mL/min (by C-G formula based on SCr of 1 mg/dL).  Medical History: Past Medical History:  Diagnosis Date  . Bipolar 1 disorder ([place])   . Diabetes mellitus without complication ([place])    W5Y 10.1 01/2017   . Herpes   . Hyperglycemia   . Patient denies medical problems    Medications:  Apixaban prior to admission. Per patient took x1 month but has been out of medication for two weeks.   Assessment: 38 y/o M with history of recently diagnosed DVT presents c/o dyspnea, chest pain, and worsening LE pain and swelling. He was prescribed apixaban for treatment as an outpatient but has not taken the medication for approximately two weeks. Work-up significant for extensive sub-massive PE involving right main pulmonary artery and branches with evidence of right heart strain. Plan for pulmonary thrombectomy. He is on the cath lab schedule for 10/28. Pharmacy consulted for heparin drip for PE. Do not anticipate apixaban related interference of heparin level due to last dose ~2 weeks ago.   Heparin started with 5000 units IV x1 bolus, drip at 1600 units/hr 10/27 @ 1254 HL  0.53, therapeutic x1.  Goal of Therapy:  Heparin level 0.3-0.7 units/ml Monitor platelets by anticoagulation protocol: Yes   Plan:  -10/27 @ 1852 HL 0.23, subtherapeutic. Per RN no line issues or s/sx of bleeding noted; confirms heparin running at 16 ml/hr. Heparin bolus 1500 units IV x1 then increase heparin drip to 1800 units/hr.  10.28 @ 0224 GK 0.33, therapeutic x 1.  Continue Heparin at current rate -Check HL in 6 hours to confirm -Daily CBC per protocol -Follow surgery/anticoagulation plan  Pharmacy will continue to follow.   Hart Robinsons, PharmD Clinical Pharmacist 01/20/2019 3:10 AM

## 2019-01-20 NOTE — H&P (Signed)
[facility] History & Physical Update  The patient was interviewed and re-examined.  The patient's previous History and Physical has been reviewed and is unchanged.  There is no change in the plan of care. We plan to proceed with the scheduled procedure.  Leotis Pain, MD  01/20/2019, 3:41 PM

## 2019-01-20 NOTE — Progress Notes (Signed)
ANTICOAGULATION CONSULT NOTE - Follow up [facility] for Heparin Indication: pulmonary embolus  No Known Allergies  Patient Measurements: Height: 5\' 11"  (180.3 cm) Weight: 238 lb 8.6 oz (108.2 kg) IBW/kg (Calculated) : 75.3 HEPARIN DW (KG): 98.3  Vital Signs: Temp: 98.4 F (36.9 C) (10/28 1220) Temp Source: Oral (10/28 1220) BP: 127/71 (10/28 1220) Pulse Rate: 94 (10/28 1220)  Labs: Recent Labs    01/19/19 0434 01/19/19 0624  01/19/19 1852 01/20/19 0224 01/20/19 0908  HGB 15.4  --   --   --  14.3  --   HCT 45.2  --   --   --  42.9  --   PLT 174  --   --   --  187  --   APTT 26  --   --   --   --   --   LABPROT 13.0  --   --   --   --   --   INR 1.0  --   --   --   --   --   HEPARINUNFRC  --   --    < > 0.23* 0.33 0.31  CREATININE 1.00  --   --   --  1.00  --   TROPONINIHS 5 6  --   --   --   --    < > = values in this interval not displayed.   Estimated Creatinine Clearance: 125.4 mL/min (by C-G formula based on SCr of 1 mg/dL).  Medical History: Past Medical History:  Diagnosis Date  . Bipolar 1 disorder ([place])   . Diabetes mellitus without complication ([place])    Q6V 10.1 01/2017   . Herpes   . Hyperglycemia   . Patient denies medical problems    Medications:  Apixaban prior to admission. Per patient took x1 month but has been out of medication for two weeks.   Assessment: 38 y/o M with history of recently diagnosed DVT presents c/o dyspnea, chest pain, and worsening LE pain and swelling.   He was prescribed apixaban for treatment as an outpatient but has not taken the medication for approximately two weeks. Work-up significant for extensive sub-massive PE involving right main pulmonary artery and branches with evidence of right heart strain.   Plan for pulmonary thrombectomy. He is on the cath lab schedule for 10/28. Pharmacy consulted for heparin drip for PE. Do not anticipate apixaban related interference of heparin level due to last dose ~2  weeks ago.   Heparin started with 5000 units IV x1 bolus, drip at 1600 units/hr 10/27 @ 1254 HL 0.53, therapeutic x1.  Goal of Therapy:  Heparin level 0.3-0.7 units/ml Monitor platelets by anticoagulation protocol: Yes   Plan:  -10/27 @ 1852 HL 0.23, subtherapeutic. Per RN no line issues or s/sx of bleeding noted; confirms heparin running at 16 ml/hr. Heparin bolus 1500 units IV x1 then increase heparin drip to 1800 units/hr.   10/28 @ 0224 HL 0.33, therapeutic x 1   10/28 @ 0908 HL 0.31, therapeutic x 2  Continue Heparin at current rate -Check HL/CBC daily per protocol -Follow surgery/anticoagulation plan  Pharmacy will continue to follow.   Lu Duffel, PharmD, BCPS Clinical Pharmacist 01/20/2019 2:05 PM

## 2019-01-21 ENCOUNTER — Encounter: Payer: Self-pay | Admitting: Vascular Surgery

## 2019-01-21 LAB — CBC
HCT: 41.1 % (ref 39.0–52.0)
Hemoglobin: 13.7 g/dL (ref 13.0–17.0)
MCH: 29.4 pg (ref 26.0–34.0)
MCHC: 33.3 g/dL (ref 30.0–36.0)
MCV: 88.2 fL (ref 80.0–100.0)
Platelets: 201 10*3/uL (ref 150–400)
RBC: 4.66 MIL/uL (ref 4.22–5.81)
RDW: 13.2 % (ref 11.5–15.5)
WBC: 8.3 10*3/uL (ref 4.0–10.5)
nRBC: 0 % (ref 0.0–0.2)

## 2019-01-21 LAB — GLUCOSE, CAPILLARY
Glucose-Capillary: 294 mg/dL — ABNORMAL HIGH (ref 70–99)
Glucose-Capillary: 311 mg/dL — ABNORMAL HIGH (ref 70–99)
Glucose-Capillary: 129 mg/dL — ABNORMAL HIGH (ref 70–99)
Glucose-Capillary: 133 mg/dL — ABNORMAL HIGH (ref 70–99)

## 2019-01-21 LAB — BASIC METABOLIC PANEL
Anion gap: 13 (ref 5–15)
BUN: 10 mg/dL (ref 6–20)
CO2: 23 mmol/L (ref 22–32)
Calcium: 8.5 mg/dL — ABNORMAL LOW (ref 8.9–10.3)
Chloride: 99 mmol/L (ref 98–111)
Creatinine, Ser: 0.99 mg/dL (ref 0.61–1.24)
GFR calc Af Amer: >60 mL/min (ref >60)
GFR calc non Af Amer: >60 mL/min (ref >60)
Glucose, Bld: 262 mg/dL — ABNORMAL HIGH (ref 70–99)
Potassium: 3.9 mmol/L (ref 3.5–5.1)
Sodium: 135 mmol/L (ref 135–145)

## 2019-01-21 LAB — HEPARIN LEVEL (UNFRACTIONATED)
Heparin Unfractionated: 0.26 IU/mL — ABNORMAL LOW (ref 0.30–0.70)
Heparin Unfractionated: 0.43 IU/mL (ref 0.30–0.70)

## 2019-01-21 MED ORDER — GUAIFENESIN-DM 100-10 MG/5ML PO SYRP: 10.0000 mL | ORAL_SOLUTION | ORAL | Status: DC | PRN

## 2019-01-21 MED ORDER — APIXABAN 5 MG PO TABS
10.0000 mg | ORAL_TABLET | Freq: Two times a day (BID) | ORAL | Status: DC
  Administered 2019-01-21 – 2019-01-22 (×3): 10 mg via ORAL
  Filled 2019-01-21 (×3): qty 2

## 2019-01-21 MED ORDER — METFORMIN HCL 500 MG PO TABS
1000.0000 mg | ORAL_TABLET | Freq: Two times a day (BID) | ORAL | Status: DC
  Administered 2019-01-21 – 2019-01-22 (×3): 1000 mg via ORAL
  Filled 2019-01-21 (×3): qty 2

## 2019-01-21 MED ORDER — PHENOL 1.4 % MT LIQD
1.0000 | OROMUCOSAL | Status: DC | PRN
  Administered 2019-01-21: 1 via OROMUCOSAL
  Filled 2019-01-21: qty 177

## 2019-01-21 MED ORDER — GLIMEPIRIDE 4 MG PO TABS
4.0000 mg | ORAL_TABLET | Freq: Every day | ORAL | Status: DC
  Administered 2019-01-21 – 2019-01-22 (×2): 4 mg via ORAL
  Filled 2019-01-21 (×2): qty 1

## 2019-01-21 MED ORDER — APIXABAN 5 MG PO TABS: 5.0000 mg | ORAL_TABLET | Freq: Two times a day (BID) | ORAL | Status: DC

## 2019-01-21 MED ORDER — SENNOSIDES-DOCUSATE SODIUM 8.6-50 MG PO TABS
2.0000 | ORAL_TABLET | Freq: Two times a day (BID) | ORAL | Status: DC
  Administered 2019-01-21 – 2019-01-22 (×3): 2 via ORAL
  Filled 2019-01-21 (×3): qty 2

## 2019-01-21 MED ORDER — HEPARIN BOLUS VIA INFUSION
1000.0000 [IU] | INTRAVENOUS | Status: AC
  Administered 2019-01-21: 1000 [IU] via INTRAVENOUS
  Filled 2019-01-21: qty 1000

## 2019-01-21 NOTE — Progress Notes (Signed)
Albert Miranda   Subjective: 1 Day Post-Op:             1.  Contrast injection right heart             2.  Thrombolysis right pulmonary arteries             3.  Mechanical thrombectomy right main, upper lobe, middle lobe, and lower lobe pulmonary arteries with the penumbra cat 8 device             4.  Selective catheter placement right upper lobe, middle lobe, and lower lobe pulmonary arteries             5.  Selective catheter placement left main pulmonary artery             6.  IVC filter placement             7.  Mechanical thrombectomy to the left external iliac vein with the penumbra cat 12 device  Patient without complaint this AM. Improved SOB and LLE pain.   Objective: Vitals:   01/20/19 1830 01/20/19 1900 01/20/19 2036 01/21/19 0432  BP: 118/69  (!) 125/91 114/81  Pulse: 97  98 87  Resp: 20  20 18   Temp: 98.7 F (37.1 C) 98.7 F (37.1 C) 98.4 F (36.9 C) 98.6 F (37 C)  TempSrc: Oral  Oral Oral  SpO2:   99% 96%  Weight:      Height:        Intake/Output Summary (Last 24 hours) at 01/21/2019 1038 Last data filed at 01/21/2019 0000 Gross per 24 hour  Intake 1212.82 ml  Output 850 ml  Net 362.82 ml   Physical Exam: A&Ox3, NAD CV: RRR Pulmonary: CTA Bilaterally Abdomen: Soft, Nontender, Nondistended Left Groin:  Access Site - PAD in place. Clean and dry. No swelling or drainage noted.  Vascular:  Left Lower Extremity: Thigh soft, calf soft extremity warm distally to toes.  Minimally tender to palpation.  Foot is warm.  Good capillary refill.  No discoloration.  Minimal swelling.  Motor/sensory intact.  There is no acute vascular compromise to extremity at this time.  Laboratory: CBC    Component Value Date/Time   WBC 8.3 01/21/2019 0707   HGB 13.7 01/21/2019 0707   HGB 18.1 (H) 01/15/2013 1156   HCT 41.1 01/21/2019 0707   HCT 52.6 (H) 01/15/2013 1156   PLT 201 01/21/2019 0707   PLT 295 01/15/2013 1156   BMET     Component Value Date/Time   NA 135 01/21/2019 0707   NA 137 01/15/2013 1156   K 3.9 01/21/2019 0707   K 3.8 01/15/2013 1156   CL 99 01/21/2019 0707   CL 105 01/15/2013 1156   CO2 23 01/21/2019 0707   CO2 27 01/15/2013 1156   GLUCOSE 262 (H) 01/21/2019 0707   GLUCOSE 120 (H) 01/15/2013 1156   BUN 10 01/21/2019 0707   BUN 17 01/15/2013 1156   CREATININE 0.99 01/21/2019 0707   CREATININE 1.22 01/15/2013 1156   CALCIUM 8.5 (L) 01/21/2019 0707   CALCIUM 9.1 01/15/2013 1156   GFRNONAA >60 01/21/2019 0707   GFRNONAA >60 01/15/2013 1156   GFRAA >60 01/21/2019 0707   GFRAA >60 01/15/2013 1156   Assessment/Planning: The patient is a 38 year old male with multiple medical issues who presented with propagation of his left lower extremity DVT and submassive PE now status post pulmonary thrombectomy / pulmonary thrombolysis, left lower extremity thrombectomy,  and IVC filter placement - POD#1 1) Improved shortness of breath, chest pain and left lower extremity discomfort this AM.  Patient is sitting up eating breakfast uncomfortably. 2) Transitioned from heparin to Eliquis - tolerating.  3) Currently back on his psychiatric home medication regimen 4) Started his outpatient diabetic medication regimen as per diabetes management. 5) Okay to discharge home my PAD is removed and no bleeding is noted from vascular standpoint. 6) Hopefully social work/case management can assist with financial issues with Eliquis on dishcarge. 7) Vascular Surgery to sign off at this time  Seen and examined with Dr. Ellis Parents [facility] PA-C 01/21/2019 10:38 AM

## 2019-01-21 NOTE — Discharge Instructions (Addendum)
Vascular Surgery Discharge Instructions: 1) you may shower.  Please keep your groins clean and dry. 2) please elevate your legs heart level or higher as often as you can. 3) please call our office if you have issues obtaining your blood thinning medication (Eliquis).     Pulmonary Embolism  A pulmonary embolism (PE) is a sudden blockage or decrease of blood flow in one or both lungs. Most blockages come from a blood clot that forms in the vein of a lower leg, thigh, or arm (deep vein thrombosis, DVT) and travels to the lungs. A clot is blood that has thickened into a gel or solid. PE is a dangerous and life-threatening condition that needs to be treated right away. What are the causes? This condition is usually caused by a blood clot that forms in a vein and moves to the lungs. In rare cases, it may be caused by air, fat, part of a tumor, or other tissue that moves through the veins and into the lungs. What increases the risk? The following factors may make you more likely to develop this condition:  Experiencing a traumatic injury, such as breaking a hip or leg.  Having: ? A spinal cord injury. ? Orthopedic surgery, especially hip or knee replacement. ? Any major surgery. ? A stroke. ? DVT. ? Blood clots or blood clotting disease. ? Long-term (chronic) lung or heart disease. ? Cancer treated with chemotherapy. ? A central venous catheter.  Taking medicines that contain estrogen. These include birth control pills and hormone replacement therapy.  Being: ? Pregnant. ? In the period of time after your baby is delivered (postpartum). ? Older than age 38. ? Overweight. ? A smoker, especially if you have other risks. What are the signs or symptoms? Symptoms of this condition usually start suddenly and include:  Shortness of breath during activity or at rest.  Coughing, coughing up blood, or coughing up blood-tinged mucus.  Chest pain that is often worse with deep  breaths.  Rapid or irregular heartbeat.  Feeling light-headed or dizzy.  Fainting.  Feeling anxious.  Fever.  Sweating.  Pain and swelling in a leg. This is a symptom of DVT, which can lead to PE. How is this diagnosed? This condition may be diagnosed based on:  Your medical history.  A physical exam.  Blood tests.  CT pulmonary angiogram. This test checks blood flow in and around your lungs.  Ventilation-perfusion scan, also called a lung VQ scan. This test measures air flow and blood flow to the lungs.  An ultrasound of the legs. How is this treated? Treatment for this condition depends on many factors, such as the cause of your PE, your risk for bleeding or developing more clots, and other medical conditions you have. Treatment aims to remove, dissolve, or stop blood clots from forming or growing larger. Treatment may include:  Medicines, such as: ? Blood thinning medicines (anticoagulants) to stop clots from forming. ? Medicines that dissolve clots (thrombolytics).  Procedures, such as: ? Using a flexible tube to remove a blood clot (embolectomy) or to deliver medicine to destroy it (catheter-directed thrombolysis). ? Inserting a filter into a large vein that carries blood to the heart (inferior vena cava). This filter (vena cava filter) catches blood clots before they reach the lungs. ? Surgery to remove the clot (surgical embolectomy). This is rare. You may need a combination of immediate, long-term (up to 3 months after diagnosis), and extended (more than 3 months after diagnosis) treatments. Your treatment  may continue for several months (maintenance therapy). You and your health care provider will work together to choose the treatment program that is best for you. Follow these instructions at home: Medicines  Take over-the-counter and prescription medicines only as told by your health care provider.  If you are taking an anticoagulant medicine: ? Take the  medicine every day at the same time each day. ? Understand what foods and drugs interact with your medicine. ? Understand the side effects of this medicine, including excessive bruising or bleeding. Ask your health care provider or pharmacist about other side effects. General instructions  Wear a medical alert bracelet or carry a medical alert card that says you have had a PE and lists what medicines you take.  Ask your health care provider when you may return to your normal activities. Avoid sitting or lying for a long time without moving.  Maintain a healthy weight. Ask your health care provider what weight is healthy for you.  Do not use any products that contain nicotine or tobacco, such as cigarettes, e-cigarettes, and chewing tobacco. If you need help quitting, ask your health care provider.  Talk with your health care provider about any travel plans. It is important to make sure that you are still able to take your medicine while on trips.  Keep all follow-up visits as told by your health care provider. This is important. Contact a health care provider if:  You missed a dose of your blood thinner medicine. Get help right away if:  You have: ? New or increased pain, swelling, warmth, or redness in an arm or leg. ? Numbness or tingling in an arm or leg. ? Shortness of breath during activity or at rest. ? A fever. ? Chest pain. ? A rapid or irregular heartbeat. ? A severe headache. ? Vision changes. ? A serious fall or accident, or you hit your head. ? Stomach (abdominal) pain. ? Blood in your vomit, stool, or urine. ? A cut that will not stop bleeding.  You cough up blood.  You feel light-headed or dizzy.  You cannot move your arms or legs.  You are confused or have memory loss. These symptoms may represent a serious problem that is an emergency. Do not wait to see if the symptoms will go away. Get medical help right away. Call your local emergency services (911 in the  [place] Do not drive yourself to the hospital. Summary  A pulmonary embolism (PE) is a sudden blockage or decrease of blood flow in one or both lungs. PE is a dangerous and life-threatening condition that needs to be treated right away.  Treatments for this condition usually include medicines to thin your blood (anticoagulants) or medicines to break apart blood clots (thrombolytics).  If you are given blood thinners, it is important to take the medicine every day at the same time each day.  Understand what foods and drugs interact with any medicines that you are taking.  If you have signs of PE or DVT, call your local emergency services (911 in the [place] This information is not intended to replace advice given to you by your health care provider. Make sure you discuss any questions you have with your health care provider. Document Released: 03/08/2000 Document Revised: 12/17/2017 Document Reviewed: 12/17/2017 Elsevier Patient Education  2020 Elsevier Inc.  Apixaban oral tablets What is this medicine? APIXABAN (a PIX a ban) is an anticoagulant (blood thinner). It is used to lower the chance of stroke in people  with a medical condition called atrial fibrillation. It is also used to treat or prevent blood clots in the lungs or in the veins. This medicine may be used for other purposes; ask your health care provider or pharmacist if you have questions. COMMON BRAND NAME(S): Eliquis What should I tell my health care provider before I take this medicine? They need to know if you have any of these conditions:  antiphospholipid antibody syndrome  bleeding disorders  bleeding in the brain  blood in your stools (black or tarry stools) or if you have blood in your vomit  history of blood clots  history of stomach bleeding  kidney disease  liver disease  mechanical heart valve  an unusual or allergic reaction to apixaban, other medicines, foods, dyes, or preservatives  pregnant or  trying to get pregnant  breast-feeding How should I use this medicine? Take this medicine by mouth with a glass of water. Follow the directions on the prescription label. You can take it with or without food. If it upsets your stomach, take it with food. Take your medicine at regular intervals. Do not take it more often than directed. Do not stop taking except on your doctor's advice. Stopping this medicine may increase your risk of a blood clot. Be sure to refill your prescription before you run out of medicine. Talk to your pediatrician regarding the use of this medicine in children. Special care may be needed. Overdosage: If you think you have taken too much of this medicine contact a poison control center or emergency room at once. NOTE: This medicine is only for you. Do not share this medicine with others. What if I miss a dose? If you miss a dose, take it as soon as you can. If it is almost time for your next dose, take only that dose. Do not take double or extra doses. What may interact with this medicine? This medicine may interact with the following:  aspirin and aspirin-like medicines  certain medicines for fungal infections like ketoconazole and itraconazole  certain medicines for seizures like carbamazepine and phenytoin  certain medicines that treat or prevent blood clots like warfarin, enoxaparin, and dalteparin  clarithromycin  NSAIDs, medicines for pain and inflammation, like ibuprofen or naproxen  rifampin  ritonavir  [place]'s wort This list may not describe all possible interactions. Give your health care provider a list of all the medicines, herbs, non-prescription drugs, or dietary supplements you use. Also tell them if you smoke, drink alcohol, or use illegal drugs. Some items may interact with your medicine. What should I watch for while using this medicine? Visit your healthcare professional for regular checks on your progress. You may need blood work done  while you are taking this medicine. Your condition will be monitored carefully while you are receiving this medicine. It is important not to miss any appointments. Avoid sports and activities that might cause injury while you are using this medicine. Severe falls or injuries can cause unseen bleeding. Be careful when using sharp tools or knives. Consider using an Copy. Take special care brushing or flossing your teeth. Report any injuries, bruising, or red spots on the skin to your healthcare professional. If you are going to need surgery or other procedure, tell your healthcare professional that you are taking this medicine. Wear a medical ID bracelet or chain. Carry a card that describes your disease and details of your medicine and dosage times. What side effects may I notice from receiving this medicine?  Side effects that you should report to your doctor or health care professional as soon as possible:  allergic reactions like skin rash, itching or hives, swelling of the face, lips, or tongue  signs and symptoms of bleeding such as bloody or black, tarry stools; red or dark-brown urine; spitting up blood or brown material that looks like coffee grounds; red spots on the skin; unusual bruising or bleeding from the eye, gums, or nose  signs and symptoms of a blood clot such as chest pain; shortness of breath; pain, swelling, or warmth in the leg  signs and symptoms of a stroke such as changes in vision; confusion; trouble speaking or understanding; severe headaches; sudden numbness or weakness of the face, arm or leg; trouble walking; dizziness; loss of coordination This list may not describe all possible side effects. Call your doctor for medical advice about side effects. You may report side effects to FDA at 1-800-FDA-1088. Where should I keep my medicine? Keep out of the reach of children. Store at room temperature between 20 and 25 degrees C (68 and 77 degrees F). Throw away any  unused medicine after the expiration date. NOTE: This sheet is a summary. It may not cover all possible information. If you have questions about this medicine, talk to your doctor, pharmacist, or health care provider.  2020 Elsevier/Gold Standard (2017-11-19 17:39:34)

## 2019-01-21 NOTE — Progress Notes (Addendum)
[place] for Eliquis Indication: pulmonary embolus  No Known Allergies  Patient Measurements: Height: 5\' 11"  (180.3 cm) Weight: 238 lb 8.6 oz (108.2 kg) IBW/kg (Calculated) : 75.3 HEPARIN DW (KG): 98.3  Vital Signs: Temp: 98.6 F (37 C) (10/29 0432) Temp Source: Oral (10/29 0432) BP: 114/81 (10/29 0432) Pulse Rate: 87 (10/29 0432)  Labs: Recent Labs    01/19/19 0434 01/19/19 1610  01/20/19 0224 01/20/19 0908 01/20/19 2359 01/21/19 0707  HGB 15.4  --   --  14.3  --   --  13.7  HCT 45.2  --   --  42.9  --   --  41.1  PLT 174  --   --  187  --   --  201  APTT 26  --   --   --   --   --   --   LABPROT 13.0  --   --   --   --   --   --   INR 1.0  --   --   --   --   --   --   HEPARINUNFRC  --   --    < > 0.33 0.31 0.26* 0.43  CREATININE 1.00  --   --  1.00  --   --  0.99  TROPONINIHS 5 6  --   --   --   --   --    < > = values in this interval not displayed.   Estimated Creatinine Clearance: 126.6 mL/min (by C-G formula based on SCr of 0.99 mg/dL).  Medical History: Past Medical History:  Diagnosis Date  . Bipolar 1 disorder ([place])   . Diabetes mellitus without complication ([place])    R6E 10.1 01/2017   . Herpes   . Hyperglycemia   . Patient denies medical problems    Medications:  Apixaban prior to admission. Per patient took x1 month but has been out of medication for two weeks.   Assessment: 38 y/o M with history of recently diagnosed DVT presents c/o dyspnea, chest pain, and worsening LE pain and swelling.   He was prescribed apixaban for treatment as an outpatient but has not taken the medication for approximately two weeks. Work-up significant for extensive sub-massive PE involving right main pulmonary artery and branches with evidence of right heart strain.   Patient is now post-pulmonary thrombectomy.   10/29 @ 0707 HL = 0.43, subtherapeutic  Goal of Therapy:  Monitor platelets by anticoagulation protocol: Yes    Plan:  Will discontinue therapeutic heparin drip and start [place] PE treatment dose @ 10am.  Pharmacy will continue to follow.   Lu Duffel, PharmD, BCPS Clinical Pharmacist 01/21/2019 7:46 AM

## 2019-01-21 NOTE — Progress Notes (Signed)
Inpatient Diabetes Program Recommendations  AACE/ADA: New Consensus Statement on Inpatient Glycemic Control (2015)  Target Ranges:  Prepandial:   less than 140 mg/dL      Peak postprandial:   less than 180 mg/dL (1-2 hours)      Critically ill patients:  140 - 180 mg/dL   Results for CORTNEY, MCKINNEY (MRN [id number]) as of 01/21/2019 08:49  Ref. Range 01/20/2019 08:06 01/20/2019 11:45 01/20/2019 15:21 01/20/2019 17:37 01/20/2019 21:40  Glucose-Capillary Latest Ref Range: 70 - 99 mg/dL 309 (H) 206 (H) 181 (H) 179 (H) 363 (H)    Results for MARCIO, HOQUE (MRN [id number]) as of 01/20/2019 07:22  Ref. Range 02/07/2017 10:48 01/19/2019 12:54  Hemoglobin A1C Latest Ref Range: 4.8 - 5.6 % 10.1 (H) 13.4 (H)  (337 mg/dl)     To ED with Acute massive PE with subsequent R ventricular strain/ DVT  History: DM, Bipolar disorder (off meds for a while per pt report)   Home DM Meds: Januvia 100 mg Daily (NOT taking)       Metformin 500 mg BID (NOT taking)   Current Orders: Novolog Moderate Correction Scale/ SSI (0-15 units) TID AC     Tradjenta 5 mg Daily    Note patient admitted to noncompliance with Diabetes medications on admission.  Current A1c of 13.4%. It also sounds like patient may have housing issues??    MD--Please consider the following:  1. For in-hospital CBG control, recommend we start Lantus 20 units Daily (0.2 units/kg dosing)   2. Recommend Metformin 1000 mg BID + Amaryl 4 mg Daily for home use.  Both of these meds are inexpensive and can be purchased for $4 each at [facility].   Needs to follow up with his PCP soon after discharge  DM Coordinator met w/pt yesterday  "pt told me he was never told he had diabetes.  When I asked about his MD visit back in Nov 2018, pt denied being told and stated that when he went to the mental institution they gave him rxs for diabetes meds but that he never took them.  Pt stated to me he thinks his "clot" is making his CBGs high  and that they will be normal again after the clot goes away.   It appears patient is in complete denial of his diabetes.  Note he has issues with Psychiatric illness and this is likely affecting his actions and judgement. "   --Will follow patient during hospitalization--  Tama Headings RN, MSN, BC-ADM Inpatient Diabetes Coordinator Team Pager [phone] (8a-5p)

## 2019-01-21 NOTE — Progress Notes (Signed)
ANTICOAGULATION CONSULT NOTE - Follow up [facility] for Heparin Indication: pulmonary embolus  No Known Allergies  Patient Measurements: Height: 5\' 11"  (180.3 cm) Weight: 238 lb 8.6 oz (108.2 kg) IBW/kg (Calculated) : 75.3 HEPARIN DW (KG): 98.3  Vital Signs: Temp: 98.4 F (36.9 C) (10/28 2036) Temp Source: Oral (10/28 2036) BP: 125/91 (10/28 2036) Pulse Rate: 98 (10/28 2036)  Labs: Recent Labs    01/19/19 0434 01/19/19 1093  01/20/19 0224 01/20/19 0908 01/20/19 2359  HGB 15.4  --   --  14.3  --   --   HCT 45.2  --   --  42.9  --   --   PLT 174  --   --  187  --   --   APTT 26  --   --   --   --   --   LABPROT 13.0  --   --   --   --   --   INR 1.0  --   --   --   --   --   HEPARINUNFRC  --   --    < > 0.33 0.31 0.26*  CREATININE 1.00  --   --  1.00  --   --   TROPONINIHS 5 6  --   --   --   --    < > = values in this interval not displayed.   Estimated Creatinine Clearance: 125.4 mL/min (by C-G formula based on SCr of 1 mg/dL).  Medical History: Past Medical History:  Diagnosis Date  . Bipolar 1 disorder ([place])   . Diabetes mellitus without complication ([place])    A3F 10.1 01/2017   . Herpes   . Hyperglycemia   . Patient denies medical problems    Medications:  Apixaban prior to admission. Per patient took x1 month but has been out of medication for two weeks.   Assessment: 38 y/o M with history of recently diagnosed DVT presents c/o dyspnea, chest pain, and worsening LE pain and swelling.   He was prescribed apixaban for treatment as an outpatient but has not taken the medication for approximately two weeks. Work-up significant for extensive sub-massive PE involving right main pulmonary artery and branches with evidence of right heart strain.   Plan for pulmonary thrombectomy. He is on the cath lab schedule for 10/28. Pharmacy consulted for heparin drip for PE. Do not anticipate apixaban related interference of heparin level due to last dose ~2  weeks ago.   10/28 @ 2359 HL = 0.26, subtherapeutic  Goal of Therapy:  Heparin level 0.3-0.7 units/ml Monitor platelets by anticoagulation protocol: Yes   Plan:  - Heparin 1000 unit bolus x 1 then increase drip to 1900 units/hr, recheck HL 6 hours after rate increase -CBC daily per protocol -Follow surgery/anticoagulation plan  Pharmacy will continue to follow.   Hart Robinsons A Pharmacy Resident 01/21/2019 12:38 AM

## 2019-01-21 NOTE — Progress Notes (Signed)
[facility] - [place] at [facility]   PATIENT NAME: Albert Miranda    MR#:  [id number]  DATE OF BIRTH:  01/13/1981  SUBJECTIVE:  No new complaints,SOB improving REVIEW OF SYSTEMS:  CONSTITUTIONAL: No fever, fatigue or weakness.  EYES: No blurred or double vision.  EARS, NOSE, AND THROAT: No tinnitus or ear pain.  RESPIRATORY: No cough, shortness of breath, wheezing or hemoptysis.  CARDIOVASCULAR: No chest pain, orthopnea, edema.  GASTROINTESTINAL: No nausea, vomiting, diarrhea or abdominal pain.  GENITOURINARY: No dysuria, hematuria.  ENDOCRINE: No polyuria, nocturia,  HEMATOLOGY: No anemia, easy bruising or bleeding SKIN: No rash or lesion. MUSCULOSKELETAL: No joint pain or arthritis.   NEUROLOGIC: No tingling, numbness, weakness.  PSYCHIATRY: No anxiety or depression.   DRUG ALLERGIES:  No Known Allergies  VITALS:  Blood pressure 122/77, pulse 96, temperature 97.7 F (36.5 C), temperature source Oral, resp. rate 18, height 5\' 11"  (1.803 m), weight 108.2 kg, SpO2 94 %.  PHYSICAL EXAMINATION:  GENERAL:  38 y.o.-year-old patient lying in the bed with no acute distress.  EYES: Pupils equal, round, reactive to light and accommodation. No scleral icterus. Extraocular muscles intact.  HEENT: Head atraumatic, normocephalic. Oropharynx and nasopharynx clear.  NECK:  Supple, no jugular venous distention. No thyroid enlargement, no tenderness.  LUNGS: Normal breath sounds bilaterally, no wheezing, rales,rhonchi or crepitation. No use of accessory muscles of respiration.  CARDIOVASCULAR: S1, S2 normal. No murmurs, rubs, or gallops.  ABDOMEN: Soft, nontender, nondistended. Bowel sounds present.  EXTREMITIES: No pedal edema, cyanosis, or clubbing.  NEUROLOGIC: Cranial nerves II through XII are intact. Muscle strength 5/5 in all extremities. Sensation intact. Gait not checked.  PSYCHIATRIC: The patient is alert and oriented x 3.  SKIN: No obvious rash, lesion, or  ulcer.    LABORATORY PANEL:   CBC Recent Labs  Lab 01/21/19 0707  WBC 8.3  HGB 13.7  HCT 41.1  PLT 201   ------------------------------------------------------------------------------------------------------------------  Chemistries  Recent Labs  Lab 01/20/19 0224 01/21/19 0707  NA 134* 135  K 4.0 3.9  CL 98 99  CO2 27 23  GLUCOSE 362* 262*  BUN 11 10  CREATININE 1.00 0.99  CALCIUM 8.7* 8.5*  MG 2.1  --    ------------------------------------------------------------------------------------------------------------------  Cardiac Enzymes No results for input(s): TROPONINI in the last 168 hours. ------------------------------------------------------------------------------------------------------------------  RADIOLOGY:  No results found.  EKG:   Orders placed or performed during the hospital encounter of 11/16/16  . EKG 12-Lead  . EKG 12-Lead  . ED EKG within 10 minutes  . ED EKG within 10 minutes  . EKG    ASSESSMENT AND PLAN:   1.  Acute massive PE with subsequent right ventricular strain.  With recent history of lower extremity DVT - s/p pulmonary thrombectomy, venous thrombolysis of the lower extremity and IVC filter placement on 10/30 - restart Eliquis  2.  Type 2 diabetes mellitus.  He has been off Metformin for a while.  He will be placed on supplemental coverage with subcutaneous NovoLog.  3.  History of depression anxiety and bipolar disorder.  restart his Abilify, Lamictal and Klonopin  4.  DVT prophylaxis.  Eliquis     All the records are reviewed and case discussed with Care Management/Social Workerr. Management plans discussed with the patient, he is in agreement.  He does not have any family members and friends.  Currently disabled CODE STATUS: fc   TOTAL  TIME TAKING CARE OF THIS PATIENT: 35  minutes.   POSSIBLE D/C  IN 1 DAYS, DEPENDING ON CLINICAL CONDITION.  Note: This dictation was prepared with Dragon dictation along with  smaller phrase technology. Any transcriptional errors that result from this process are unintentional.   Max Sane M.D on 01/21/2019 at 7:35 PM  Between 7am to 6pm - Pager - [phone] After 6pm go to [email or web address] - password [email or web address] [place] [facility]  Office  [phone]  CC: Primary care physician; System, Pcp Not In

## 2019-01-21 NOTE — Evaluation (Signed)
Physical Therapy Evaluation Patient Details Name: Albert Miranda MRN: [id number] DOB: 12/18/1980 Today's Date: 01/21/2019   History of Present Illness  Pt is a 38 y.o. male presenting to hospital 10/27 with L leg pain and swelling and mild chest pain.  Pt admitted with acute massive PE with subsequent R ventricular strain and significant L LE DVT.  10/28 s/p pulmonary thrombectomy/pulmonary thrombolysis, L LE thrombectomy, and IVC filter placement.  PMH includes DM, DVT, and bipolar disorder.  Clinical Impression  Prior to hospital admission, pt was independent and active working out.  Pt currently staying at a hotel.  Currently pt is independent with transfers and ambulation around nursing loop.  No SOB noted.  Pt's O2 sats 92% or greater on room air during session's activities; HR 104 bpm at beginning/end of session (pt standing at sink brushing his teeth upon PT arrival) and increased up to 117 bpm with activity.  Nurse notified of pt's vitals during session.  Pt educated to discuss any potential activity restrictions/precautions with his MD's.  No acute PT needs identified; will sign off.    Follow Up Recommendations No PT follow up    Equipment Recommendations  None recommended by PT    Recommendations for Other Services       Precautions / Restrictions Precautions Precautions: Fall Restrictions Weight Bearing Restrictions: No      Mobility  Bed Mobility               General bed mobility comments: Deferred (pt standing at sink brushing his teeth upon PT arrival)  Transfers Overall transfer level: Independent Equipment used: None             General transfer comment: steady safe transfers noted  Ambulation/Gait Ambulation/Gait assistance: Independent Gait Distance (Feet): 200 Feet Assistive device: None Gait Pattern/deviations: WFL(Within Functional Limits)     General Gait Details: steady ambulation; no SOB noted  Stairs            Wheelchair  Mobility    Modified Rankin (Stroke Patients Only)       Balance Overall balance assessment: No apparent balance deficits (not formally assessed)                                           Pertinent Vitals/Pain      Home Living Family/patient expects to be discharged to:: Other (Comment) Living Arrangements: Alone               Additional Comments: Currently staying at hotel    Prior Function Level of Independence: Independent         Comments: Active     Hand Dominance        Extremity/Trunk Assessment   Upper Extremity Assessment Upper Extremity Assessment: Overall WFL for tasks assessed    Lower Extremity Assessment Lower Extremity Assessment: Overall WFL for tasks assessed    Cervical / Trunk Assessment Cervical / Trunk Assessment: Normal  Communication   Communication: No difficulties  Cognition Arousal/Alertness: Awake/alert Behavior During Therapy: WFL for tasks assessed/performed Overall Cognitive Status: Within Functional Limits for tasks assessed                                        General Comments   Nursing cleared pt for participation in physical therapy  and reports pt ambulated earlier today with NT but used supplemental O2.  Pt agreeable to PT session.    Exercises     Assessment/Plan    PT Assessment Patent does not need any further PT services  PT Problem List         PT Treatment Interventions      PT Goals (Current goals can be found in the Care Plan section)  Acute Rehab PT Goals Patient Stated Goal: to discharge from hospital PT Goal Formulation: With patient Time For Goal Achievement: 02/04/19 Potential to Achieve Goals: Good    Frequency     Barriers to discharge        Co-evaluation               AM-PAC PT "6 Clicks" Mobility  Outcome Measure Help needed turning from your back to your side while in a flat bed without using bedrails?: None Help needed moving  from lying on your back to sitting on the side of a flat bed without using bedrails?: None Help needed moving to and from a bed to a chair (including a wheelchair)?: None Help needed standing up from a chair using your arms (e.g., wheelchair or bedside chair)?: None Help needed to walk in hospital room?: None Help needed climbing 3-5 steps with a railing? : None 6 Click Score: 24    End of Session   Activity Tolerance: Patient tolerated treatment well Patient left: in chair;with call bell/phone within reach Nurse Communication: Mobility status;Precautions;Other (comment)(Pt's O2 status during session) PT Visit Diagnosis: Muscle weakness (generalized) (M62.81)    Time: 4132-4401 PT Time Calculation (min) (ACUTE ONLY): 10 min   Charges:   PT Evaluation $PT Eval Low Complexity: 1 Low         Arrin Ishler, PT 01/21/19, 1:32 PM [phone]

## 2019-01-22 ENCOUNTER — Telehealth: Payer: Self-pay | Admitting: *Deleted

## 2019-01-22 ENCOUNTER — Telehealth: Payer: Self-pay | Admitting: Internal Medicine

## 2019-01-22 LAB — CBC
HCT: 41.5 % (ref 39.0–52.0)
Hemoglobin: 14.1 g/dL (ref 13.0–17.0)
MCH: 29.2 pg (ref 26.0–34.0)
MCHC: 34 g/dL (ref 30.0–36.0)
MCV: 85.9 fL (ref 80.0–100.0)
Platelets: 218 10*3/uL (ref 150–400)
RBC: 4.83 MIL/uL (ref 4.22–5.81)
RDW: 13.2 % (ref 11.5–15.5)
WBC: 7.2 10*3/uL (ref 4.0–10.5)
nRBC: 0 % (ref 0.0–0.2)

## 2019-01-22 LAB — BASIC METABOLIC PANEL
Anion gap: 14 (ref 5–15)
BUN: 12 mg/dL (ref 6–20)
CO2: 19 mmol/L — ABNORMAL LOW (ref 22–32)
Calcium: 8.8 mg/dL — ABNORMAL LOW (ref 8.9–10.3)
Chloride: 101 mmol/L (ref 98–111)
Creatinine, Ser: 1.06 mg/dL (ref 0.61–1.24)
GFR calc Af Amer: >60 mL/min (ref >60)
GFR calc non Af Amer: >60 mL/min (ref >60)
Glucose, Bld: 213 mg/dL — ABNORMAL HIGH (ref 70–99)
Potassium: 4.2 mmol/L (ref 3.5–5.1)
Sodium: 134 mmol/L — ABNORMAL LOW (ref 135–145)

## 2019-01-22 LAB — GLUCOSE, CAPILLARY: Glucose-Capillary: 188 mg/dL — ABNORMAL HIGH (ref 70–99)

## 2019-01-22 MED ORDER — APIXABAN 5 MG PO TABS: 5.0000 mg | ORAL_TABLET | Freq: Two times a day (BID) | ORAL | 0 refills | Status: DC

## 2019-01-22 NOTE — Care Management Important Message (Signed)
Important Message  Patient Details  Name: Albert Miranda MRN: [id number] Date of Birth: 11-Jan-1981   Medicare Important Message Given:  Yes     Juliann Pulse A Rilea Arutyunyan 01/22/2019, 10:43 AM

## 2019-01-22 NOTE — Telephone Encounter (Signed)
Called and LVM with mother Faraaz Wolin as patient does not have a telephone number listed to call. Nurse stated she was f/u after patient's hospital discharge. Nurse explained that the patient does have a scheduled hospital follow-up appointment with PCP 01/29/19 @ 1130 and that the nurse also wanted to ensure they did not have any questions or concerns regarding their discharge. Nurse left her callback number for patient and/or mother to reach her and she will call patient back later.

## 2019-01-22 NOTE — Progress Notes (Signed)
Pt discharged per MD order. IVs removed. Discharge instructions reviewed with pt. Pt verbalized understanding. All questions answered to pt satisfaction. Pt taken to car in wheelchair by staff.  

## 2019-01-22 NOTE — Telephone Encounter (Signed)
HFU/ Dx was Acute pulmonary DVT. Pt is being discharged to home on 01/22/2019 from [facility]. Pt HFU appt is on 01/29/2019 @ 11:30 am.

## 2019-01-23 ENCOUNTER — Emergency Department (HOSPITAL_COMMUNITY)
Admission: EM | Admit: 2019-01-23 | Discharge: 2019-01-23 | Disposition: A | Discharge status: Home / Self Care | Payer: Medicare Other | Attending: Emergency Medicine | Admitting: Emergency Medicine

## 2019-01-23 ENCOUNTER — Encounter (HOSPITAL_COMMUNITY): Payer: Self-pay | Admitting: Emergency Medicine

## 2019-01-23 ENCOUNTER — Other Ambulatory Visit: Payer: Self-pay

## 2019-01-23 ENCOUNTER — Emergency Department (HOSPITAL_COMMUNITY): Payer: Medicare Other

## 2019-01-23 DIAGNOSIS — M25551 Pain in right hip: Secondary | ICD-10-CM | POA: Diagnosis not present

## 2019-01-23 DIAGNOSIS — E119 Type 2 diabetes mellitus without complications: Secondary | ICD-10-CM | POA: Diagnosis not present

## 2019-01-23 DIAGNOSIS — Z79899 Other long term (current) drug therapy: Secondary | ICD-10-CM | POA: Diagnosis not present

## 2019-01-23 DIAGNOSIS — F17210 Nicotine dependence, cigarettes, uncomplicated: Secondary | ICD-10-CM | POA: Insufficient documentation

## 2019-01-23 DIAGNOSIS — R1030 Lower abdominal pain, unspecified: Secondary | ICD-10-CM | POA: Diagnosis not present

## 2019-01-23 DIAGNOSIS — R52 Pain, unspecified: Secondary | ICD-10-CM | POA: Diagnosis not present

## 2019-01-23 MED ORDER — CYCLOBENZAPRINE HCL 10 MG PO TABS
10.0000 mg | ORAL_TABLET | Freq: Once | ORAL | Status: AC
  Administered 2019-01-23: 10 mg via ORAL
  Filled 2019-01-23: qty 1

## 2019-01-23 MED ORDER — APIXABAN 5 MG PO TABS
10.0000 mg | ORAL_TABLET | Freq: Once | ORAL | Status: AC
  Administered 2019-01-23: 10 mg via ORAL
  Filled 2019-01-23: qty 2

## 2019-01-23 MED ORDER — CYCLOBENZAPRINE HCL 10 MG PO TABS: 10.0000 mg | ORAL_TABLET | Freq: Two times a day (BID) | ORAL | 0 refills | Status: DC | PRN

## 2019-01-23 NOTE — ED Provider Notes (Signed)
[facility]-EMERGENCY DEPT Provider Note   CSN: [id number] Arrival date & time: 01/23/19  1101     History   Chief Complaint Chief Complaint  Patient presents with  . Hip Pain    HPI Albert Miranda is a 38 y.o. male w PMHx DM, DVT, PE on eliquis, presenting to the ED with complaint of sudden onset of right hip pain that began last night. Pt states it feels as though his hip flexor is tight and that he pulled it, though no known injury. He has not swelling to the leg. Pain is worse with ambulation. No abd pain or testicular pain. No urinary sx. Pt has recent hx of DVT in LLE and PE. Has not missed any other doses of eliquis except his morning dose today.      The history is provided by the patient.    Past Medical History:  Diagnosis Date  . Bipolar 1 disorder (HCC)   . Diabetes mellitus without complication (HCC)    A1C 10.1 01/2017   . Herpes   . Hyperglycemia   . Patient denies medical problems     Patient Active Problem List   Diagnosis Date Noted  . Acute pulmonary embolism (HCC) 01/19/2019  . Herpes 02/20/2017  . Penile lesion 02/09/2017  . Type 2 diabetes mellitus with hyperglycemia, without long-term current use of insulin (HCC) 02/09/2017  . Elevated blood sugar 10/23/2016  . Schizoaffective disorder (HCC) 05/01/2016  . Cannabis abuse 05/01/2016  . Noncompliance 05/01/2016    Past Surgical History:  Procedure Laterality Date  . INCISION AND DRAINAGE ABSCESS     buttock gluteal region   . LEG SURGERY    . PULMONARY THROMBECTOMY N/A 01/20/2019   Procedure: PULMONARY THROMBECTOMY, LEFT LOWER EXTREMITY VENOUS LYSIS, IVF FILTER INSERTION;  Surgeon: Annice Needy, MD;  Location: [facility] INVASIVE CV LAB;  Service: Cardiovascular;  Laterality: N/A;        Home Medications    Prior to Admission medications   Medication Sig Start Date End Date Taking? Authorizing Provider  guaiFENesin (ROBITUSSIN) 100 MG/5ML SOLN Take 5 mLs by mouth every 4  (four) hours as needed for cough or to loosen phlegm.   Yes [provider]  ibuprofen (ADVIL) 200 MG tablet Take 200 mg by mouth every 6 (six) hours as needed for headache.   Yes [provider]  apixaban (ELIQUIS) 5 MG TABS tablet Take 1 tablet (5 mg total) by mouth 2 (two) times daily. Take 2 tablets 2x daily for seven days, then 1 2x daily 01/22/19   Delfino Lovett, MD  cyclobenzaprine (FLEXERIL) 10 MG tablet Take 1 tablet (10 mg total) by mouth 2 (two) times daily as needed for muscle spasms. 01/23/19   Nameer Summer, [place] N, PA-C    Family History Family History  Problem Relation Age of Onset  . Diabetes Father        dx age 20   . Depression Mother     Social History Social History   Tobacco Use  . Smoking status: Current Every Day Smoker    Packs/day: 1.00    Types: Cigarettes  . Smokeless tobacco: Never Used  . Tobacco comment: 1-1.5 ppd since since age 29 as of 02/07/17 does not want to quit  Substance Use Topics  . Alcohol use: Yes    Comment: occassional  . Drug use: Yes    Types: Marijuana    Comment: 1-2 x per month      Allergies  Patient has no known allergies.   Review of Systems Review of Systems  Musculoskeletal: Positive for myalgias.  All other systems reviewed and are negative.    Physical Exam Updated Vital Signs BP (!) 143/86 (BP Location: Right Arm)   Pulse 87   Temp 98 F (36.7 C)   Resp 18   Ht 5\' 11"  (1.803 m)   Wt 99.8 kg   SpO2 94%   BMI 30.68 kg/m   Physical Exam Vitals signs and nursing note reviewed.  Constitutional:      General: He is not in acute distress.    Appearance: He is well-developed. He is not ill-appearing.  HENT:     Head: Normocephalic and atraumatic.  Eyes:     Conjunctiva/sclera: Conjunctivae normal.  Cardiovascular:     Rate and Rhythm: Normal rate and regular rhythm.  Pulmonary:     Effort: Pulmonary effort is normal.     Breath sounds: Normal breath sounds.  Abdominal:     General:  Bowel sounds are normal.     Tenderness: There is no abdominal tenderness. There is no guarding or rebound.  Musculoskeletal:     Comments: Mild right inguinal TTP. No lymphadenopathy or skin changes. No swelling. Normal ROM of the right hip, does cause some mild pain. Intact distal pulses  Neurological:     Mental Status: He is alert.  Psychiatric:        Mood and Affect: Mood normal.        Behavior: Behavior normal.      ED Treatments / Results  Labs (all labs ordered are listed, but only abnormal results are displayed) Labs Reviewed - No data to display  EKG None  Radiology Dg Hip Unilat With Pelvis 2-3 Views Right  Result Date: 01/23/2019 CLINICAL DATA:  Groin pain.  History of blood clots. EXAM: DG HIP (WITH OR WITHOUT PELVIS) 2-3V RIGHT COMPARISON:  None. FINDINGS: Transitional anatomy is seen at L5-S1 with a left simulation joint. The hips are unremarkable. No acute abnormalities. IMPRESSION: No acute abnormality to explain the patient's pain. Electronically Signed   By: Gerome Samavid  Williams III M.D   On: 01/23/2019 13:00    Procedures Procedures (including critical care time)  Medications Ordered in ED Medications  cyclobenzaprine (FLEXERIL) tablet 10 mg (has no administration in time range)  apixaban (ELIQUIS) tablet 10 mg (10 mg Oral Given 01/23/19 1250)     Initial Impression / Assessment and Plan / ED Course  I have reviewed the triage vital signs and the nursing notes.  Pertinent labs & imaging results that were available during my care of the patient were reviewed by me and considered in my medical decision making (see chart for details).        Patient presenting with sudden onset of right groin pain that occurred last night.  States symptoms feels like a pulled muscle.  No particular injury noted.  Denies abdominal pain or testicular pain.  On exam, there is no swelling or redness of the distal extremity and minimal tenderness present on exam.  Normal range  of motion of the hip.  X-rays negative.  Abdominal exam is benign. Recurrent DVT seems less likely given sudden onset, compliance with eliquis, and absence of distal swelling or redness. Discussed symptomatic and strict return precautions. Reiterated importance of compliance with eliquis. Pt agrees with plan and is safe for discharge.  Discussed results, findings, treatment and follow up. Patient advised of return precautions. Patient verbalized understanding and agreed with plan.  Final Clinical Impressions(s) / ED Diagnoses   Final diagnoses:  Right hip pain    ED Discharge Orders         Ordered    cyclobenzaprine (FLEXERIL) 10 MG tablet  2 times daily PRN     01/23/19 1340           Alisa Stjames, [place] N, [place] 01/23/19 1343    Hayden Rasmussen, MD 01/24/19 [phone]

## 2019-01-23 NOTE — ED Notes (Signed)
Patient ambulated to the restroom without assistance 

## 2019-01-23 NOTE — Discharge Instructions (Addendum)
It is very important that you don't miss any doses of your eliquis. Take it with food. You can take flexeril every 12 hours for muscle spasm. Be aware this medication can make you drowsy. Follow up with your primary care if symptoms persist. If you start to develop swelling or redness in your leg, abdominal pain, or testicular pain, return to the ER for further evaluation.

## 2019-01-23 NOTE — ED Triage Notes (Signed)
Patient was picked up at a hotel by EMS due to patient complaints of right hip flexor pain. He has a history of blood clots in the past.    EMS vitals: 89 HR 18 Resp Rate  99% O2 sat on room air 170 palpated blood pressure 97.3 Temp

## 2019-01-25 NOTE — Telephone Encounter (Signed)
Called and LVM with mother Joren Rehm and stated that nurse was f/u after patient's discharge from the hospital. Nurse did explain that patient has a hospital f/u appointment with PCP 11/6/@ 1130 and to please calls for questions or concerns.

## 2019-01-26 ENCOUNTER — Telehealth: Payer: Self-pay

## 2019-01-26 NOTE — Telephone Encounter (Signed)
Third attempt to follow up with transitional care management. No answer. See earlier note. Will follow as appropriate.

## 2019-01-27 NOTE — Telephone Encounter (Signed)
Fourth attempt to follow up with tcm. No answer. Unable to leave a message.

## 2019-01-27 NOTE — Telephone Encounter (Signed)
Opened in error

## 2019-01-28 ENCOUNTER — Telehealth: Payer: Self-pay | Admitting: Internal Medicine

## 2019-01-28 NOTE — Discharge Summary (Signed)
[place] [facility] [place] NAME: Albert Miranda    MR#:  [id number]  DATE OF BIRTH:  13-Sep-1980  DATE OF ADMISSION:  01/19/2019   ADMITTING PHYSICIAN: Christel Mormon, MD  DATE OF DISCHARGE: 01/22/2019 11:33 AM  PRIMARY CARE PHYSICIAN: McLean-Scocuzza, Nino Glow, MD   ADMISSION DIAGNOSIS:  Pulmonary embolism ([place]) [I26.99] Acute deep vein thrombosis (DVT) of femoral vein of left lower extremity (HCC) [I82.412] Acute saddle pulmonary embolism with acute cor pulmonale ([place]) [I26.02] DISCHARGE DIAGNOSIS:  Active Problems:   Acute pulmonary embolism ([place])  SECONDARY DIAGNOSIS:   Past Medical History:  Diagnosis Date  . Bipolar 1 disorder ([place])   . Diabetes mellitus without complication ([place])    Z7H 10.1 01/2017   . Herpes   . Hyperglycemia   . Patient denies medical problems    HOSPITAL COURSE:  1. Acute massive PE with subsequent right ventricular strain.  With recent history of lower extremity DVT - s/p pulmonary thrombectomy, venous thrombolysis of the lower extremity and IVC filter placement on 10/30 - restarted Eliquis  2. Type 2 diabetes mellitus  3. History of depression anxiety andbipolar disorder - continue Abilify, Lamictal and Klonopin  DISCHARGE CONDITIONS:  stable CONSULTS OBTAINED:   DRUG ALLERGIES:  No Known Allergies DISCHARGE MEDICATIONS:   Allergies as of 01/22/2019   No Known Allergies     Medication List    STOP taking these medications   imiquimod 5 % cream Commonly known as: Aldara   metFORMIN 500 MG tablet Commonly known as: GLUCOPHAGE   sitaGLIPtin 100 MG tablet Commonly known as: Januvia   valACYclovir 500 MG tablet Commonly known as: VALTREX     TAKE these medications   apixaban 5 MG Tabs tablet Commonly known as: ELIQUIS Take 1 tablet (5 mg total) by mouth 2 (two) times daily. Take 2 tablets 2x daily for seven days, then 1 2x daily        DISCHARGE INSTRUCTIONS:   DIET:  Regular diet  DISCHARGE CONDITION:  Good ACTIVITY:  Activity as tolerated OXYGEN:  Home Oxygen: No.  Oxygen Delivery: room air DISCHARGE LOCATION:  home   If you experience worsening of your admission symptoms, develop shortness of breath, life threatening emergency, suicidal or homicidal thoughts you must seek medical attention immediately by calling 911 or calling your MD immediately  if symptoms less severe.  You Must read complete instructions/literature along with all the possible adverse reactions/side effects for all the Medicines you take and that have been prescribed to you. Take any new Medicines after you have completely understood and accpet all the possible adverse reactions/side effects.   Please note  You were cared for by a hospitalist during your hospital stay. If you have any questions about your discharge medications or the care you received while you were in the hospital after you are discharged, you can call the unit and asked to speak with the hospitalist on call if the hospitalist that took care of you is not available. Once you are discharged, your primary care physician will handle any further medical issues. Please note that NO REFILLS for any discharge medications will be authorized once you are discharged, as it is imperative that you return to your primary care physician (or establish a relationship with a primary care physician if you do not have one) for your aftercare needs so that they can reassess your need for medications and monitor your lab values.    On the day of Discharge:  VITAL SIGNS:  Blood pressure 126/75, pulse 95, temperature 98.2 F (36.8 C), temperature source Oral, resp. rate 20, height 5\' 11"  (1.803 m), weight 108.2 kg, SpO2 99 %. PHYSICAL EXAMINATION:  GENERAL:  38 y.o.-year-old patient lying in the bed with no acute distress.  EYES: Pupils equal, round, reactive to light and accommodation. No scleral icterus. Extraocular muscles intact.  HEENT: Head  atraumatic, normocephalic. Oropharynx and nasopharynx clear.  NECK:  Supple, no jugular venous distention. No thyroid enlargement, no tenderness.  LUNGS: Normal breath sounds bilaterally, no wheezing, rales,rhonchi or crepitation. No use of accessory muscles of respiration.  CARDIOVASCULAR: S1, S2 normal. No murmurs, rubs, or gallops.  ABDOMEN: Soft, non-tender, non-distended. Bowel sounds present. No organomegaly or mass.  EXTREMITIES: No pedal edema, cyanosis, or clubbing.  NEUROLOGIC: Cranial nerves II through XII are intact. Muscle strength 5/5 in all extremities. Sensation intact. Gait not checked.  PSYCHIATRIC: The patient is alert and oriented x 3.  SKIN: No obvious rash, lesion, or ulcer.  DATA REVIEW:   CBC Recent Labs  Lab 01/22/19 0714  WBC 7.2  HGB 14.1  HCT 41.5  PLT 218    Chemistries  Recent Labs  Lab 01/22/19 0714  NA 134*  K 4.2  CL 101  CO2 19*  GLUCOSE 213*  BUN 12  CREATININE 1.06  CALCIUM 8.8*     Follow-up Information    01/24/19, MD. Go on 03/02/2019.   Specialties: Vascular Surgery, Radiology, Interventional Cardiology Why: PE, DVT, IVC Filter. Will need left lower extremity DVT study with visit. Can see Dew or 14/10/2018.     3pm appointment  Contact information: [id number] [place] [address]        McLean-Scocuzza, [phone], MD. Go on 01/29/2019.   Specialty: Internal Medicine Why: 11:30am appointment  Contact information: [address] [place] [phone]           Management plans discussed with the patient, family and they are in agreement.  CODE STATUS: Prior   TOTAL TIME TAKING CARE OF THIS PATIENT: 45 minutes.    [phone] M.D on 01/28/2019 at 11:34 AM  Between 7am to 6pm - Pager - [phone]  After 6pm go to [email or web address] - password [email or web address]  [facility]   CC: Primary care physician; McLean-Scocuzza, 03-07-1978, MD   Note: This dictation was prepared with Dragon  dictation along with smaller phrase technology. Any transcriptional errors that result from this process are unintentional.

## 2019-01-28 NOTE — Telephone Encounter (Signed)
I called pt twice and left vm to call ofc. °

## 2019-01-29 ENCOUNTER — Ambulatory Visit: Payer: Medicare Other | Admitting: Internal Medicine

## 2019-02-02 ENCOUNTER — Ambulatory Visit: Payer: Medicare Other | Admitting: Internal Medicine

## 2019-03-01 ENCOUNTER — Other Ambulatory Visit (INDEPENDENT_AMBULATORY_CARE_PROVIDER_SITE_OTHER): Payer: Self-pay | Admitting: Vascular Surgery

## 2019-03-01 DIAGNOSIS — Z95828 Presence of other vascular implants and grafts: Secondary | ICD-10-CM

## 2019-03-01 DIAGNOSIS — I82412 Acute embolism and thrombosis of left femoral vein: Secondary | ICD-10-CM

## 2019-03-02 ENCOUNTER — Encounter (INDEPENDENT_AMBULATORY_CARE_PROVIDER_SITE_OTHER): Payer: Medicare Other

## 2019-03-02 ENCOUNTER — Ambulatory Visit (INDEPENDENT_AMBULATORY_CARE_PROVIDER_SITE_OTHER): Payer: Medicare Other | Admitting: Nurse Practitioner

## 2019-06-14 ENCOUNTER — Encounter (HOSPITAL_COMMUNITY): Payer: Self-pay | Admitting: Emergency Medicine

## 2019-06-14 ENCOUNTER — Emergency Department (HOSPITAL_COMMUNITY)
Admission: EM | Admit: 2019-06-14 | Discharge: 2019-06-24 | Disposition: A | Discharge status: Home / Self Care | Payer: Medicare Other | Attending: Emergency Medicine | Admitting: Emergency Medicine

## 2019-06-14 ENCOUNTER — Other Ambulatory Visit: Payer: Self-pay

## 2019-06-14 DIAGNOSIS — E119 Type 2 diabetes mellitus without complications: Secondary | ICD-10-CM | POA: Insufficient documentation

## 2019-06-14 DIAGNOSIS — R4689 Other symptoms and signs involving appearance and behavior: Secondary | ICD-10-CM

## 2019-06-14 DIAGNOSIS — F319 Bipolar disorder, unspecified: Secondary | ICD-10-CM | POA: Insufficient documentation

## 2019-06-14 DIAGNOSIS — F17210 Nicotine dependence, cigarettes, uncomplicated: Secondary | ICD-10-CM | POA: Diagnosis not present

## 2019-06-14 DIAGNOSIS — I2699 Other pulmonary embolism without acute cor pulmonale: Secondary | ICD-10-CM | POA: Diagnosis not present

## 2019-06-14 DIAGNOSIS — F1510 Other stimulant abuse, uncomplicated: Secondary | ICD-10-CM | POA: Insufficient documentation

## 2019-06-14 DIAGNOSIS — F259 Schizoaffective disorder, unspecified: Secondary | ICD-10-CM | POA: Diagnosis not present

## 2019-06-14 DIAGNOSIS — R462 Strange and inexplicable behavior: Secondary | ICD-10-CM

## 2019-06-14 DIAGNOSIS — Z046 Encounter for general psychiatric examination, requested by authority: Secondary | ICD-10-CM | POA: Diagnosis present

## 2019-06-14 DIAGNOSIS — Z7901 Long term (current) use of anticoagulants: Secondary | ICD-10-CM | POA: Diagnosis not present

## 2019-06-14 DIAGNOSIS — Z20822 Contact with and (suspected) exposure to covid-19: Secondary | ICD-10-CM | POA: Diagnosis not present

## 2019-06-14 LAB — CBC
HCT: 42.5 % (ref 39.0–52.0)
Hemoglobin: 14 g/dL (ref 13.0–17.0)
MCH: 29.6 pg (ref 26.0–34.0)
MCHC: 32.9 g/dL (ref 30.0–36.0)
MCV: 89.9 fL (ref 80.0–100.0)
Platelets: 212 10*3/uL (ref 150–400)
RBC: 4.73 MIL/uL (ref 4.22–5.81)
RDW: 12.3 % (ref 11.5–15.5)
WBC: 4.8 10*3/uL (ref 4.0–10.5)
nRBC: 0 % (ref 0.0–0.2)

## 2019-06-14 LAB — COMPREHENSIVE METABOLIC PANEL
ALT: 26 U/L (ref 0–44)
AST: 22 U/L (ref 15–41)
Albumin: 3.8 g/dL (ref 3.5–5.0)
Alkaline Phosphatase: 76 U/L (ref 38–126)
Anion gap: 7 (ref 5–15)
BUN: 11 mg/dL (ref 6–20)
CO2: 27 mmol/L (ref 22–32)
Calcium: 8.7 mg/dL — ABNORMAL LOW (ref 8.9–10.3)
Chloride: 105 mmol/L (ref 98–111)
Creatinine, Ser: 1.11 mg/dL (ref 0.61–1.24)
GFR calc Af Amer: >60 mL/min (ref >60)
GFR calc non Af Amer: >60 mL/min (ref >60)
Glucose, Bld: 153 mg/dL — ABNORMAL HIGH (ref 70–99)
Potassium: 3.6 mmol/L (ref 3.5–5.1)
Sodium: 139 mmol/L (ref 135–145)
Total Bilirubin: 0.7 mg/dL (ref 0.3–1.2)
Total Protein: 7.2 g/dL (ref 6.5–8.1)

## 2019-06-14 LAB — RAPID URINE DRUG SCREEN, HOSP PERFORMED
Amphetamines: NOT DETECTED
Barbiturates: NOT DETECTED
Benzodiazepines: NOT DETECTED
Cocaine: NOT DETECTED
Opiates: NOT DETECTED
Tetrahydrocannabinol: NOT DETECTED

## 2019-06-14 LAB — SALICYLATE LEVEL: Salicylate Lvl: <7 mg/dL — ABNORMAL LOW (ref 7.0–30.0)

## 2019-06-14 LAB — ACETAMINOPHEN LEVEL: Acetaminophen (Tylenol), Serum: <10 ug/mL — ABNORMAL LOW (ref 10–30)

## 2019-06-14 LAB — ETHANOL: Alcohol, Ethyl (B): <10 mg/dL (ref <10)

## 2019-06-14 MED ORDER — ACETAMINOPHEN 325 MG PO TABS
650.0000 mg | ORAL_TABLET | ORAL | Status: DC | PRN
  Administered 2019-06-19 – 2019-06-24 (×5): 650 mg via ORAL
  Filled 2019-06-14 (×5): qty 2

## 2019-06-14 MED ORDER — ZIPRASIDONE MESYLATE 20 MG IM SOLR
20.0000 mg | Freq: Once | INTRAMUSCULAR | Status: AC
  Administered 2019-06-14: 20 mg via INTRAMUSCULAR
  Filled 2019-06-14: qty 20

## 2019-06-14 MED ORDER — ALUM & MAG HYDROXIDE-SIMETH 200-200-20 MG/5ML PO SUSP: 30.0000 mL | Freq: Four times a day (QID) | ORAL | Status: DC | PRN

## 2019-06-14 MED ORDER — DROPERIDOL 2.5 MG/ML IJ SOLN: 5.0000 mg | Freq: Once | INTRAMUSCULAR | Status: DC

## 2019-06-14 MED ORDER — ZOLPIDEM TARTRATE 5 MG PO TABS
5.0000 mg | ORAL_TABLET | Freq: Every evening | ORAL | Status: DC | PRN
  Administered 2019-06-16 – 2019-06-23 (×3): 5 mg via ORAL
  Filled 2019-06-14 (×4): qty 1

## 2019-06-14 MED ORDER — STERILE WATER FOR INJECTION IJ SOLN
INTRAMUSCULAR | Status: AC
  Administered 2019-06-14: 1.2 mL
  Filled 2019-06-14: qty 10

## 2019-06-14 MED ORDER — ONDANSETRON HCL 4 MG PO TABS: 4.0000 mg | ORAL_TABLET | Freq: Three times a day (TID) | ORAL | Status: DC | PRN
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
## 2019-06-14 NOTE — ED Provider Notes (Signed)
Received patient at signout from PA Albrizze.  Refer to provider note for full history and physical examination.  Briefly, patient is a 39 year old male with history of bipolar 1 disorder, schizoaffective disorder, diabetes, substance abuse and PE not currently on anticoagulation presenting under IVC.  The patient was reportedly in jail for the last 4 months and was released today.  He was IVC by the [facility] department for aggressive behavior and bizarre behavior.  He is pending [name] evaluation and medical clearance   MDM   Labs Reviewed  COMPREHENSIVE METABOLIC PANEL - Abnormal; Notable for the following components:      Result Value   Glucose, Bld 153 (*)    Calcium 8.7 (*)    All other components within normal limits  SALICYLATE LEVEL - Abnormal; Notable for the following components:   Salicylate Lvl <7.0 (*)    All other components within normal limits  ACETAMINOPHEN LEVEL - Abnormal; Notable for the following components:   Acetaminophen (Tylenol), Serum <10 (*)    All other components within normal limits  RESPIRATORY PANEL BY RT PCR (FLU A&B, COVID)  ETHANOL  CBC  RAPID URINE DRUG SCREEN, HOSP PERFORMED    Lab work is reassuring.  While in the ED the patient became aggressive, EKG obtained which showed no QT prolongation and he was given IM Geodon.  [name] recommends inpatient admission.      Jeanie Sewer, PA-C 06/15/19 Juan Quam, MD 06/15/19 [phone]
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
## 2019-06-14 NOTE — Progress Notes (Signed)
Patient meets inpatient criteria per Assunta Found, NP. Patient has been faxed out to the following facilities for review:   [facility]  [facility]  [facility]  [facility]  CSW will continue to follow and assist with disposition planning.   Drucilla Schmidt, MSW, LCSW-A Clinical Disposition Social Worker [facility]/[name] [phone]

## 2019-06-15 DIAGNOSIS — F319 Bipolar disorder, unspecified: Secondary | ICD-10-CM | POA: Diagnosis not present

## 2019-06-15 MED ORDER — HALOPERIDOL LACTATE 5 MG/ML IJ SOLN
10.0000 mg | Freq: Once | INTRAMUSCULAR | Status: AC
  Administered 2019-06-15: 10 mg via INTRAMUSCULAR
  Filled 2019-06-15: qty 2

## 2019-06-15 MED ORDER — LORAZEPAM 2 MG/ML IJ SOLN
2.0000 mg | Freq: Once | INTRAMUSCULAR | Status: AC
  Administered 2019-06-15: 2 mg via INTRAMUSCULAR
  Filled 2019-06-15: qty 1

## 2019-06-15 NOTE — ED Notes (Signed)
Pt showering and police at bedside.
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
## 2019-06-15 NOTE — ED Notes (Signed)
Pt was given meal tray.  

## 2019-06-15 NOTE — ED Provider Notes (Signed)
Emergency Medicine Observation Re-evaluation Note  Albert Miranda is a 39 y.o. male, seen on rounds today.  Pt initially presented to the ED for complaints of V70.1 Currently, the patient is resting.  Physical Exam  BP 120/78 (BP Location: Left Arm)   Pulse (!) 52   Temp 98.6 F (37 C) (Oral)   Resp 16   Ht 5' 10.5" (1.791 m)   Wt 99.8 kg   SpO2 99%   BMI 31.12 kg/m  Physical Exam  ED Course / MDM  EKG:EKG Interpretation  Date/Time:  Monday June 14 2019 17:57:19 EDT Ventricular Rate:  63 PR Interval:  130 QRS Duration: 70 QT Interval:  434 QTC Calculation: 444 R Axis:   30 Text Interpretation: Normal sinus rhythm Low voltage QRS Borderline ECG Confirmed by Geoffery Lyons ([id number]) on 06/14/2019 7:42:04 PM    I have reviewed the labs performed to date as well as medications administered while in observation.  Recent changes in the last 24 hours include [name] eval. Plan  Current plan is for inpatient placement. Patient is under full IVC at this time.   Terrilee Files, MD 06/15/19 [phone]
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
## 2019-06-16 DIAGNOSIS — F319 Bipolar disorder, unspecified: Secondary | ICD-10-CM | POA: Diagnosis not present

## 2019-06-16 LAB — RESPIRATORY PANEL BY RT PCR (FLU A&B, COVID)
Influenza A by PCR: NEGATIVE
Influenza B by PCR: NEGATIVE
SARS Coronavirus 2 by RT PCR: NEGATIVE

## 2019-06-16 MED ORDER — RISPERIDONE 1 MG PO TBDP
1.0000 mg | ORAL_TABLET | Freq: Two times a day (BID) | ORAL | Status: DC
  Administered 2019-06-16 – 2019-06-20 (×7): 1 mg via ORAL
  Filled 2019-06-16 (×8): qty 1

## 2019-06-16 MED ORDER — DIVALPROEX SODIUM 250 MG PO DR TAB
250.0000 mg | DELAYED_RELEASE_TABLET | Freq: Three times a day (TID) | ORAL | Status: DC
  Administered 2019-06-16 – 2019-06-24 (×20): 250 mg via ORAL
  Filled 2019-06-16 (×22): qty 1

## 2019-06-16 NOTE — Progress Notes (Signed)
Pt continues to meet inpatient criteria. Referral information has been faxed to the following hospitals for review:  [facility] Details [facility] Details [facility] Details  [facility] Details [facility] Details  [facility] Details [facility] Details  [facility] Details [facility] Details  [facility] Details   Disposition will continue to assist with inpatient placement needs.   Wells Guiles, LCSW, LCAS Disposition CSW [facility]/[name] [phone]

## 2019-06-16 NOTE — Progress Notes (Signed)
Chart reviewed. Patient under IVC for aggressive/bizarre behaviors and recommended for psychiatric hospitalization, currently awaiting behavioral health bed. History of bipolar 1 disorder, schizoaffective disorder, and substance abuse. Patient was recently released from prison and has history of aggressive behaviors. He required restraints and IM medications in the ED last night due to aggressive behaviors. Will initiate Depakote 250 mg PO TID and Risperdal 1 mg PO BID.

## 2019-06-16 NOTE — ED Provider Notes (Signed)
Emergency Medicine Observation Re-evaluation Note  ARJAN STROHM is a 39 y.o. male, seen on rounds today.  Pt initially presented to the ED for complaints of V70.1 Currently, the patient is awaiting placement.  Physical Exam  BP 118/65 (BP Location: Left Arm)   Pulse 69   Temp 98.5 F (36.9 C) (Oral)   Resp 18   Ht 5' 10.5" (1.791 m)   Wt 99.8 kg   SpO2 99%   BMI 31.12 kg/m  Physical Exam  Alert and following commands.  Even, unlabored respirations.   ED Course / MDM  EKG:EKG Interpretation  Date/Time:  Monday June 14 2019 17:57:19 EDT Ventricular Rate:  63 PR Interval:  130 QRS Duration: 70 QT Interval:  434 QTC Calculation: 444 R Axis:   30 Text Interpretation: Normal sinus rhythm Low voltage QRS Borderline ECG Confirmed by Geoffery Lyons ([id number]) on 06/14/2019 7:42:04 PM    I have reviewed the labs performed to date as well as medications administered while in observation.  Non recent changes in the last 24 hours. Patient was restrained yesterday but was able to be released.  Plan  Current plan is for placement from the ED. Patient is under full IVC at this time.   Maia Plan, MD 06/16/19 [phone]

## 2019-06-17 NOTE — ED Notes (Signed)
Pt wanted update on D/C. Told pt that [facility] attempted to contact his mother without success and that he was not up for D/C. Pt gave me his mother's contact info listed in chart. Pt standing in doorway watching staff.

## 2019-06-17 NOTE — ED Notes (Addendum)
Pt is adamantly refusing Risperdal and Depakote. States the medications "are a mistake. You can chang it in the system." Pt has been known to be violent and is refusing to take medication. Will address at a later time.

## 2019-06-17 NOTE — BHH Counselor (Signed)
[name] reassessment: Patient is alert and oriented x 4. He is calm and cooperative during assessment. Patient states he was released on jail several days ago for "time served." Patient states he was expecting to be released from ED yesterday and does not know why he is in the hospital. Patient does say he has a history of mental illness and reports he has been given medications in the hospital. He denies SI/HI/AVH. Patient appears somewhat paranoid as he is saying when he gets out he needs to be a "good Samaritan" and look up laws because the medication they are giving him is not correct.   Patient gives verbal consent for [name] to contact his mother, Kamir Selover, for collateral information. [name] attempted to reach her but she did not answer.   Per ED notes patient has refused medications and has been physically, verbally aggressive while in ED. His UDS is negative. Patient will likely need a 500 [address] type bed.   CSW contacted [facility]. Patient is no longer in their custody and can therefore be placed in a psychiatric hospital.  Discussed with Denzil Magnuson, NP who continues to recommend in patient treatment based on initial assessment and noted behavior in the ED.

## 2019-06-18 DIAGNOSIS — F319 Bipolar disorder, unspecified: Secondary | ICD-10-CM | POA: Diagnosis not present

## 2019-06-18 MED ORDER — ZIPRASIDONE MESYLATE 20 MG IM SOLR
INTRAMUSCULAR | Status: AC
  Administered 2019-06-18: 20 mg via INTRAMUSCULAR
  Filled 2019-06-18: qty 20

## 2019-06-18 MED ORDER — STERILE WATER FOR INJECTION IJ SOLN
INTRAMUSCULAR | Status: AC
  Administered 2019-06-18: 2.1 mL
  Filled 2019-06-18: qty 10

## 2019-06-18 MED ORDER — ZIPRASIDONE MESYLATE 20 MG IM SOLR: 20.0000 mg | Freq: Once | INTRAMUSCULAR | Status: AC

## 2019-06-18 NOTE — ED Notes (Signed)
Patient appears restless. Patient has entered and exited room multiple times. Walked by staff with a bottle lotion and said "I've got business to handle" as he headed towards bathroom.

## 2019-06-18 NOTE — ED Notes (Signed)
Patient getting aggressive with staff and Security at this time. C-com called for [facility] deputy to come to ED to sit with patient. [employer]  Was sent urgently until a deputy could arrive.

## 2019-06-18 NOTE — BH Assessment (Signed)
Reassessment Note: Pt presents sitting upright on bed with right wrist handcuffed to bed. He is calm with somewhat delayed verbal responses. He is oriented to person, place and situation. Pt asking some procedural questions as to where & when he will go next. Admission process was explained to pt. Pt denies SI and HI. He states he has been thinking about "rehabilitating himself to how he want to be. Pt denies AVH. 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  Pt advised last names aren't provided, but confirmed we can be reached through [facility]. Inpt tx continues to be recommended.

## 2019-06-18 NOTE — ED Provider Notes (Signed)
Patient became acutely agitated and combative and required sedation.  He is given injection of ziprasidone.  This is for patient safety and safety of staff.  Following injection, patient became calm and was able to go to sleep.  CRITICAL CARE Performed by: Dione Booze Total critical care time: 35 minutes Critical care time was exclusive of separately billable procedures and treating other patients. Critical care was necessary to treat or prevent imminent or life-threatening deterioration. Critical care was time spent personally by me on the following activities: development of treatment plan with patient and/or surrogate as well as nursing, discussions with consultants, evaluation of patient's response to treatment, examination of patient, obtaining history from patient or surrogate, ordering and performing treatments and interventions, ordering and review of laboratory studies, ordering and review of radiographic studies, pulse oximetry and re-evaluation of patient's condition.   Dione Booze, MD 06/18/19 [phone]

## 2019-06-18 NOTE — BHH Counselor (Signed)
Patient referred to [facility]. Completed [facility] referral form. Obtained authorization from [place].  The authorization number is #[id number] (06-18-19 to 06-24-19). Completed phone referral with Maurine Minister at [facility]. Faxed documents to [facility] for review.   Patient is awaiting acceptance to the [facility] wait list. Counselor has also requested patient to be placed on their priority wait list and faxed [facility] the supporting documentation.

## 2019-06-19 DIAGNOSIS — F319 Bipolar disorder, unspecified: Secondary | ICD-10-CM | POA: Diagnosis not present

## 2019-06-19 NOTE — ED Notes (Signed)
Telepsych assessment in progress at this time. 

## 2019-06-19 NOTE — ED Notes (Signed)
Pt showered.  Fresh scrubs and linens.
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
## 2019-06-19 NOTE — ED Notes (Signed)
Pt talking with tele psych

## 2019-06-20 DIAGNOSIS — F319 Bipolar disorder, unspecified: Secondary | ICD-10-CM | POA: Diagnosis not present

## 2019-06-20 MED ORDER — ZIPRASIDONE MESYLATE 20 MG IM SOLR: 20.0000 mg | Freq: Once | INTRAMUSCULAR | Status: AC

## 2019-06-20 MED ORDER — ZIPRASIDONE MESYLATE 20 MG IM SOLR
INTRAMUSCULAR | Status: AC
  Administered 2019-06-20: 12:00:00 20 mg via INTRAMUSCULAR
  Filled 2019-06-20: qty 20

## 2019-06-20 NOTE — ED Provider Notes (Signed)
Pt becoming agitated and aggressive with staff. Reviewed ekg and chart. Pt has had Geodon for sedation in the ED previously during his stay.   On my evaluation, pt is repeatedly asking for a shower/bathroom, unwilling to wait for a male RN to assist. He is aggressive. Will give Geodon IM.   On reassessment following injection, pt resting comfortably.   CRITICAL CARE Performed by: Alveria Apley   Total critical care time: 30 minutes  Critical care time was exclusive of separately billable procedures and treating other patients.  Critical care was necessary to treat or prevent imminent or life-threatening deterioration.  Critical care was time spent personally by me on the following activities: development of treatment plan with patient and/or surrogate as well as nursing, discussions with consultants, evaluation of patient's response to treatment, examination of patient, obtaining history from patient or surrogate, ordering and performing treatments and interventions, ordering and review of laboratory studies, ordering and review of radiographic studies, pulse oximetry and re-evaluation of patient's condition.    Alveria Apley, PA-C 06/20/19 1312    Eber Hong, MD 06/21/19 [phone]

## 2019-06-20 NOTE — BHH Counselor (Addendum)
Re-assessment:   Patient started rambling as soon as the re-assessment started. When asked his name patient responded, giving four different names 'Albert Miranda', 'Albert Miranda', 'Albert Miranda', and 'Albert Miranda.'  The patient continues to provide the [name] assessor with different names for different reasons, such as hip hop artist, regular person, hip hop dad, sexual intercourse expert, and medical expert. Patient oriented times 4x. When asked if he wanted to hurt himself patient stated, "give me a second," then stated, "no." The patient asked the [name] assessor to type his height. Report sometimes he wants to hurt other people. When asked who does he want to hurt, he stated, "[name] #45." The patient stated [name] was on the screen (he was referring to [name] assessor). He said [name] assessor's name was [name] as he appeared on the screen. Patient present with altered mental status. Patient speaking in word salad and tangential language.   Disposition: Hillery Jacks, NP, patient con't to meet inpatient criteria

## 2019-06-20 NOTE — ED Notes (Signed)
Pt in forensic restraint  Shouting to sitter, increased agitation   Order received for  med

## 2019-06-20 NOTE — ED Notes (Signed)
Pt has had an interesting day  He continues to be delusional with promises to hire nurses for his hospital   He has taken his medication upon request when they have been explained to him   He became agitated/loud/ and threatening and was given geodon im  He then was calmer and more cooperative-   He ate 100 per cent of meal and asked for and received an extra meal after his night meds  He then suggested to this RN that he would make her 23 again- she gently and politely refused the offer   He then stated he could make her not suffer upon dying. He was successfully redirected to a snack and beverage

## 2019-06-20 NOTE — ED Notes (Signed)
IVC renewed and faxed to magistrate.

## 2019-06-21 DIAGNOSIS — F319 Bipolar disorder, unspecified: Secondary | ICD-10-CM | POA: Diagnosis not present

## 2019-06-21 LAB — VALPROIC ACID LEVEL: Valproic Acid Lvl: 29 ug/mL — ABNORMAL LOW (ref 50.0–100.0)

## 2019-06-21 MED ORDER — RISPERIDONE 1 MG PO TBDP
2.0000 mg | ORAL_TABLET | Freq: Two times a day (BID) | ORAL | Status: DC
  Administered 2019-06-21 – 2019-06-24 (×6): 2 mg via ORAL
  Filled 2019-06-21 (×7): qty 2

## 2019-06-21 NOTE — Progress Notes (Signed)
Patient ID: Albert Miranda, male   DOB: 01-20-1981, 39 y.o.   MRN: [id number]   Per review of chart, patient continues to present with both delusional and agitated behavioral. I have increased his Risperdal to 2 mg po twice a day. He is currently on Depakote 250 TID although there were no results of a Valproic level. Order placed.

## 2019-06-21 NOTE — ED Provider Notes (Signed)
Emergency Medicine Observation Re-evaluation Note  Albert Miranda is a 39 y.o. male, seen on rounds today.  Pt initially presented to the ED for complaints of V70.1 Currently, the patient is awaiting BH placement. Required meds yesterday for acute agitation. BH has made med adjustments starting today. No issues this AM.   Physical Exam  BP (!) 154/81 (BP Location: Left Arm)   Pulse 93   Temp 98.5 F (36.9 C) (Oral)   Resp 16   Ht 5' 10.5" (1.791 m)   Wt 99.8 kg   SpO2 97%   BMI 31.12 kg/m  Physical Exam   Awake and alert.  Even, unlabored respirations.  No acute agitation.   ED Course / MDM  EKG:EKG Interpretation  Date/Time:  Monday June 14 2019 17:57:19 EDT Ventricular Rate:  63 PR Interval:  130 QRS Duration: 70 QT Interval:  434 QTC Calculation: 444 R Axis:   30 Text Interpretation: Normal sinus rhythm Low voltage QRS Borderline ECG Confirmed by Geoffery Lyons ([id number]) on 06/14/2019 7:42:04 PM    I have reviewed the labs performed to date as well as medications administered while in observation.  Recent changes in the last 24 hours include med adjustment per [facility]. Plan  Current plan is for continue placement search.   Maia Plan, MD 06/21/19 1420

## 2019-06-21 NOTE — ED Notes (Signed)
Pt in shower.  

## 2019-06-21 NOTE — ED Notes (Signed)
Mild edema to right ankle, officer to remove and replace on left ankle

## 2019-06-22 DIAGNOSIS — F319 Bipolar disorder, unspecified: Secondary | ICD-10-CM | POA: Diagnosis not present

## 2019-06-22 NOTE — ED Notes (Signed)
Pt given phone to call mother

## 2019-06-22 NOTE — BH Assessment (Signed)
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 He reads a list of medications listed: "dermatology cream, Ibuprofen, Eyed drops, finger nail clippers, and tub to soak my feet in". Patient then states that he needs good treatment. He would like the handcuffs removed from his legs. He feels that his legs are swelling.   Patient asked about suicidal thoughts and states, "Not right now". Patient responds the same when asked about homicidal ideations stating, "Not right now". Patient reports auditory hallucinations. States that he hears bad voices, "disrespectful childrens voices" and "sons and daughters of mine". States that he is also known as "Cocaine Buckey".  '.  Patient ask for this writers name. Then states, "You are the future [employer] and you will get a trillion dollars.  Patient states that his sleep is "decent". His appetite is fair.   Patient oriented to time, person, place, and situation. Speech is normal. Affect is appropriate. Patient with flight of ideas. Tangential thoughts. His memory is recently intact but not remotely intact. Patient dressed in scrubs.   Per Garlan Fillers, NP, patient continues to meet inpatient criteria.

## 2019-06-22 NOTE — ED Provider Notes (Signed)
Emergency Medicine Observation Re-evaluation Note  Albert Miranda is a 39 y.o. male, seen on rounds today.  Pt initially presented to the ED for complaints of V70.1 Currently, the patient is sitting on stretcher comfortably, not restrained, eating breakfast.  Cooperating with staff.  Physical Exam  BP 115/81 (BP Location: Right Arm)   Pulse 74   Temp (!) 97.5 F (36.4 C) (Oral)   Resp 16   Ht 5' 10.5" (1.791 m)   Wt 99.8 kg   SpO2 99%   BMI 31.12 kg/m  Physical Exam-alert and cooperative  ED Course / MDM  EKG:EKG Interpretation  Date/Time:  Monday June 14 2019 17:57:19 EDT Ventricular Rate:  63 PR Interval:  130 QRS Duration: 70 QT Interval:  434 QTC Calculation: 444 R Axis:   30 Text Interpretation: Normal sinus rhythm Low voltage QRS Borderline ECG Confirmed by Geoffery Lyons ([id number]) on 06/14/2019 7:42:04 PM    I have reviewed the labs performed to date as well as medications administered while in observation.  Recent changes in the last 24 hours include he has required continued restraint with leg cough.  Left ankle edema require changing the cuff to the right leg, yesterday.  Evaluation after increased risperidone, yesterday indicates he continues to have defiant behavior.  Depakote level yesterday as below therapeutic level for seizure treatment.  It is unlikely this represents a toxic level. Plan  Current plan is for observation/placement. Patient is under full IVC at this time.   Mancel Bale, MD 06/22/19 [phone]

## 2019-06-22 NOTE — ED Notes (Signed)
Pt int he shower, Security and Sheriff present.

## 2019-06-22 NOTE — ED Notes (Signed)
Pt remains in forensic restraints, right ankle currently shackled to stretcher, no signs of injury. Sheriff's Deputy remains at bedside.

## 2019-06-22 NOTE — ED Notes (Signed)
Pt would not let me do vitals

## 2019-06-23 DIAGNOSIS — F319 Bipolar disorder, unspecified: Secondary | ICD-10-CM | POA: Diagnosis not present

## 2019-06-23 NOTE — ED Notes (Signed)
Pt showering with police at side

## 2019-06-23 NOTE — ED Notes (Signed)
Pt agreed to take Depakote 250 mg and risperidone 2 mg after  shower.

## 2019-06-23 NOTE — ED Provider Notes (Signed)
Emergency Medicine Observation Re-evaluation Note  Albert Miranda is a 39 y.o. male, seen on rounds today.  Pt initially presented to the ED for complaints of V70.1 Currently, the patient is resting.  Physical Exam  BP 117/84 (BP Location: Right Arm)   Pulse 83   Temp 98.3 F (36.8 C) (Oral)   Resp 18   Ht 5' 10.5" (1.791 m)   Wt 99.8 kg   SpO2 99%   BMI 31.12 kg/m  Physical Exam  ED Course / MDM  EKG:EKG Interpretation  Date/Time:  Monday June 14 2019 17:57:19 EDT Ventricular Rate:  63 PR Interval:  130 QRS Duration: 70 QT Interval:  434 QTC Calculation: 444 R Axis:   30 Text Interpretation: Normal sinus rhythm Low voltage QRS Borderline ECG Confirmed by Geoffery Lyons ([id number]) on 06/14/2019 7:42:04 PM    I have reviewed the labs performed to date as well as medications administered while in observation.  Recent changes in the last 24 hours include medication adjustments. Plan  Current plan is for observation/bed search. Patient is under full IVC at this time.   Terrilee Files, MD 06/23/19 [phone]

## 2019-06-23 NOTE — BH Assessment (Signed)
Reassessment Note: Pt present sitting on side of his bed. He is calm and responds to questions asked. 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 Pt denies SI, HI, and AVH. He reports feelings of paranoia. Cont recommendation for inpt tx.

## 2019-06-24 DIAGNOSIS — F319 Bipolar disorder, unspecified: Secondary | ICD-10-CM | POA: Diagnosis not present

## 2019-06-24 MED ORDER — PALIPERIDONE PALMITATE ER 234 MG/1.5ML IM SUSY: 234.0000 mg | PREFILLED_SYRINGE | Freq: Once | INTRAMUSCULAR | Status: DC

## 2019-06-24 MED ORDER — KETOROLAC TROMETHAMINE 30 MG/ML IJ SOLN
60.0000 mg | Freq: Once | INTRAMUSCULAR | Status: AC
  Administered 2019-06-24: 60 mg via INTRAMUSCULAR
  Filled 2019-06-24: qty 2

## 2019-06-24 NOTE — BH Assessment (Signed)
Contacted [facility] to complete patient's re-assessment. Patient in the shower at this time. Patient's nurse will call back when patient is out of the shower and ready to be assessed.

## 2019-06-24 NOTE — Discharge Instructions (Addendum)
Follow up as instructed by behavior health 

## 2019-06-24 NOTE — ED Notes (Signed)
Pt c/o bilateral knee pain 6/10, tylenol given.

## 2019-06-24 NOTE — Progress Notes (Signed)
Pharmacy does not stock Gean Birchwood- RN informed

## 2019-06-24 NOTE — BH Assessment (Signed)
Reassessment 06/24/19:   Patient assessed by this writer and provider, Hillery Jacks, NP. Patient was calm and cooperative. Appears to be alert. He denies States that if he isn't able to return home he will get a hotel room. Patient also agreeable to follow up with his outpatient provider, [facility]. Patient alert to person, time, place, and situation. His speech is normal and comprehensible. Eye contact is good. Insight and Judgement are both fair.   Per Hillery Jacks, NP, patient is ok to discharge. Patient is psych cleared.

## 2019-06-24 NOTE — Consult Note (Signed)
  Patient to be cleared by Psychiatry and follow-up with [facility] for medications management. Case was staffed with Psychiatrist  Violeta Gelinas. Will initiate Invega Sustenna 234 mg patient and patient to  Continue Risperdal M tabs x 14 days and follow-up with outpatient.  Patient was receptive to long acting medications at discharge. Continues to deny suicidal or homicidal ideations. Patient denied auditory or visual hallucination. Patient mood appears to be improving since restarting Risperidone 2 mg BID and Depakote 250 mg  nightly.  UDS-,  Valproic Ac ide 29,-: EKG Interpretation  Date/Time:                  Monday June 14 2019 17:57:19 EDT Ventricular Rate:         63 PR Interval:                 130 QRS Duration: 70 QT Interval:                 434 QTC Calculation:        444 R Axis:                         30 Text Interpretation:      Normal sinus rhythm Low voltage QRS Borderline ECG Confirmed by Geoffery Lyons ([id number]) on 06/14/2019 7:42:04 PMG   T.Tiquan Bouch NP  [facility]

## 2019-06-24 NOTE — ED Provider Notes (Signed)
Emergency Medicine Observation Re-evaluation Note  Albert Miranda is a 39 y.o. male, seen on rounds today.  Pt initially presented to the ED for complaints of V70.1 Currently, the patient is resting.  Physical Exam  BP 120/75 (BP Location: Right Arm)   Pulse 75   Temp 98.5 F (36.9 C) (Oral)   Resp 17   Ht 1.791 m (5' 10.5")   Wt 99.8 kg   SpO2 100%   BMI 31.12 kg/m  Physical Exam deferred ED Course / MDM  EKG:EKG Interpretation  Date/Time:  Monday June 14 2019 17:57:19 EDT Ventricular Rate:  63 PR Interval:  130 QRS Duration: 70 QT Interval:  434 QTC Calculation: 444 R Axis:   30 Text Interpretation: Normal sinus rhythm Low voltage QRS Borderline ECG Confirmed by Geoffery Lyons ([id number]) on 06/14/2019 7:42:04 PM    I have reviewed the labs performed to date as well as medications administered while in observation.  No  Recent changes in the last 24 hours.  Tylenol give for knee pain Plan  Current plan is for inpatient tx. Patient is under full IVC at this time.   Linwood Dibbles, MD 06/24/19 [phone]

## 2019-07-26 ENCOUNTER — Emergency Department

## 2019-07-26 ENCOUNTER — Emergency Department
Admission: EM | Admit: 2019-07-26 | Discharge: 2019-07-26 | Disposition: A | Discharge status: Other Acute Inpatient Hospital | Attending: Emergency Medicine | Admitting: Emergency Medicine

## 2019-07-26 ENCOUNTER — Other Ambulatory Visit: Payer: Self-pay

## 2019-07-26 ENCOUNTER — Encounter: Payer: Self-pay | Admitting: Emergency Medicine

## 2019-07-26 DIAGNOSIS — S0095XA Superficial foreign body of unspecified part of head, initial encounter: Secondary | ICD-10-CM

## 2019-07-26 DIAGNOSIS — Y999 Unspecified external cause status: Secondary | ICD-10-CM | POA: Insufficient documentation

## 2019-07-26 DIAGNOSIS — Y939 Activity, unspecified: Secondary | ICD-10-CM | POA: Diagnosis not present

## 2019-07-26 DIAGNOSIS — W458XXA Other foreign body or object entering through skin, initial encounter: Secondary | ICD-10-CM | POA: Diagnosis not present

## 2019-07-26 DIAGNOSIS — S0085XA Superficial foreign body of other part of head, initial encounter: Secondary | ICD-10-CM | POA: Insufficient documentation

## 2019-07-26 DIAGNOSIS — E119 Type 2 diabetes mellitus without complications: Secondary | ICD-10-CM | POA: Diagnosis not present

## 2019-07-26 DIAGNOSIS — Z79899 Other long term (current) drug therapy: Secondary | ICD-10-CM | POA: Diagnosis not present

## 2019-07-26 DIAGNOSIS — F17210 Nicotine dependence, cigarettes, uncomplicated: Secondary | ICD-10-CM | POA: Insufficient documentation

## 2019-07-26 DIAGNOSIS — G44319 Acute post-traumatic headache, not intractable: Secondary | ICD-10-CM | POA: Diagnosis not present

## 2019-07-26 DIAGNOSIS — Y92149 Unspecified place in prison as the place of occurrence of the external cause: Secondary | ICD-10-CM | POA: Insufficient documentation

## 2019-07-26 MED ORDER — LIDOCAINE HCL (PF) 1 % IJ SOLN
5.0000 mL | Freq: Once | INTRAMUSCULAR | Status: AC
  Administered 2019-07-26: 10:00:00 5 mL
  Filled 2019-07-26: qty 5

## 2019-07-26 MED ORDER — LIDOCAINE-EPINEPHRINE 2 %-1:100000 IJ SOLN
20.0000 mL | Freq: Once | INTRAMUSCULAR | Status: AC
  Administered 2019-07-26: 20 mL via INTRADERMAL
  Filled 2019-07-26: qty 1
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
## 2019-07-26 NOTE — ED Notes (Signed)
Pt insistent on signing transfer consent with SSN, this RN informed pt that only his name was needed, not SSN. Pt insisted his SSN was part of his signature

## 2019-07-26 NOTE — ED Notes (Signed)
[employer] deputy at bedside

## 2019-07-26 NOTE — ED Notes (Signed)
Images POWERSHARED to [facility] by Pam in radiology
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
## 2019-07-26 NOTE — ED Notes (Signed)
Spoke with patient transfer to [facility] @ 1131.

## 2019-07-26 NOTE — ED Notes (Signed)
[facility] called for transfer to [facility] ED.

## 2019-07-26 NOTE — ED Notes (Signed)
Pt ambulatory to restroom at this time, steady gait, deputies with pt

## 2019-07-26 NOTE — ED Notes (Signed)
Pt awaiting transfer  Deputies remain at bedside

## 2019-07-27 NOTE — ED Provider Notes (Signed)
   [facility] Emergency Department Provider Note  PROCEDURES  Procedure(s) performed (including Critical Care):  .Foreign Body Removal  Date/Time: 07/27/2019 8:10 AM Performed by: Concha Se, MD Authorized by: Concha Se, MD  Consent: Verbal consent obtained. Consent given by: patient Intake: forehead. Anesthesia: local infiltration  Anesthesia: Local Anesthetic: lidocaine 1% with epinephrine Anesthetic total: 4 mL  Sedation: Patient sedated: no  Complexity: simple Post-procedure assessment: foreign body not removed Comments: Unable to remove after 3 attempts by PA and 3 attempts by myself      ____________________________________________    Concha Se, MD 07/27/19 [phone]

## 2019-07-28 MED ORDER — CEPHALEXIN 250 MG PO CAPS
500.00 | ORAL_CAPSULE | ORAL | Status: DC
Start: 2019-07-27 — End: 2019-07-28

## 2019-08-26 ENCOUNTER — Other Ambulatory Visit: Payer: Self-pay

## 2019-08-26 ENCOUNTER — Emergency Department
Admission: EM | Admit: 2019-08-26 | Discharge: 2019-08-26 | Attending: Emergency Medicine | Admitting: Emergency Medicine

## 2019-08-26 ENCOUNTER — Emergency Department

## 2019-08-26 ENCOUNTER — Encounter: Payer: Self-pay | Admitting: Emergency Medicine

## 2019-08-26 DIAGNOSIS — F1210 Cannabis abuse, uncomplicated: Secondary | ICD-10-CM | POA: Insufficient documentation

## 2019-08-26 DIAGNOSIS — S01511A Laceration without foreign body of lip, initial encounter: Secondary | ICD-10-CM | POA: Diagnosis not present

## 2019-08-26 DIAGNOSIS — E119 Type 2 diabetes mellitus without complications: Secondary | ICD-10-CM | POA: Insufficient documentation

## 2019-08-26 DIAGNOSIS — Y92142 Bathroom in prison as the place of occurrence of the external cause: Secondary | ICD-10-CM | POA: Diagnosis not present

## 2019-08-26 DIAGNOSIS — S0990XA Unspecified injury of head, initial encounter: Secondary | ICD-10-CM | POA: Diagnosis present

## 2019-08-26 DIAGNOSIS — Y998 Other external cause status: Secondary | ICD-10-CM | POA: Diagnosis not present

## 2019-08-26 DIAGNOSIS — Z23 Encounter for immunization: Secondary | ICD-10-CM | POA: Diagnosis not present

## 2019-08-26 DIAGNOSIS — F17210 Nicotine dependence, cigarettes, uncomplicated: Secondary | ICD-10-CM | POA: Insufficient documentation

## 2019-08-26 DIAGNOSIS — S0093XA Contusion of unspecified part of head, initial encounter: Secondary | ICD-10-CM

## 2019-08-26 DIAGNOSIS — Y93E1 Activity, personal bathing and showering: Secondary | ICD-10-CM | POA: Insufficient documentation

## 2019-08-26 MED ORDER — TETANUS-DIPHTH-ACELL PERTUSSIS 5-2.5-18.5 LF-MCG/0.5 IM SUSP
0.5000 mL | Freq: Once | INTRAMUSCULAR | Status: AC
  Administered 2019-08-26: 0.5 mL via INTRAMUSCULAR
  Filled 2019-08-26: qty 0.5

## 2019-08-26 MED ORDER — LIDOCAINE-EPINEPHRINE-TETRACAINE (LET) TOPICAL GEL
3.0000 mL | Freq: Once | TOPICAL | Status: AC
  Administered 2019-08-26: 3 mL via TOPICAL
  Filled 2019-08-26: qty 3

## 2019-08-26 MED ORDER — LIDOCAINE HCL (PF) 1 % IJ SOLN
5.0000 mL | Freq: Once | INTRAMUSCULAR | Status: AC
  Administered 2019-08-26: 5 mL
  Filled 2019-08-26: qty 5

## 2019-08-26 MED ORDER — BACITRACIN-NEOMYCIN-POLYMYXIN 400-5-5000 EX OINT
TOPICAL_OINTMENT | Freq: Once | CUTANEOUS | Status: AC
  Administered 2019-08-26: 1 via TOPICAL
  Filled 2019-08-26: qty 1

## 2019-08-26 MED ORDER — AMOXICILLIN 500 MG PO CAPS
500.0000 mg | ORAL_CAPSULE | Freq: Once | ORAL | Status: AC
  Administered 2019-08-26: 500 mg via ORAL
  Filled 2019-08-26: qty 1

## 2019-08-26 NOTE — Discharge Instructions (Addendum)
Patient has absorbable sutures placed in the lips today will not need to be removed.  However he does need antibiotics.  I would advise amoxicillin 500 mg 3 times daily for 7 days.  Return to the emergency department if any worsening infection.  His Tdap was updated here in the ED.
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
## 2020-05-31 ENCOUNTER — Emergency Department: Payer: Medicare Other

## 2020-05-31 ENCOUNTER — Emergency Department
Admission: EM | Admit: 2020-05-31 | Discharge: 2020-05-31 | Disposition: A | Discharge status: Home / Self Care | Payer: Medicare Other | Attending: Emergency Medicine | Admitting: Emergency Medicine

## 2020-05-31 ENCOUNTER — Other Ambulatory Visit: Payer: Self-pay

## 2020-05-31 ENCOUNTER — Encounter: Payer: Self-pay | Admitting: Emergency Medicine

## 2020-05-31 DIAGNOSIS — M7989 Other specified soft tissue disorders: Secondary | ICD-10-CM

## 2020-05-31 DIAGNOSIS — R0789 Other chest pain: Secondary | ICD-10-CM | POA: Insufficient documentation

## 2020-05-31 DIAGNOSIS — E119 Type 2 diabetes mellitus without complications: Secondary | ICD-10-CM | POA: Diagnosis not present

## 2020-05-31 DIAGNOSIS — F17210 Nicotine dependence, cigarettes, uncomplicated: Secondary | ICD-10-CM | POA: Insufficient documentation

## 2020-05-31 LAB — COMPREHENSIVE METABOLIC PANEL
ALT: 11 U/L (ref 0–44)
AST: 17 U/L (ref 15–41)
Albumin: 3.8 g/dL (ref 3.5–5.0)
Alkaline Phosphatase: 57 U/L (ref 38–126)
Anion gap: 8 (ref 5–15)
BUN: 14 mg/dL (ref 6–20)
CO2: 27 mmol/L (ref 22–32)
Calcium: 8.8 mg/dL — ABNORMAL LOW (ref 8.9–10.3)
Chloride: 105 mmol/L (ref 98–111)
Creatinine, Ser: 1.05 mg/dL (ref 0.61–1.24)
GFR, Estimated: >60 mL/min (ref >60)
Glucose, Bld: 132 mg/dL — ABNORMAL HIGH (ref 70–99)
Potassium: 4.1 mmol/L (ref 3.5–5.1)
Sodium: 140 mmol/L (ref 135–145)
Total Bilirubin: 0.7 mg/dL (ref 0.3–1.2)
Total Protein: 7.3 g/dL (ref 6.5–8.1)

## 2020-05-31 LAB — CBC
HCT: 44.3 % (ref 39.0–52.0)
Hemoglobin: 14.9 g/dL (ref 13.0–17.0)
MCH: 30.8 pg (ref 26.0–34.0)
MCHC: 33.6 g/dL (ref 30.0–36.0)
MCV: 91.7 fL (ref 80.0–100.0)
Platelets: 251 10*3/uL (ref 150–400)
RBC: 4.83 MIL/uL (ref 4.22–5.81)
RDW: 13.7 % (ref 11.5–15.5)
WBC: 6.1 10*3/uL (ref 4.0–10.5)
nRBC: 0 % (ref 0.0–0.2)

## 2020-05-31 MED ORDER — IOHEXOL 350 MG/ML SOLN
75.0000 mL | Freq: Once | INTRAVENOUS | Status: AC | PRN
  Administered 2020-05-31: 75 mL via INTRAVENOUS

## 2020-05-31 NOTE — ED Notes (Signed)
AAOx3.  Skin warm and dry.  NAD 

## 2020-05-31 NOTE — ED Notes (Signed)
Patient to and from CT.
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
## 2020-05-31 NOTE — ED Notes (Signed)
Patient on phone with lawyer, refusing blood draw at this time.

## 2020-05-31 NOTE — ED Provider Notes (Signed)
[facility] Emergency Department Provider Note   ____________________________________________    I have reviewed the triage vital signs and the nursing notes.   HISTORY  Chief Complaint Leg Swelling     HPI Albert Miranda is a 40 y.o. male with history of diabetes, PE/DVT who presents with complaints of left lower leg swelling.  Patient reports this started over the last couple of days.  He denies significant pain.  No shortness of breath reported.  No chest pain at this time.  Is not on blood thinners.  No fevers chills or cough.  Has not take anything for this.  No new medications.  Past Medical History:  Diagnosis Date  . Bipolar 1 disorder (HCC)   . Diabetes mellitus without complication (HCC)    A1C 10.1 01/2017   . Herpes   . Hyperglycemia   . Patient denies medical problems     Patient Active Problem List   Diagnosis Date Noted  . Acute pulmonary embolism (HCC) 01/19/2019  . Herpes 02/20/2017  . Penile lesion 02/09/2017  . Type 2 diabetes mellitus with hyperglycemia, without long-term current use of insulin (HCC) 02/09/2017  . Elevated blood sugar 10/23/2016  . Schizoaffective disorder (HCC) 05/01/2016  . Cannabis abuse 05/01/2016  . Noncompliance 05/01/2016    Past Surgical History:  Procedure Laterality Date  . INCISION AND DRAINAGE ABSCESS     buttock gluteal region   . LEG SURGERY    . PULMONARY THROMBECTOMY N/A 01/20/2019   Procedure: PULMONARY THROMBECTOMY, LEFT LOWER EXTREMITY VENOUS LYSIS, IVF FILTER INSERTION;  Surgeon: Annice Needy, MD;  Location: [facility] INVASIVE CV LAB;  Service: Cardiovascular;  Laterality: N/A;    Prior to Admission medications   Medication Sig Start Date End Date Taking? Authorizing Provider  bismuth subsalicylate (PEPTO-BISMOL) 262 MG/15ML suspension Take 30 mLs by mouth every 6 (six) hours as needed for indigestion (stomach ache).    [provider]  ibuprofen (ADVIL) 200 MG tablet Take  200 mg by mouth every 6 (six) hours as needed for headache.    [provider]  ibuprofen (ADVIL) 800 MG tablet Take 800 mg by mouth daily as needed for headache.    [provider]  Tetrahyd-Glyc-Hypro-PEG-ZnSulf (VISINE TOTALITY MULTI-SYMPTOM) 0.05 % SOLN Apply 1 drop to eye in the morning and at bedtime. Both eyes    [provider]  Tetrahydrozoline-PEG-Zinc Sulf (VISINE RED EYE TOTAL COMFORT) 0.05-1-0.25 % SOLN Apply 1 drop to eye in the morning and at bedtime. Both eyes    [provider]     Allergies Patient has no known allergies.  Family History  Problem Relation Age of Onset  . Diabetes Father        dx age 63   . Depression Mother     Social History Social History   Tobacco Use  . Smoking status: Current Every Day Smoker    Packs/day: 1.00    Types: Cigarettes  . Smokeless tobacco: Never Used  . Tobacco comment: 1-1.5 ppd since since age 66 as of 02/07/17 does not want to quit  Vaping Use  . Vaping Use: Never used  Substance Use Topics  . Alcohol use: Yes    Comment: occassional  . Drug use: Yes    Types: Marijuana    Comment: 1-2 x per month     Review of Systems  Constitutional: No fever/chills Eyes: No visual changes.  ENT: No sore throat. Cardiovascular: As above Respiratory: As above Gastrointestinal:  No abdominal pain.  No nausea, no vomiting.   Genitourinary: Negative for dysuria. Musculoskeletal: As above Skin: Negative for rash. Neurological: Negative for headaches or weakness   ____________________________________________   PHYSICAL EXAM:  VITAL SIGNS: ED Triage Vitals  Enc Vitals Group     BP 05/31/20 1229 (!) 141/96     Pulse Rate 05/31/20 1229 85     Resp 05/31/20 1229 17     Temp 05/31/20 1229 98.2 F (36.8 C)     Temp Source 05/31/20 1229 Oral     SpO2 05/31/20 1229 99 %     Weight 05/31/20 1230 93 kg (205 lb)     Height 05/31/20 1230 1.816 m (5' 11.5")     Head Circumference --      Peak  Flow --      Pain Score 05/31/20 1233 0     Pain Loc --      Pain Edu? --      Excl. in GC? --     Constitutional: Alert and oriented.  E Nose: No congestion/rhinnorhea. Mouth/Throat: Mucous membranes are moist.   Neck:  Painless ROM Cardiovascular: Normal rate, regular rhythm. Grossly normal heart sounds.  Good peripheral circulation. Respiratory: Normal respiratory effort.  No retractions. Lungs CTAB. Gastrointestinal: Soft and nontender. No distention.  No CVA tenderness. Genitourinary: deferred Musculoskeletal: Mild left lower leg swelling, warm and well perfused, no calf tenderness Neurologic:  Normal speech and language. No gross focal neurologic deficits are appreciated.  Skin:  Skin is warm, dry and intact. No rash noted. Psychiatric: Mood and affect are normal. Speech and behavior are normal.  ____________________________________________   LABS (all labs ordered are listed, but only abnormal results are displayed)  Labs Reviewed  COMPREHENSIVE METABOLIC PANEL - Abnormal; Notable for the following components:      Result Value   Glucose, Bld 132 (*)    Calcium 8.8 (*)    All other components within normal limits  CBC   ____________________________________________  EKG  None ____________________________________________  RADIOLOGY  Ultrasound left leg ____________________________________________   PROCEDURES  Procedure(s) performed: No  Procedures   Critical Care performed: No ____________________________________________   INITIAL IMPRESSION / ASSESSMENT AND PLAN / ED COURSE  Pertinent labs & imaging results that were available during my care of the patient were reviewed by me and considered in my medical decision making (see chart for details).  Patient with a history of DVT in the past presents with left lower leg swelling, no significant pain, no shortness of breath or pleurisy but does describe some mild left-sided chest discomfort.  Vital  signs reassuring, minimal swelling noted on exam.  No erythema to suggest infection, warm and well-perfused  We will send for ultrasound to evaluate for DVT and CT angiography to evaluate for PE given his history  CT angiography negative for PE  Ultrasound negative for acute PE, some wall thickening noted on ultrasound, possible chronic DVT, unclear, outpatient follow-up with vascular recommended    ____________________________________________   FINAL CLINICAL IMPRESSION(S) / ED DIAGNOSES  Final diagnoses:  Leg swelling        Note:  This document was prepared using Dragon voice recognition software and may include unintentional dictation errors.   Jene Every, MD 05/31/20 [phone]

## 2020-07-16 ENCOUNTER — Emergency Department: Payer: Medicare Other

## 2020-07-16 ENCOUNTER — Other Ambulatory Visit: Payer: Self-pay

## 2020-07-16 ENCOUNTER — Encounter: Payer: Self-pay | Admitting: Emergency Medicine

## 2020-07-16 ENCOUNTER — Emergency Department
Admission: EM | Admit: 2020-07-16 | Discharge: 2020-07-17 | Disposition: A | Discharge status: Home / Self Care | Payer: Medicare Other | Attending: Emergency Medicine | Admitting: Emergency Medicine

## 2020-07-16 DIAGNOSIS — E119 Type 2 diabetes mellitus without complications: Secondary | ICD-10-CM | POA: Diagnosis not present

## 2020-07-16 DIAGNOSIS — F29 Unspecified psychosis not due to a substance or known physiological condition: Secondary | ICD-10-CM

## 2020-07-16 DIAGNOSIS — Z79899 Other long term (current) drug therapy: Secondary | ICD-10-CM | POA: Diagnosis not present

## 2020-07-16 DIAGNOSIS — F250 Schizoaffective disorder, bipolar type: Secondary | ICD-10-CM | POA: Diagnosis not present

## 2020-07-16 DIAGNOSIS — Z9114 Patient's other noncompliance with medication regimen: Secondary | ICD-10-CM | POA: Diagnosis not present

## 2020-07-16 DIAGNOSIS — R462 Strange and inexplicable behavior: Secondary | ICD-10-CM | POA: Diagnosis present

## 2020-07-16 DIAGNOSIS — R0789 Other chest pain: Secondary | ICD-10-CM | POA: Insufficient documentation

## 2020-07-16 DIAGNOSIS — R079 Chest pain, unspecified: Secondary | ICD-10-CM

## 2020-07-16 DIAGNOSIS — F17210 Nicotine dependence, cigarettes, uncomplicated: Secondary | ICD-10-CM | POA: Diagnosis not present

## 2020-07-16 LAB — CBC
HCT: 47.9 % (ref 39.0–52.0)
Hemoglobin: 16.4 g/dL (ref 13.0–17.0)
MCH: 31.2 pg (ref 26.0–34.0)
MCHC: 34.2 g/dL (ref 30.0–36.0)
MCV: 91.1 fL (ref 80.0–100.0)
Platelets: 244 10*3/uL (ref 150–400)
RBC: 5.26 MIL/uL (ref 4.22–5.81)
RDW: 13.8 % (ref 11.5–15.5)
WBC: 8.5 10*3/uL (ref 4.0–10.5)
nRBC: 0 % (ref 0.0–0.2)

## 2020-07-16 LAB — BASIC METABOLIC PANEL
Anion gap: 10 (ref 5–15)
BUN: 22 mg/dL — ABNORMAL HIGH (ref 6–20)
CO2: 25 mmol/L (ref 22–32)
Calcium: 9 mg/dL (ref 8.9–10.3)
Chloride: 103 mmol/L (ref 98–111)
Creatinine, Ser: 1.3 mg/dL — ABNORMAL HIGH (ref 0.61–1.24)
GFR, Estimated: >60 mL/min (ref >60)
Glucose, Bld: 165 mg/dL — ABNORMAL HIGH (ref 70–99)
Potassium: 3.9 mmol/L (ref 3.5–5.1)
Sodium: 138 mmol/L (ref 135–145)

## 2020-07-16 LAB — TROPONIN I (HIGH SENSITIVITY): Troponin I (High Sensitivity): 9 ng/L (ref <18)

## 2020-07-16 MED ORDER — ACETAMINOPHEN 500 MG PO TABS
1000.0000 mg | ORAL_TABLET | Freq: Once | ORAL | Status: AC
  Administered 2020-07-16: 1000 mg via ORAL
  Filled 2020-07-16: qty 2

## 2020-07-16 MED ORDER — SODIUM CHLORIDE 0.9 % IV BOLUS (SEPSIS)
1000.0000 mL | Freq: Once | INTRAVENOUS | Status: AC
  Administered 2020-07-16: 1000 mL via INTRAVENOUS

## 2020-07-16 MED ORDER — KETOROLAC TROMETHAMINE 30 MG/ML IJ SOLN
60.0000 mg | Freq: Once | INTRAMUSCULAR | Status: DC
  Filled 2020-07-16: qty 2

## 2020-07-16 NOTE — ED Provider Notes (Signed)
[facility] Emergency Department Provider Note  ____________________________________________   Event Date/Time   First MD Initiated Contact with Patient 07/16/20 2255     (approximate)  I have reviewed the triage vital signs and the nursing notes.   HISTORY  Chief Complaint Chest Pain    HPI Albert Miranda is a 40 y.o. male with history of schizoaffective disorder, diabetes, PE/DVT, medical noncompliance who presents to the emergency department with complaints of chest pain.  Describes it as a left-sided chest tightness that started at some point today while walking.  He states that he has been walking all day around [place].  States he was living in a hotel but does not have anywhere to stay tonight.  He denies associated shortness of breath, nausea, vomiting, diaphoresis, dizziness, fevers or cough.  No lower extremity swelling or pain.  Patient has a very bizarre affect and is not answering questions appropriately.  Does not make eye contact and has long pauses between answers.  States that he is getting married soon and I am on a "walking test".  He denies SI, HI or hallucinations.  Reports occasional alcohol and marijuana use.     Also states that he needs his skin evaluated.  Reports being outside all day today and feels like his skin is burning on his arms.   He also states that he thinks he is dehydrated stating that he has not had much to eat or drink today.  No vomiting or diarrhea.    Past Medical History:  Diagnosis Date  . Bipolar 1 disorder (HCC)   . Diabetes mellitus without complication (HCC)    A1C 10.1 01/2017   . Herpes   . Hyperglycemia   . Patient denies medical problems     Patient Active Problem List   Diagnosis Date Noted  . Acute pulmonary embolism (HCC) 01/19/2019  . Herpes 02/20/2017  . Penile lesion 02/09/2017  . Type 2 diabetes mellitus with hyperglycemia, without long-term current use of insulin (HCC) 02/09/2017  .  Elevated blood sugar 10/23/2016  . Schizoaffective disorder (HCC) 05/01/2016  . Cannabis abuse 05/01/2016  . Noncompliance 05/01/2016    Past Surgical History:  Procedure Laterality Date  . INCISION AND DRAINAGE ABSCESS     buttock gluteal region   . LEG SURGERY    . PULMONARY THROMBECTOMY N/A 01/20/2019   Procedure: PULMONARY THROMBECTOMY, LEFT LOWER EXTREMITY VENOUS LYSIS, IVF FILTER INSERTION;  Surgeon: Annice Needy, MD;  Location: [facility] INVASIVE CV LAB;  Service: Cardiovascular;  Laterality: N/A;    Prior to Admission medications   Medication Sig Start Date End Date Taking? Authorizing Provider  bismuth subsalicylate (PEPTO-BISMOL) 262 MG/15ML suspension Take 30 mLs by mouth every 6 (six) hours as needed for indigestion (stomach ache).    [provider]  ibuprofen (ADVIL) 200 MG tablet Take 200 mg by mouth every 6 (six) hours as needed for headache.    [provider]  ibuprofen (ADVIL) 800 MG tablet Take 800 mg by mouth daily as needed for headache.    [provider]  Tetrahyd-Glyc-Hypro-PEG-ZnSulf (VISINE TOTALITY MULTI-SYMPTOM) 0.05 % SOLN Apply 1 drop to eye in the morning and at bedtime. Both eyes    [provider]  Tetrahydrozoline-PEG-Zinc Sulf (VISINE RED EYE TOTAL COMFORT) 0.05-1-0.25 % SOLN Apply 1 drop to eye in the morning and at bedtime. Both eyes    [provider]    Allergies Patient has no known allergies.  Family History  Problem  Relation Age of Onset  . Diabetes Father        dx age 40   . Depression Mother     Social History Social History   Tobacco Use  . Smoking status: Current Every Day Smoker    Packs/day: 1.00    Types: Cigarettes  . Smokeless tobacco: Never Used  . Tobacco comment: 1-1.5 ppd since since age 40 as of 02/07/17 does not want to quit  Vaping Use  . Vaping Use: Never used  Substance Use Topics  . Alcohol use: Yes    Comment: occassional  . Drug use: Yes    Types: Marijuana     Comment: 1-2 x per month     Review of Systems Constitutional: No fever. Eyes: No visual changes. ENT: No sore throat. Cardiovascular: + chest pain. Respiratory: Denies shortness of breath. Gastrointestinal: No nausea, vomiting, diarrhea. Genitourinary: Negative for dysuria. Musculoskeletal: Negative for back pain. Skin: Negative for rash. Neurological: Negative for focal weakness or numbness.  ____________________________________________   PHYSICAL EXAM:  VITAL SIGNS: ED Triage Vitals  Enc Vitals Group     BP 07/16/20 2223 (!) 138/92     Pulse Rate 07/16/20 2223 97     Resp 07/16/20 2223 20     Temp 07/16/20 2223 98.8 F (37.1 C)     Temp Source 07/16/20 2223 Oral     SpO2 07/16/20 2223 97 %     Weight 07/16/20 2219 205 lb (93 kg)     Height 07/16/20 2219 5' 11.5" (1.816 m)     Head Circumference --      Peak Flow --      Pain Score 07/16/20 2218 10     Pain Loc --      Pain Edu? --      Excl. in GC? --    CONSTITUTIONAL: Alert and oriented and responds appropriately to questions intermittently. Well-appearing; well-nourished HEAD: Normocephalic EYES: Conjunctivae clear, pupils appear equal, EOM appear intact ENT: normal nose; moist mucous membranes NECK: Supple, normal ROM CARD: RRR; S1 and S2 appreciated; no murmurs, no clicks, no rubs, no gallops RESP: Normal chest excursion without splinting or tachypnea; breath sounds clear and equal bilaterally; no wheezes, no rhonchi, no rales, no hypoxia or respiratory distress, speaking full sentences ABD/GI: Normal bowel sounds; non-distended; soft, non-tender, no rebound, no guarding, no peritoneal signs, no hepatosplenomegaly BACK: The back appears normal EXT: Normal ROM in all joints; no deformity noted, no edema; no cyanosis, no calf tenderness or calf swelling SKIN: Normal color for age and race; warm; no rash on exposed skin; mild sunburn without blisters noted to bilateral forearms and dorsal hands NEURO: Moves all  extremities equally PSYCH: Bizarre affect.  Poor eye contact.  Does not appear to be responding to internal stimuli.  No SI or HI.  ____________________________________________   LABS (all labs ordered are listed, but only abnormal results are displayed)  Labs Reviewed  BASIC METABOLIC PANEL - Abnormal; Notable for the following components:      Result Value   Glucose, Bld 165 (*)    BUN 22 (*)    Creatinine, Ser 1.30 (*)    All other components within normal limits  CK - Abnormal; Notable for the following components:   Total CK 981 (*)    All other components within normal limits  CBC  D-DIMER, QUANTITATIVE (NOT AT [facility])  ACETAMINOPHEN LEVEL  SALICYLATE LEVEL  ETHANOL  URINE DRUG SCREEN, QUALITATIVE ([facility] ONLY)  TROPONIN I (HIGH SENSITIVITY)  TROPONIN I (HIGH SENSITIVITY)   ____________________________________________  EKG   Date: 07/16/2020  Rate: 107  Rhythm: Sinus tachycardia  QRS Axis: normal  Intervals: normal  ST/T Wave abnormalities: normal  Conduction Disutrbances: none  Narrative Interpretation: unremarkable, no acute change compared to previous     ____________________________________________  RADIOLOGY I, Chaos Carlile, personally viewed and evaluated these images (plain radiographs) as part of my medical decision making, as well as reviewing the written report by the radiologist.  ED MD interpretation: Chest x-ray clear  Official radiology report(s): DG Chest 2 View  Result Date: 07/16/2020 CLINICAL DATA:  Chest pain beginning today. Midsternal pain radiating down the arm. EXAM: CHEST - 2 VIEW COMPARISON:  05/28/2015 FINDINGS: Shallow inspiration. Heart size and pulmonary vascularity are normal. Lungs are clear. No pleural effusions. No pneumothorax. Mediastinal contours appear intact. An inferior vena caval filter is demonstrated in the upper abdomen. IMPRESSION: No active cardiopulmonary disease. Electronically Signed   By: Burman Nieves M.D.    On: 07/16/2020 22:52    ____________________________________________   PROCEDURES  Procedure(s) performed (including Critical Care):  Procedures    ____________________________________________   INITIAL IMPRESSION / ASSESSMENT AND PLAN / ED COURSE  As part of my medical decision making, I reviewed the following data within the electronic MEDICAL RECORD NUMBER Nursing notes reviewed and incorporated, Labs reviewed , EKG interpreted , Old EKG reviewed, Old chart reviewed, Radiograph reviewed  and Notes from prior ED visits         Patient here with complaints of chest pain.  Very difficult to get a history out of the patient due to his history of mental illness.  He does not appear to be responding to internal stimuli and denies SI, HI at this time.  I do not feel he needs emergent psychiatric evaluation as this is likely his baseline.  There is no family at bedside.  EKG today is nonischemic and first troponin is normal.  Plan to repeat second troponin although low suspicion for ACS.  Chest x-ray clear.  No signs of pneumonia, edema, pneumothorax.  He does have history of PE or DVT and medication noncompliance.  Will obtain D-dimer today.  CTA of his chest was obtained last on 05/31/2020 and showed no acute PE and previous pulmonary emboli had resolved.  He also reports that he thinks he is dehydrated.  Creatinine minimally elevated today.  Will give IV fluids and encourage oral fluids.  Will give Tylenol for his pain.  ED PROGRESS  2:00 AM  Patient appeared to be climbing from a psychiatric standpoint.  Patient began responding to external stimuli and was manic appearing.  Placed under involuntary commitment as he was trying to leave the emergency department.  [name], psychiatry consulted.  Repeat troponin pending.  Given Geodon to help with agitation.   2:30 AM  Pt's repeat troponin negative.    I reviewed all nursing notes and pertinent previous records as available.  I have reviewed  and interpreted any EKGs, lab and urine results, imaging (as available).  __________________________________   FINAL CLINICAL IMPRESSION(S) / ED DIAGNOSES  Final diagnoses:  Nonspecific chest pain  Psychosis, unspecified psychosis type [facility])     ED Discharge Orders    None      *Please note:  Albert Miranda was evaluated in Emergency Department on 07/17/2020 for the symptoms described in the history of present illness. He was evaluated in the context of the global COVID-19 pandemic, which necessitated consideration that the patient might be  at risk for infection with the SARS-CoV-2 virus that causes COVID-19. Institutional protocols and algorithms that pertain to the evaluation of patients at risk for COVID-19 are in a state of rapid change based on information released by regulatory bodies including the CDC and federal and state organizations. These policies and algorithms were followed during the patient's care in the ED.  Some ED evaluations and interventions may be delayed as a result of limited staffing during and the pandemic.*   Note:  This document was prepared using Dragon voice recognition software and may include unintentional dictation errors.   Dianara Smullen, Layla Maw, DO 07/17/20 [phone]

## 2020-07-16 NOTE — ED Triage Notes (Signed)
Pt reports that he is having chest pain that began today. He states that it is mid sternal and radiates down his arm. He does take a pause between each question and answers only with one word responses.

## 2020-07-17 ENCOUNTER — Emergency Department: Payer: Medicare Other

## 2020-07-17 DIAGNOSIS — F250 Schizoaffective disorder, bipolar type: Secondary | ICD-10-CM | POA: Diagnosis not present

## 2020-07-17 LAB — SALICYLATE LEVEL: Salicylate Lvl: <7 mg/dL — ABNORMAL LOW (ref 7.0–30.0)

## 2020-07-17 LAB — URINE DRUG SCREEN, QUALITATIVE (ARMC ONLY)
Amphetamines, Ur Screen: NOT DETECTED
Barbiturates, Ur Screen: NOT DETECTED
Benzodiazepine, Ur Scrn: NOT DETECTED
Cannabinoid 50 Ng, Ur ~~LOC~~: POSITIVE — AB
Cocaine Metabolite,Ur ~~LOC~~: NOT DETECTED
MDMA (Ecstasy)Ur Screen: NOT DETECTED
Methadone Scn, Ur: NOT DETECTED
Opiate, Ur Screen: NOT DETECTED
Phencyclidine (PCP) Ur S: NOT DETECTED
Tricyclic, Ur Screen: NOT DETECTED

## 2020-07-17 LAB — D-DIMER, QUANTITATIVE: D-Dimer, Quant: 0.37 ug/mL-FEU (ref 0.00–0.50)

## 2020-07-17 LAB — ACETAMINOPHEN LEVEL: Acetaminophen (Tylenol), Serum: <10 ug/mL — ABNORMAL LOW (ref 10–30)

## 2020-07-17 LAB — ETHANOL: Alcohol, Ethyl (B): <10 mg/dL (ref <10)

## 2020-07-17 LAB — TROPONIN I (HIGH SENSITIVITY): Troponin I (High Sensitivity): 10 ng/L (ref <18)

## 2020-07-17 LAB — CK: Total CK: 981 U/L — ABNORMAL HIGH (ref 49–397)

## 2020-07-17 MED ORDER — SODIUM CHLORIDE 0.9 % IV BOLUS (SEPSIS): 1000.0000 mL | Freq: Once | INTRAVENOUS | Status: DC

## 2020-07-17 MED ORDER — ARIPIPRAZOLE 10 MG PO TABS
10.0000 mg | ORAL_TABLET | Freq: Every day | ORAL | Status: DC
  Filled 2020-07-17: qty 1

## 2020-07-17 MED ORDER — ARIPIPRAZOLE 10 MG PO TABS: 10.0000 mg | ORAL_TABLET | Freq: Every day | ORAL | 0 refills | Status: DC

## 2020-07-17 MED ORDER — OLANZAPINE 5 MG PO TABS
5.0000 mg | ORAL_TABLET | Freq: Every day | ORAL | 0 refills | Status: DC
Start: 2020-07-17 — End: 2021-01-04

## 2020-07-17 MED ORDER — OLANZAPINE 5 MG PO TABS: 5.0000 mg | ORAL_TABLET | Freq: Every day | ORAL | Status: DC

## 2020-07-17 MED ORDER — ZIPRASIDONE MESYLATE 20 MG IM SOLR
20.0000 mg | Freq: Once | INTRAMUSCULAR | Status: AC
  Administered 2020-07-17: 20 mg via INTRAMUSCULAR
  Filled 2020-07-17: qty 20

## 2020-07-17 NOTE — ED Provider Notes (Signed)
-----------------------------------------   1:01 AM on 07/17/2020 -----------------------------------------  Asked to urgently evaluate patient while Dr. Elesa Massed is sterile performing sutures.  Patient laughing inappropriately as if responding to internal stimuli.  History of schizoaffective disorder who reportedly has had a very bizarre affect while in the emergency department.  Currently is trying to leave the premises.  Bedside consult with Dr. Elesa Massed who does feel patient requires IVC for his safety.  Patient is unable to be verbally redirected and currently requires IM calming agent.   ----------------------------------------- 2:18 AM on 07/17/2020 -----------------------------------------  Patient more calm and cooperative after IM Geodon.  He insisted on walking around, stumbled and fell, biting his lower lip.  [employer] witnessed the fall and states patient did not lose consciousness.  Will obtain CT head out of an abundance of caution.   ----------------------------------------- 4:52 AM on 07/17/2020 -----------------------------------------  CT head unremarkable.  Patient currently resting in no acute distress. The patient has been placed in psychiatric observation due to the need to provide a safe environment for the patient while obtaining psychiatric consultation and evaluation, as well as ongoing medical and medication management to treat the patient's condition.  The patient has been placed under full IVC at this time.    Irean Hong, MD 07/17/20 [phone]

## 2020-07-17 NOTE — ED Notes (Signed)
Psychiatrist at bedside

## 2020-07-17 NOTE — ED Notes (Signed)
Patient to CT via wheelchair.

## 2020-07-17 NOTE — ED Notes (Signed)
Pt received belongings at this time. Pt ambulatory with steady gait. Denies any questions regarding d/c instructions.

## 2020-07-17 NOTE — ED Notes (Signed)
Notified by [employer] that he had heard a loud noise from patient's room and when he went in patient was on the floor.  In to assess patient, patient reports he had stood up and then he fell down.  Patient instructed to lay down while doctor was notified.  Dr. Lorene Dy notified and in to see patient.

## 2020-07-17 NOTE — ED Notes (Addendum)
Late entry -- Upon entering room and making contact with patient after pt was brought from triage this nurse observed pt not making eye contact with flat affect; continues to respond slowly to questions and gives short/brief responses.  The most patient volunteered when making contact was "I'm dehydrated".  Pt showing to have rhabdo and was encouraged PO fluids and started on 1L bolus-- pt able to tolerate PO fluids without c/o increasing pain or c/o nausea; when 1L bolus ordered this nurse entered room to being 2nd 1L bolus and to collect repeat trop pt was standing up next to IV Pole -- pt extended RUE and pointed at IV and stated "take this thing out" and "I'm leaving" -- when pt asked why he wanted to leave pt stated "I'm ready to go" and stated he was no longer dehydrated -- attempted to educate patient on the fact that he has rhabdo and risk of kidney damage without additional fluids-- pt continued to insist that IV be removed -- this nurse complied with pt request and d/c'd IV.  This nurse then notified Dr Elesa Massed who was in middle of procedure- per Dr Elesa Massed pt not safe to leave without additional physician eval -- per Dr Elesa Massed this nurse to notify Dr Wynelle Link -- charge nurse Erie Noe notified and has notified Dr Wynelle Link- after eval pt to be IVC.  Pt did leave room and was trying to find exit out of bldg however charge nurse able to direct pt back to room-- when confirmed that pt to be IVC'd security called to room.  Explained to pt the IVC process and the need for psych consult and that he couldn't leave -- pt complied with request to remain in room and then to change into burgundy scrubs.  Pt then transferred to room 20.  Report to [place], [place]

## 2020-07-17 NOTE — Progress Notes (Signed)
Pt under review at [facility] for appropriate bed.  Penni Homans, MSW, LCSW 07/17/2020 10:09 AM

## 2020-07-17 NOTE — [facility] Suicide Risk Assessment (Signed)
[facility] Admission Suicide Risk Assessment   Nursing information obtained from:    Demographic factors:    Current Mental Status:    Loss Factors:    Historical Factors:    Risk Reduction Factors:     Total Time spent with patient: 1 hour Principal Problem: <principal problem not specified> Diagnosis:  Schizoaffective disorder, bipolar type Medication noncompliance Treatment Plan Summary: Daily contact with patient to assess and evaluate symptoms and progress in treatment  Restart home medications of Start Abilify 10 mg p.o. daily Start Zyprexa 5 mg p.o. nightly Will revert IVC.  Patient has stabilized due to getting adequate sleep and IM Geodon 20 mg.  No SI or HI.  Not actively psychotic or manic.  Contracts for safety Patient states that he has money and will be residing in a hotel Unable to provide collateral information from [place], his stepfather as he does not recall the number    HPI:   Mr. Gigante is a African American male with a longstanding history of schizoaffective disorder bipolar type and medication noncompliance.  He presented to the emergency department on April 24 complaining of chest pain.  Upon evaluation by the emergency room physician, he was noted to actively hallucinating with very bizarre affect, at times not answering questions.  Stated that he was getting married and that he is on a "walking test."  He was placed on an involuntary commitment but then tried to leave and so was given IM Geodon this morning with good benefit. Patient reports that he has been doing well.    He denies suicidal or homicidal ideations. No active psychosis or mania.  He is alert and oriented x3.   He is currently calm and cooperative, eating his meal inside of his room.  Pt is logical and linear. No signs of active Denies any stressors.  Says that he has been living in a hotel for the past 1 month and previous to this, he was living with his stepfather Kathlene November.  Currently does not have  his number.  Acknowledges a history of bipolar disorder but says that he is not supposed to be on any medications.  Denies suicidal or homicidal ideations.  Patient has not been violent during his visit thus far.  Records indicate that he has done well on Zyprexa and Abilify in the past.   Continued Clinical Symptoms:    The "Alcohol Use Disorders Identification Test", Guidelines for Use in Primary Care, Second Edition.  World Science writer [facility]). Score between 0-7:  no or low risk or alcohol related problems. Score between 8-15:  moderate risk of alcohol related problems. Score between 16-19:  high risk of alcohol related problems. Score 20 or above:  warrants further diagnostic evaluation for alcohol dependence and treatment.   CLINICAL FACTORS:   Schizophrenia:   Paranoid or undifferentiated type  Psychiatric Specialty Exam:  Presentation General Appearance:Appropriate for Environment  Eye Contact:good  Speech:; Normal Rate  Speech Volume:Normal  Handedness:Right   Mood and Affect Mood:fine  Affect:neutral  Thought Process Thought Processes:No data recorded Descriptions of Associations:Intact  Orientation:Full (Time, Place and Person)  Thought Content:Obsessions   History of Schizophrenia/Schizoaffective disorder:No data recorded Duration of Psychotic Symptoms:No data recorded Hallucinations:Hallucinations: None  Ideas of Reference:None  Suicidal Thoughts:Suicidal Thoughts: No  Homicidal Thoughts:Homicidal Thoughts: No   Sensorium Memory:Immediate Good; Recent Good; Remote Good  Judgment:Intact  Insight:Poor  Executive Functions Concentration:Fair  Attention Span:Fair  Recall:Fair  Fund of Knowledge:Good  Language:Fair Physical Exam: Physical Exam ROS Blood pressure  130/89, pulse 90, temperature 97.6 F (36.4 C), temperature source Oral, resp. rate 20, height 5' 11.5" (1.816 m), weight 93 kg, SpO2 98 %.  Body mass index is 28.19 kg/m.   COGNITIVE FEATURES THAT CONTRIBUTE TO RISK:  None    SUICIDE RISK:   Minimal: No identifiable suicidal ideation.  Patients presenting with no risk factors but with morbid ruminations; may be classified as minimal risk based on the severity of the depressive symptoms   I certify that inpatient services furnished can reasonably be expected to improve the patient's condition.   Reggie Pile, MD 07/17/2020, 10:41 AM

## 2020-07-17 NOTE — ED Notes (Signed)
Pt is currently sleeping at this time. Breakfast tray placed in room at this time.
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
## 2020-07-22 ENCOUNTER — Emergency Department: Payer: Medicare Other

## 2020-07-22 ENCOUNTER — Other Ambulatory Visit: Payer: Self-pay

## 2020-07-22 ENCOUNTER — Emergency Department
Admission: EM | Admit: 2020-07-22 | Discharge: 2020-07-22 | Disposition: A | Discharge status: Home / Self Care | Payer: Medicare Other | Attending: Emergency Medicine | Admitting: Emergency Medicine

## 2020-07-22 DIAGNOSIS — F3132 Bipolar disorder, current episode depressed, moderate: Secondary | ICD-10-CM | POA: Diagnosis not present

## 2020-07-22 DIAGNOSIS — R739 Hyperglycemia, unspecified: Secondary | ICD-10-CM | POA: Diagnosis not present

## 2020-07-22 DIAGNOSIS — R059 Cough, unspecified: Secondary | ICD-10-CM | POA: Diagnosis not present

## 2020-07-22 DIAGNOSIS — K130 Diseases of lips: Secondary | ICD-10-CM | POA: Diagnosis not present

## 2020-07-22 DIAGNOSIS — R079 Chest pain, unspecified: Secondary | ICD-10-CM | POA: Diagnosis not present

## 2020-07-22 DIAGNOSIS — F17210 Nicotine dependence, cigarettes, uncomplicated: Secondary | ICD-10-CM | POA: Diagnosis not present

## 2020-07-22 DIAGNOSIS — E119 Type 2 diabetes mellitus without complications: Secondary | ICD-10-CM | POA: Insufficient documentation

## 2020-07-22 DIAGNOSIS — F1290 Cannabis use, unspecified, uncomplicated: Secondary | ICD-10-CM | POA: Diagnosis not present

## 2020-07-22 LAB — BASIC METABOLIC PANEL
Anion gap: 7 (ref 5–15)
BUN: 12 mg/dL (ref 6–20)
CO2: 28 mmol/L (ref 22–32)
Calcium: 8.8 mg/dL — ABNORMAL LOW (ref 8.9–10.3)
Chloride: 102 mmol/L (ref 98–111)
Creatinine, Ser: 1.01 mg/dL (ref 0.61–1.24)
GFR, Estimated: >60 mL/min (ref >60)
Glucose, Bld: 165 mg/dL — ABNORMAL HIGH (ref 70–99)
Potassium: 4 mmol/L (ref 3.5–5.1)
Sodium: 137 mmol/L (ref 135–145)

## 2020-07-22 LAB — CBC
HCT: 46.4 % (ref 39.0–52.0)
Hemoglobin: 15.8 g/dL (ref 13.0–17.0)
MCH: 31 pg (ref 26.0–34.0)
MCHC: 34.1 g/dL (ref 30.0–36.0)
MCV: 91 fL (ref 80.0–100.0)
Platelets: 246 10*3/uL (ref 150–400)
RBC: 5.1 MIL/uL (ref 4.22–5.81)
RDW: 12.7 % (ref 11.5–15.5)
WBC: 10.8 10*3/uL — ABNORMAL HIGH (ref 4.0–10.5)
nRBC: 0 % (ref 0.0–0.2)

## 2020-07-22 LAB — TROPONIN I (HIGH SENSITIVITY)
Troponin I (High Sensitivity): 8 ng/L (ref <18)
Troponin I (High Sensitivity): 7 ng/L (ref <18)

## 2020-07-22 MED ORDER — CARMEX DAILY CARE LIP BALM EX OINT: TOPICAL_OINTMENT | CUTANEOUS | 0 refills | Status: DC

## 2020-07-22 NOTE — ED Notes (Signed)
Unable to obtain labs. Pt stuck by this RN and Zach EDT. Lab contacted to collect specimens.

## 2020-07-22 NOTE — ED Notes (Signed)
PT c/o central CP, SHOB, dizziness, nausea. Pt arouses to voice, is AOX4, NAD noted.

## 2020-07-22 NOTE — ED Notes (Signed)
At request of pt and approved by first nurse, pt was given a sandwich tray and ginger ale.

## 2020-07-22 NOTE — ED Provider Notes (Signed)
[facility] Emergency Department Provider Note  Time seen: 10:59 PM  I have reviewed the triage vital signs and the nursing notes.   HISTORY  Chief Complaint Chest Pain   HPI Albert Miranda is a 40 y.o. male with a past medical history of bipolar, diabetes, schizophrenia, presents to the emergency department for chest pain.  Per report patient was experiencing chest pain  for the past 1 week.  Here the patient states he has been having some chest pain here and there but he also wanted to be evaluated for his lip which is somewhat dry/chapped appearance.  Denies any chest pain currently.  Denies any shortness of breath nausea vomiting or diarrhea.  Does have an occasional cough which has been ongoing for the past 1 week per patient.  Past Medical History:  Diagnosis Date  . Bipolar 1 disorder (HCC)   . Diabetes mellitus without complication (HCC)    A1C 10.1 01/2017   . Herpes   . Hyperglycemia   . Patient denies medical problems     Patient Active Problem List   Diagnosis Date Noted  . Acute pulmonary embolism (HCC) 01/19/2019  . Herpes 02/20/2017  . Penile lesion 02/09/2017  . Type 2 diabetes mellitus with hyperglycemia, without long-term current use of insulin (HCC) 02/09/2017  . Elevated blood sugar 10/23/2016  . Schizoaffective disorder (HCC) 05/01/2016  . Cannabis abuse 05/01/2016  . Noncompliance 05/01/2016    Past Surgical History:  Procedure Laterality Date  . INCISION AND DRAINAGE ABSCESS     buttock gluteal region   . LEG SURGERY    . PULMONARY THROMBECTOMY N/A 01/20/2019   Procedure: PULMONARY THROMBECTOMY, LEFT LOWER EXTREMITY VENOUS LYSIS, IVF FILTER INSERTION;  Surgeon: Annice Needy, MD;  Location: [facility] INVASIVE CV LAB;  Service: Cardiovascular;  Laterality: N/A;    Prior to Admission medications   Medication Sig Start Date End Date Taking? Authorizing Provider  ARIPiprazole (ABILIFY) 10 MG tablet Take 1 tablet (10 mg total) by  mouth daily. 07/17/20   Reggie Pile, MD  OLANZapine (ZYPREXA) 5 MG tablet Take 1 tablet (5 mg total) by mouth at bedtime. 07/17/20   Reggie Pile, MD    No Known Allergies  Family History  Problem Relation Age of Onset  . Diabetes Father        dx age 24   . Depression Mother     Social History Social History   Tobacco Use  . Smoking status: Current Every Day Smoker    Packs/day: 1.00    Types: Cigarettes  . Smokeless tobacco: Never Used  . Tobacco comment: 1-1.5 ppd since since age 10 as of 02/07/17 does not want to quit  Vaping Use  . Vaping Use: Never used  Substance Use Topics  . Alcohol use: Yes    Comment: occassional  . Drug use: Yes    Types: Marijuana    Comment: 1-2 x per month     Review of Systems Constitutional: Negative for fever. Cardiovascular: Intermittent mild chest pain, none currently Respiratory: Negative for shortness of breath.  Occasional cough Gastrointestinal: Negative for abdominal pain, vomiting  Musculoskeletal: Negative for musculoskeletal complaints Neurological: Negative for headache All other ROS negative  ____________________________________________   PHYSICAL EXAM:  VITAL SIGNS: ED Triage Vitals  Enc Vitals Group     BP 07/22/20 1810 132/84     Pulse Rate 07/22/20 1810 93     Resp 07/22/20 1810 18     Temp 07/22/20 1810 98.6  F (37 C)     Temp Source 07/22/20 1810 Oral     SpO2 07/22/20 1810 97 %     Weight 07/22/20 1816 205 lb (93 kg)     Height 07/22/20 1816 5' 11.5" (1.816 m)     Head Circumference --      Peak Flow --      Pain Score 07/22/20 1816 0     Pain Loc --      Pain Edu? --      Excl. in GC? --    Constitutional: Alert and oriented. Well appearing and in no distress. Eyes: Normal exam ENT      Head: Normocephalic and atraumatic.      Mouth/Throat: Mucous membranes are moist. Cardiovascular: Normal rate, regular rhythm.  Respiratory: Normal respiratory effort without tachypnea nor retractions. Breath  sounds are clear Gastrointestinal: Soft and nontender. No distention.   Musculoskeletal: Nontender with normal range of motion in all extremities.  Neurologic:  Normal speech and language. No gross focal neurologic deficits  Skin:  Skin is warm, dry and intact.  Psychiatric: Mood and affect are normal.  ____________________________________________    EKG  EKG viewed and interpreted by myself shows a normal sinus rhythm at 94 bpm with a narrow QRS, normal axis, normal intervals, nonspecific ST changes.  ____________________________________________    RADIOLOGY  Chest x-ray is negative  ____________________________________________   INITIAL IMPRESSION / ASSESSMENT AND PLAN / ED COURSE  Pertinent labs & imaging results that were available during my care of the patient were reviewed by me and considered in my medical decision making (see chart for details).   Patient presents emergency department with multiple complaints including intermittent chest pain over the past 1 week as well as dry/chapped lips.  Overall the patient appears well, no concerning findings on examination.  Patient does appear to have dry lips but no concerning findings on exam.  Patient's work-up is reassuring including negative troponin x2.  Chest x-ray and EKG.  Given the patient's reassuring work-up I believe the patient is safe for discharge home with outpatient follow-up.  Patient agreeable to plan of care.  Provided by normal chest pain return precautions.  Albert Miranda was evaluated in Emergency Department on 07/22/2020 for the symptoms described in the history of present illness. He was evaluated in the context of the global COVID-19 pandemic, which necessitated consideration that the patient might be at risk for infection with the SARS-CoV-2 virus that causes COVID-19. Institutional protocols and algorithms that pertain to the evaluation of patients at risk for COVID-19 are in a state of rapid change based on  information released by regulatory bodies including the CDC and federal and state organizations. These policies and algorithms were followed during the patient's care in the ED.  ____________________________________________   FINAL CLINICAL IMPRESSION(S) / ED DIAGNOSES  Chest pain   Minna Antis, MD 07/22/20 2306

## 2020-07-22 NOTE — ED Triage Notes (Signed)
Pt to ER with complaints of centralized, non-radiating chest pain that started approx one week ago.

## 2020-07-23 ENCOUNTER — Emergency Department
Admission: EM | Admit: 2020-07-23 | Discharge: 2020-07-24 | Disposition: A | Discharge status: Home / Self Care | Payer: Medicare Other | Attending: Emergency Medicine | Admitting: Emergency Medicine

## 2020-07-23 DIAGNOSIS — Z20822 Contact with and (suspected) exposure to covid-19: Secondary | ICD-10-CM | POA: Diagnosis not present

## 2020-07-23 DIAGNOSIS — R739 Hyperglycemia, unspecified: Secondary | ICD-10-CM | POA: Diagnosis not present

## 2020-07-23 DIAGNOSIS — E1165 Type 2 diabetes mellitus with hyperglycemia: Secondary | ICD-10-CM | POA: Insufficient documentation

## 2020-07-23 DIAGNOSIS — Z79899 Other long term (current) drug therapy: Secondary | ICD-10-CM | POA: Insufficient documentation

## 2020-07-23 DIAGNOSIS — F17210 Nicotine dependence, cigarettes, uncomplicated: Secondary | ICD-10-CM | POA: Diagnosis not present

## 2020-07-23 DIAGNOSIS — F1290 Cannabis use, unspecified, uncomplicated: Secondary | ICD-10-CM | POA: Insufficient documentation

## 2020-07-23 DIAGNOSIS — F259 Schizoaffective disorder, unspecified: Secondary | ICD-10-CM | POA: Diagnosis present

## 2020-07-23 DIAGNOSIS — F3132 Bipolar disorder, current episode depressed, moderate: Secondary | ICD-10-CM | POA: Diagnosis not present

## 2020-07-23 LAB — SALICYLATE LEVEL: Salicylate Lvl: <7 mg/dL — ABNORMAL LOW (ref 7.0–30.0)

## 2020-07-23 LAB — CBC
HCT: 46.6 % (ref 39.0–52.0)
Hemoglobin: 16 g/dL (ref 13.0–17.0)
MCH: 31.1 pg (ref 26.0–34.0)
MCHC: 34.3 g/dL (ref 30.0–36.0)
MCV: 90.7 fL (ref 80.0–100.0)
Platelets: 234 10*3/uL (ref 150–400)
RBC: 5.14 MIL/uL (ref 4.22–5.81)
RDW: 12.8 % (ref 11.5–15.5)
WBC: 7.3 10*3/uL (ref 4.0–10.5)
nRBC: 0 % (ref 0.0–0.2)

## 2020-07-23 LAB — URINE DRUG SCREEN, QUALITATIVE (ARMC ONLY)
Amphetamines, Ur Screen: NOT DETECTED
Barbiturates, Ur Screen: NOT DETECTED
Benzodiazepine, Ur Scrn: NOT DETECTED
Cannabinoid 50 Ng, Ur ~~LOC~~: POSITIVE — AB
Cocaine Metabolite,Ur ~~LOC~~: NOT DETECTED
MDMA (Ecstasy)Ur Screen: NOT DETECTED
Methadone Scn, Ur: NOT DETECTED
Opiate, Ur Screen: NOT DETECTED
Phencyclidine (PCP) Ur S: NOT DETECTED
Tricyclic, Ur Screen: NOT DETECTED

## 2020-07-23 LAB — COMPREHENSIVE METABOLIC PANEL
ALT: 37 U/L (ref 0–44)
AST: 69 U/L — ABNORMAL HIGH (ref 15–41)
Albumin: 3.6 g/dL (ref 3.5–5.0)
Alkaline Phosphatase: 78 U/L (ref 38–126)
Anion gap: 8 (ref 5–15)
BUN: 13 mg/dL (ref 6–20)
CO2: 25 mmol/L (ref 22–32)
Calcium: 8.6 mg/dL — ABNORMAL LOW (ref 8.9–10.3)
Chloride: 102 mmol/L (ref 98–111)
Creatinine, Ser: 0.99 mg/dL (ref 0.61–1.24)
GFR, Estimated: >60 mL/min (ref >60)
Glucose, Bld: 329 mg/dL — ABNORMAL HIGH (ref 70–99)
Potassium: 3.7 mmol/L (ref 3.5–5.1)
Sodium: 135 mmol/L (ref 135–145)
Total Bilirubin: 0.9 mg/dL (ref 0.3–1.2)
Total Protein: 7 g/dL (ref 6.5–8.1)

## 2020-07-23 LAB — ETHANOL: Alcohol, Ethyl (B): <10 mg/dL (ref <10)

## 2020-07-23 LAB — RESP PANEL BY RT-PCR (FLU A&B, COVID) ARPGX2
Influenza A by PCR: NEGATIVE
Influenza B by PCR: NEGATIVE
SARS Coronavirus 2 by RT PCR: NEGATIVE

## 2020-07-23 LAB — ACETAMINOPHEN LEVEL: Acetaminophen (Tylenol), Serum: <10 ug/mL — ABNORMAL LOW (ref 10–30)

## 2020-07-23 NOTE — ED Notes (Addendum)
Pt dressed in burgundy scrubs with myself and Nettie Elm RN present.  Pts belongings, include hat, sneakers, yellow colored earring studs (placed in cup and placed in shoe, 1 pr), shorts, tshirt, hoodie, bagged labelled and placed at nurses station.  Total of 1 bag.  Pt ambulated to [facility].  Pts bag has patient label only due to having no green labels available.  Pt denies cash or cell phone.

## 2020-07-23 NOTE — ED Notes (Signed)
VOL/pending reassessment 

## 2020-07-23 NOTE — ED Provider Notes (Signed)
[facility] Emergency Department Provider Note   ____________________________________________   Event Date/Time   First MD Initiated Contact with Patient 07/23/20 0127     (approximate)  I have reviewed the triage vital signs and the nursing notes.   HISTORY  Chief Complaint Psychiatric Evaluation    HPI Albert Miranda is a 40 y.o. male who returns to the ED seeking behavioral medicine evaluation.  Patient was seen in the ED last evening for chest pain with unremarkable work-up.  History of bipolar disorder, diabetes, cannabis abuse.  Denies active SI/HI/AH/VH.  Currently voices no medical complaints.     Past Medical History:  Diagnosis Date  . Bipolar 1 disorder (HCC)   . Diabetes mellitus without complication (HCC)    A1C 10.1 01/2017   . Herpes   . Hyperglycemia   . Patient denies medical problems     Patient Active Problem List   Diagnosis Date Noted  . Acute pulmonary embolism (HCC) 01/19/2019  . Herpes 02/20/2017  . Penile lesion 02/09/2017  . Type 2 diabetes mellitus with hyperglycemia, without long-term current use of insulin (HCC) 02/09/2017  . Elevated blood sugar 10/23/2016  . Schizoaffective disorder (HCC) 05/01/2016  . Cannabis abuse 05/01/2016  . Noncompliance 05/01/2016    Past Surgical History:  Procedure Laterality Date  . INCISION AND DRAINAGE ABSCESS     buttock gluteal region   . LEG SURGERY    . PULMONARY THROMBECTOMY N/A 01/20/2019   Procedure: PULMONARY THROMBECTOMY, LEFT LOWER EXTREMITY VENOUS LYSIS, IVF FILTER INSERTION;  Surgeon: Annice Needy, MD;  Location: [facility] INVASIVE CV LAB;  Service: Cardiovascular;  Laterality: N/A;    Prior to Admission medications   Medication Sig Start Date End Date Taking? Authorizing Provider  ARIPiprazole (ABILIFY) 10 MG tablet Take 1 tablet (10 mg total) by mouth daily. 07/17/20   Reggie Pile, MD  OLANZapine (ZYPREXA) 5 MG tablet Take 1 tablet (5 mg total) by mouth at bedtime.  07/17/20   Reggie Pile, MD  Sunscreens [facility] DAILY CARE LIP Luciano Cutter) OINT Please use as needed. 07/22/20   Minna Antis, MD    Allergies Patient has no known allergies.  Family History  Problem Relation Age of Onset  . Diabetes Father        dx age 42   . Depression Mother     Social History Social History   Tobacco Use  . Smoking status: Current Every Day Smoker    Packs/day: 1.00    Types: Cigarettes  . Smokeless tobacco: Never Used  . Tobacco comment: 1-1.5 ppd since since age 39 as of 02/07/17 does not want to quit  Vaping Use  . Vaping Use: Never used  Substance Use Topics  . Alcohol use: Yes    Comment: occassional  . Drug use: Yes    Types: Marijuana    Comment: 1-2 x per month     Review of Systems  Constitutional: No fever/chills Eyes: No visual changes. ENT: No sore throat. Cardiovascular: Denies chest pain. Respiratory: Denies shortness of breath. Gastrointestinal: No abdominal pain.  No nausea, no vomiting.  No diarrhea.  No constipation. Genitourinary: Negative for dysuria. Musculoskeletal: Negative for back pain. Skin: Negative for rash. Neurological: Negative for headaches, focal weakness or numbness. Psychiatric: Positive for depression.  ____________________________________________   PHYSICAL EXAM:  VITAL SIGNS: ED Triage Vitals  Enc Vitals Group     BP 07/23/20 0109 (!) 136/91     Pulse Rate 07/23/20 0109 84  Resp 07/23/20 0109 17     Temp 07/23/20 0109 97.9 F (36.6 C)     Temp Source 07/23/20 0109 Oral     SpO2 07/23/20 0109 98 %     Weight 07/23/20 0102 205 lb (93 kg)     Height 07/23/20 0102 5' 11.5" (1.816 m)     Head Circumference --      Peak Flow --      Pain Score 07/23/20 0102 0     Pain Loc --      Pain Edu? --      Excl. in GC? --     Constitutional: Alert and oriented. Well appearing and in no acute distress. Eyes: Conjunctivae are normal. PERRL. EOMI. Head: Atraumatic. Nose: No  congestion/rhinnorhea. Mouth/Throat: Mucous membranes are moist.   Neck: No stridor.   Cardiovascular: Normal rate, regular rhythm. Grossly normal heart sounds.  Good peripheral circulation. Respiratory: Normal respiratory effort.  No retractions. Lungs CTAB. Gastrointestinal: Soft and nontender. No distention. No abdominal bruits. No CVA tenderness. Musculoskeletal: No lower extremity tenderness nor edema.  No joint effusions. Neurologic:  Normal speech and language. No gross focal neurologic deficits are appreciated. No gait instability. Skin:  Skin is warm, dry and intact. No rash noted. Psychiatric: Mood and affect are normal. Speech and behavior are normal.  ____________________________________________   LABS (all labs ordered are listed, but only abnormal results are displayed)  Labs Reviewed  COMPREHENSIVE METABOLIC PANEL - Abnormal; Notable for the following components:      Result Value   Glucose, Bld 329 (*)    Calcium 8.6 (*)    AST 69 (*)    All other components within normal limits  SALICYLATE LEVEL - Abnormal; Notable for the following components:   Salicylate Lvl <7.0 (*)    All other components within normal limits  ACETAMINOPHEN LEVEL - Abnormal; Notable for the following components:   Acetaminophen (Tylenol), Serum <10 (*)    All other components within normal limits  URINE DRUG SCREEN, QUALITATIVE ([facility] ONLY) - Abnormal; Notable for the following components:   Cannabinoid 50 Ng, Ur [place] POSITIVE (*)    All other components within normal limits  RESP PANEL BY RT-PCR (FLU A&B, COVID) ARPGX2  ETHANOL  CBC   ____________________________________________  EKG  None ____________________________________________  RADIOLOGY I, Yvan Dority J, personally viewed and evaluated these images (plain radiographs) as part of my medical decision making, as well as reviewing the written report by the radiologist.  ED MD interpretation: None  Official radiology report(s): DG  Chest 2 View  Result Date: 07/22/2020 CLINICAL DATA:  Chest pain EXAM: CHEST - 2 VIEW COMPARISON:  07/16/2020 FINDINGS: The heart size and mediastinal contours are within normal limits. Both lungs are clear. No pleural effusion or pneumothorax. The visualized skeletal structures are unremarkable. IMPRESSION: No active cardiopulmonary disease. Electronically Signed   By: Guadlupe Spanish M.D.   On: 07/22/2020 18:35    ____________________________________________   PROCEDURES  Procedure(s) performed (including Critical Care):  Procedures   ____________________________________________   INITIAL IMPRESSION / ASSESSMENT AND PLAN / ED COURSE  As part of my medical decision making, I reviewed the following data within the electronic MEDICAL RECORD NUMBER Nursing notes reviewed and incorporated, Labs reviewed, Old chart reviewed, A consult was requested and obtained from this/these consultant(s) Psychiatry and Notes from prior ED visits     40 year old male seeking behavioral medicine evaluation, history of bipolar disorder, schizoaffective disorder.  Contracts for safety while in the emergency department. The  patient has been placed in psychiatric observation due to the need to provide a safe environment for the patient while obtaining psychiatric consultation and evaluation, as well as ongoing medical and medication management to treat the patient's condition.  The patient has not been placed under full IVC at this time.     Clinical Course as of 07/23/20 0423  Wynelle Link Jul 23, 2020  0423 Patient seen by psychiatric NP; will reassess in the morning.  Patient remains in the ED voluntarily pending psychiatric disposition. [JS]    Clinical Course User Index [JS] Irean Hong, MD     ____________________________________________   FINAL CLINICAL IMPRESSION(S) / ED DIAGNOSES  Final diagnoses:  Bipolar affective disorder, currently depressed, moderate (HCC)  Marijuana use  Hyperglycemia      ED Discharge Orders    None      *Please note:  Albert Miranda was evaluated in Emergency Department on 07/23/2020 for the symptoms described in the history of present illness. He was evaluated in the context of the global COVID-19 pandemic, which necessitated consideration that the patient might be at risk for infection with the SARS-CoV-2 virus that causes COVID-19. Institutional protocols and algorithms that pertain to the evaluation of patients at risk for COVID-19 are in a state of rapid change based on information released by regulatory bodies including the CDC and federal and state organizations. These policies and algorithms were followed during the patient's care in the ED.  Some ED evaluations and interventions may be delayed as a result of limited staffing during and the pandemic.*   Note:  This document was prepared using Dragon voice recognition software and may include unintentional dictation errors.   Irean Hong, MD 07/23/20 [phone]

## 2020-07-23 NOTE — BH Assessment (Signed)
Writer spoke with the patient to complete an updated/reassessment. Patient denies SI/HI. He admits to AV/H. When writer asked more questions regarding it, he stated he wasn't having them anymore and started minimize the history. He acknowledged his Bipolar dx but then shared his mother made him have the dx so he can get a check to take care of his self. During the interview, it seemed patient was having thought blocking or choosing carefully what he would say. After an extended pause, he would say, I just need to get my brain checked out but nothing is wrong with me though. Several times when talking, he would confuse hisself and his thoughts were disorganized. He'll stop and try to explain what he what he was trying to say but eventually say never mind and completely stop talking.

## 2020-07-23 NOTE — ED Notes (Signed)
Patient up and at nurses station requesting to speak with doctor. Patient advised will be seen when doctor gets in today. Patient requesting shower. Clean cloths and shower and oral hygiene supplied provided.

## 2020-07-23 NOTE — ED Notes (Signed)
Pt. Transferred from Triage to room [facility] after dressing out and screening for contraband. Report to include Situation, Background, Assessment and Recommendations from [facility]. Pt. Oriented to Quad including Q15 minute rounds as well as Psychologist, counselling for their protection. Patient is alert and oriented, warm and dry in no acute distress. Patient denies SI, and HI. Report AVH. He asked for something to eat and drink.  Pt. Encouraged to let me know if needs arise.

## 2020-07-23 NOTE — ED Notes (Signed)
Snack and beverage given. 

## 2020-07-23 NOTE — ED Notes (Signed)
Hourly rounding reveals patient in room. No complaints, stable, in no acute distress. Q15 minute rounds and monitoring via Rover and Officer to continue.   
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
## 2020-07-23 NOTE — BH Assessment (Signed)
Comprehensive Clinical Assessment (CCA) Note  07/23/2020 TOU Albert Miranda [id number] Recommendations for Services/Supports/Treatments: Consulted with [name]., NP, who explained that the pt. is recommended for overnight observation and reassessment in the AM. Notified [name] and [name], RN of disposition recommendation.   Pt was resting upon this writer's arrival. Pt had an unremarkable appearance and was drowsy. When asked what brought him to the hospital the pt stated, "I need to get my mind checked." Pt's speech was garbled due to him biting his bottom lip intermittedly while talking throughout the assessment. Motor behavior appears normal. Pt's thought content was irrelevant as he reverted to needing his mind checked when asked most questions. Pt unable to articulate and expand much further than this. Eye contact was poor. Pt's mood is constricted, affect is blunted. Patient was noted to have no insight and impaired judgement. The patient denied SI and HI. Pt admitted to experiencing AV/hallucinations at times but not in the present. Pt admitted to medication noncompliance, explaining that he doesn't need medications. Pt identified his stressors as his upcoming marriage and "things getting hectic". Pt denied symptoms of paranoia. Patient was calm and cooperative throughout the interview.  Flowsheet Row ED from 07/23/2020 in [facility] EM[facility]PART[facility] ED from 07/22/2020 in [facility] EMERGENCY DEPARTMENT ED from 07/16/2020 in [facility]  C-SSRS RISK CATEGORY No Risk No Risk No Risk    The patient demonstrates the following risk factors for suicide: Chronic risk factors for suicide include: psychiatric disorder of schizoaffective d/o. Acute risk factors for suicide include: N/A. Protective factors for this patient include: hope for the future. Considering these factors, the overall suicide risk at this point appears  to be no risk. Patient is appropriate for outpatient follow up.  Therefore; pt is not recommended for sitter safety precautions at this time.   Chief Complaint:  Chief Complaint  Patient presents with  . Psychiatric Evaluation   Visit Diagnosis: Schizoaffective disorder Noncompliance    CCA Screening, Triage and Referral (STR)  Patient Reported Information How did you hear about [place]? Self  Referral name: Self  Referral phone number: No data recorded  Whom do you see for routine medical problems? I don't have a doctor  Practice/Facility Name: No data recorded Practice/Facility Phone Number: No data recorded Name of Contact: No data recorded Contact Number: No data recorded Contact Fax Number: No data recorded Prescriber Name: No data recorded Prescriber Address (if known): No data recorded  What Is the Reason for Your Visit/Call Today? "I need to get my mind checked"  How Long Has This Been Causing You Problems? 1 wk - 1 month  What Do You Feel Would Help You the Most Today? -- (Pt unable to identify needs)   Have You Recently Been in Any Inpatient Treatment (Hospital/Detox/Crisis Center/28-Day Program)? No  Name/Location of Program/Hospital:No data recorded How Long Were You There? No data recorded When Were You Discharged? No data recorded  Have You Ever Received Services From [facility] Before? No  Who Do You See at [facility]? No data recorded  Have You Recently Had Any Thoughts About Hurting Yourself? No  Are You Planning to Commit Suicide/Harm Yourself At This time? No   Have you Recently Had Thoughts About Hurting Someone [name]? No  Explanation: No data recorded  Have You Used Any Alcohol or Drugs in the Past 24 Hours? No  How Long Ago Did You Use Drugs or Alcohol? No data recorded What  Did You Use and How Much? No data recorded  Do You Currently Have a Therapist/Psychiatrist? No  Name of Therapist/Psychiatrist: No data recorded  Have You Been  Recently Discharged From Any Office Practice or Programs? No  Explanation of Discharge From Practice/Program: No data recorded    CCA Screening Triage Referral Assessment Type of Contact: Face-to-Face  Is this Initial or Reassessment? No data recorded Date Telepsych consult ordered in [facility]:  No data recorded Time Telepsych consult ordered in [facility]:  No data recorded  Patient Reported Information Reviewed? Yes  Patient Left Without Being Seen? No data recorded Reason for Not Completing Assessment: No data recorded  Collateral Involvement: No data recorded  Does Patient Have a Court Appointed Legal Guardian? No data recorded Name and Contact of Legal Guardian: No data recorded If Minor and Not Living with Parent(s), Who has Custody? No data recorded Is CPS involved or ever been involved? Never  Is APS involved or ever been involved? Never   Patient Determined To Be At Risk for Harm To Self or Others Based on Review of Patient Reported Information or Presenting Complaint? No  Method: No data recorded Availability of Means: No data recorded Intent: No data recorded Notification Required: No data recorded Additional Information for Danger to Others Potential: No data recorded Additional Comments for Danger to Others Potential: No data recorded Are There Guns or Other Weapons in Your Home? No data recorded Types of Guns/Weapons: No data recorded Are These Weapons Safely Secured?                            No data recorded Who Could Verify You Are Able To Have These Secured: No data recorded Do You Have any Outstanding Charges, Pending Court Dates, Parole/Probation? No data recorded Contacted To Inform of Risk of Harm To Self or Others: No data recorded  [facility]   Does Patient Present under Involuntary Commitment? No  IVC Papers Initial File Date: No data recorded  [place]   Patient Currently Receiving the Following Services: Not  Receiving Services   Determination of Need: Routine (7 days)   Options For Referral: -- (Overnight observation and reassessment in the AM.)     CCA Biopsychosocial Intake/Chief Complaint:  Psychiatric Evaluation  Current Symptoms/Problems: Medication noncompliance   Patient Reported Schizophrenia/Schizoaffective Diagnosis in Past: Yes   Strengths: Hope  Preferences: None noted  Abilities: Asks for help   Type of Services Patient Feels are Needed: Medication management   Initial Clinical Notes/Concerns: No data recorded  Mental Health Symptoms Depression:  None   Duration of Depressive symptoms: No data recorded  Mania:  None   Anxiety:   Worrying   Psychosis:  None   Duration of Psychotic symptoms: No data recorded  Trauma:  None   Obsessions:  None   Compulsions:  None   Inattention:  None   Hyperactivity/Impulsivity:  N/A   Oppositional/Defiant Behaviors:  None   Emotional Irregularity:  None   Other Mood/Personality Symptoms:  No data recorded   Mental Status Exam Appearance and self-care  Stature:  Tall   Weight:  Average weight   Clothing:  Casual   Grooming:  Normal   Cosmetic use:  None   Posture/gait:  Normal   Motor activity:  Not Remarkable   Sensorium  Attention:  Normal   Concentration:  Normal   Orientation:  X5   Recall/memory:  Normal  Affect and Mood  Affect:  Blunted   Mood:  Euthymic   Relating  Eye contact:  None   Facial expression:  Constricted   Attitude toward examiner:  Cooperative   Thought and Language  Speech flow: Garbled   Thought content:  Appropriate to Mood and Circumstances   Preoccupation:  None   Hallucinations:  None   Organization:  No data recorded  [employer] of Knowledge:  Impoverished by (Comment) (Mental illness)   Intelligence:  Average   Abstraction:  Functional   Judgement:  Fair   Reality Testing:  Variable   Insight:  Lacking   Decision  Making:  Vacilates   Social Functioning  Social Maturity:  Responsible   Social Judgement:  Impropriety   Stress  Stressors:  Transitions; Relationship   Coping Ability:  Overwhelmed   Skill Deficits:  Communication   Supports:  Support needed     Religion:    Leisure/Recreation:    Exercise/Diet: Exercise/Diet Do You Have Any Trouble Sleeping?: Yes Explanation of Sleeping Difficulties: Pt stated, "Not much".   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:  Industrial/product designer) Name of High School:  (UTA) Did You Graduate From [employer]?:  (UTA) Did You Attend College?:  (UTA) Did You Attend Graduate School?:  (UTA) Did You Have Any Special Interests In School?:  (UTA) Did You Have An Individualized Education Program (IIEP):  (UTA) Did You Have Any Difficulty At School?:  (UTA) Patient's Education Has Been Impacted by Current Illness:  (UTA)   CCA Family/Childhood History Family and Relationship History: Family history Marital status: Single Are you sexually active?:  (UTA) What is your sexual orientation?:  (UTA) Has your sexual activity been affected by drugs, alcohol, medication, or emotional stress?:  (UTA) Does patient have children?: No  Childhood History:  Childhood History By whom was/is the patient raised?:  (UTA) Additional childhood history information:  (UTA) Description of patient's relationship with caregiver when they were a child:  (UTA) Patient's description of current relationship with people who raised him/her:  (UTA) How were you disciplined when you got in trouble as a  child/adolescent?:  (UTA) Does patient have siblings?:  (UTA) Did patient suffer any verbal/emotional/physical/sexual abuse as a child?:  (UTA) Did patient suffer from severe childhood neglect?:  (UTA) Has patient ever been sexually abused/assaulted/raped as an adolescent or adult?:  (UTA) Was the patient ever a victim of a crime or a disaster?:  (UTA) Witnessed domestic violence?:  (UTA) Has patient been affected by domestic violence as an adult?:  Industrial/product designer)  Child/Adolescent Assessment:     CCA Substance Use Alcohol/Drug Use: Alcohol / Drug Use Pain Medications: See PTA Prescriptions: See PTA Over the Counter: See PTA History of alcohol / drug use?: Yes Longest period of sobriety (when/how long): Unable to Assess Negative Consequences of Use: Personal relationships Withdrawal Symptoms:  (N/A) Substance #1 Name of Substance 1: Cannabanoid                       ASAM's:  Six Dimensions of Multidimensional Assessment  Dimension 1:  Acute Intoxication and/or Withdrawal Potential:  Dimension 2:  Biomedical Conditions and Complications:      Dimension 3:  Emotional, Behavioral, or Cognitive Conditions and Complications:     Dimension 4:  Readiness to Change:     Dimension 5:  Relapse, Continued use, or Continued Problem Potential:     Dimension 6:  Recovery/Living Environment:     ASAM Severity Score:    ASAM Recommended Level of Treatment:     Substance use Disorder (SUD) Substance Use Disorder (SUD)  Checklist Symptoms of Substance Use: Continued use despite having a persistent/recurrent physical/psychological problem caused/exacerbated by use  Recommendations for Services/Supports/Treatments:    DSM5 Diagnoses: Patient Active Problem List   Diagnosis Date Noted  . Bipolar affective disorder, currently depressed, moderate (HCC)   . Marijuana use   . Acute pulmonary embolism (HCC) 01/19/2019  . Herpes 02/20/2017  . Penile lesion 02/09/2017  . Type 2  diabetes mellitus with hyperglycemia, without long-term current use of insulin (HCC) 02/09/2017  . Hyperglycemia 10/23/2016  . Schizoaffective disorder (HCC) 05/01/2016  . Cannabis abuse 05/01/2016  . Noncompliance 05/01/2016   Eulamae Greenstein R Oberon Hehir, LCAS

## 2020-07-23 NOTE — ED Notes (Signed)
Pt moved to BHU 

## 2020-07-23 NOTE — ED Notes (Signed)
Patient asked for tooth brush and paste. He is in the restroom brushing his teeth.

## 2020-07-23 NOTE — ED Triage Notes (Signed)
Patient states "I want to get my mind checked, I need to voluntarily check my self in".

## 2020-07-23 NOTE — ED Notes (Signed)
VOL/SOC not completed due to patient unwillingness to engage/ Recommend continued treatment and Re-Assessment

## 2020-07-23 NOTE — ED Notes (Signed)
Pt given [place] sandwich tray, ice cream and a sprite.

## 2020-07-24 NOTE — ED Notes (Signed)
E-signature not working at this time. Patient verbalized desire to leave. MD Carolyn Stare at bedside to reassess patient. Patient denies SI/HI, AH/VH. Patient verbalizes plan to take bus to homeless shelter then to follow up with [facility]. Pt verbalized understanding of D/C instructions, prescriptions and follow up care with no further questions at this time. Pt in NAD and ambulatory at time of D/C.

## 2020-07-24 NOTE — ED Provider Notes (Signed)
Patient stating that he is ready for discharge.  He is here voluntary.  Denies any SI or HI.  States he needs to be discharged that he can set up for the homeless shelter.  Stable appropriate for outpatient follow-up.   Willy Eddy, MD 07/24/20 1037

## 2020-07-24 NOTE — ED Provider Notes (Signed)
Emergency Medicine Observation Re-evaluation Note  Albert Miranda is a 40 y.o. male, seen on rounds today.  Pt initially presented to the ED for complaints of Psychiatric Evaluation Currently, the patient is resting comfortably.  Physical Exam  BP (!) 136/92 (BP Location: Right Arm)   Pulse 69   Temp 98.8 F (37.1 C) (Oral)   Resp 17   Ht 5' 11.5" (1.816 m)   Wt 93 kg   SpO2 97%   BMI 28.19 kg/m  Physical Exam General: NAD Cardiac: well perfused Lungs: even and unlabored Psych: calm  ED Course / MDM  EKG:   I have reviewed the labs performed to date as well as medications administered while in observation.  Recent changes in the last 24 hours include none.  Plan  Current plan is for psych re-evaluation. Patient is not under full IVC at this time.   Willy Eddy, MD 07/24/20 [phone]

## 2020-08-09 ENCOUNTER — Other Ambulatory Visit: Payer: Self-pay

## 2020-08-09 ENCOUNTER — Emergency Department
Admission: EM | Admit: 2020-08-09 | Discharge: 2020-08-10 | Disposition: A | Discharge status: Home / Self Care | Payer: Medicare Other | Attending: Emergency Medicine | Admitting: Emergency Medicine

## 2020-08-09 ENCOUNTER — Emergency Department: Payer: Medicare Other

## 2020-08-09 ENCOUNTER — Encounter: Payer: Self-pay | Admitting: Emergency Medicine

## 2020-08-09 DIAGNOSIS — R079 Chest pain, unspecified: Secondary | ICD-10-CM | POA: Diagnosis not present

## 2020-08-09 DIAGNOSIS — Z5321 Procedure and treatment not carried out due to patient leaving prior to being seen by health care provider: Secondary | ICD-10-CM | POA: Insufficient documentation

## 2020-08-09 LAB — COMPREHENSIVE METABOLIC PANEL
ALT: 20 U/L (ref 0–44)
AST: 19 U/L (ref 15–41)
Albumin: 3.6 g/dL (ref 3.5–5.0)
Alkaline Phosphatase: 65 U/L (ref 38–126)
Anion gap: 6 (ref 5–15)
BUN: 12 mg/dL (ref 6–20)
CO2: 27 mmol/L (ref 22–32)
Calcium: 8.6 mg/dL — ABNORMAL LOW (ref 8.9–10.3)
Chloride: 104 mmol/L (ref 98–111)
Creatinine, Ser: 0.99 mg/dL (ref 0.61–1.24)
GFR, Estimated: >60 mL/min (ref >60)
Glucose, Bld: 154 mg/dL — ABNORMAL HIGH (ref 70–99)
Potassium: 3.4 mmol/L — ABNORMAL LOW (ref 3.5–5.1)
Sodium: 137 mmol/L (ref 135–145)
Total Bilirubin: 0.6 mg/dL (ref 0.3–1.2)
Total Protein: 7.1 g/dL (ref 6.5–8.1)

## 2020-08-09 LAB — CBC WITH DIFFERENTIAL/PLATELET
Abs Immature Granulocytes: 0.02 10*3/uL (ref 0.00–0.07)
Basophils Absolute: 0 10*3/uL (ref 0.0–0.1)
Basophils Relative: 0 %
Eosinophils Absolute: 0.2 10*3/uL (ref 0.0–0.5)
Eosinophils Relative: 2 %
HCT: 42.7 % (ref 39.0–52.0)
Hemoglobin: 14.7 g/dL (ref 13.0–17.0)
Immature Granulocytes: 0 %
Lymphocytes Relative: 42 %
Lymphs Abs: 2.8 10*3/uL (ref 0.7–4.0)
MCH: 31.3 pg (ref 26.0–34.0)
MCHC: 34.4 g/dL (ref 30.0–36.0)
MCV: 90.9 fL (ref 80.0–100.0)
Monocytes Absolute: 0.4 10*3/uL (ref 0.1–1.0)
Monocytes Relative: 5 %
Neutro Abs: 3.3 10*3/uL (ref 1.7–7.7)
Neutrophils Relative %: 51 %
Platelets: 247 10*3/uL (ref 150–400)
RBC: 4.7 MIL/uL (ref 4.22–5.81)
RDW: 12.6 % (ref 11.5–15.5)
WBC: 6.7 10*3/uL (ref 4.0–10.5)
nRBC: 0 % (ref 0.0–0.2)

## 2020-08-09 LAB — TROPONIN I (HIGH SENSITIVITY)
Troponin I (High Sensitivity): 6 ng/L (ref <18)
Troponin I (High Sensitivity): 5 ng/L (ref <18)

## 2020-08-09 NOTE — ED Triage Notes (Signed)
Patient ambulatory to triage with steady gait, without difficulty or distress noted; pt reports upper CP that began yesterday with no accomp symptoms

## 2020-08-10 NOTE — ED Notes (Signed)
No answer when called several times from lobby 

## 2020-08-10 NOTE — ED Notes (Addendum)
No answer when called several times from lobby; no answer when phone # listed in chart called 

## 2020-10-09 ENCOUNTER — Emergency Department
Admission: EM | Admit: 2020-10-09 | Discharge: 2020-10-10 | Disposition: A | Discharge status: Home / Self Care | Payer: Medicare Other | Attending: Emergency Medicine | Admitting: Emergency Medicine

## 2020-10-09 ENCOUNTER — Emergency Department: Payer: Medicare Other

## 2020-10-09 ENCOUNTER — Other Ambulatory Visit: Payer: Self-pay

## 2020-10-09 DIAGNOSIS — R519 Headache, unspecified: Secondary | ICD-10-CM | POA: Diagnosis not present

## 2020-10-09 DIAGNOSIS — F1210 Cannabis abuse, uncomplicated: Secondary | ICD-10-CM | POA: Diagnosis present

## 2020-10-09 DIAGNOSIS — Z20822 Contact with and (suspected) exposure to covid-19: Secondary | ICD-10-CM | POA: Diagnosis not present

## 2020-10-09 DIAGNOSIS — J029 Acute pharyngitis, unspecified: Secondary | ICD-10-CM | POA: Diagnosis not present

## 2020-10-09 DIAGNOSIS — E119 Type 2 diabetes mellitus without complications: Secondary | ICD-10-CM | POA: Insufficient documentation

## 2020-10-09 DIAGNOSIS — Y900 Blood alcohol level of less than 20 mg/100 ml: Secondary | ICD-10-CM | POA: Diagnosis not present

## 2020-10-09 DIAGNOSIS — R0789 Other chest pain: Secondary | ICD-10-CM | POA: Insufficient documentation

## 2020-10-09 DIAGNOSIS — R079 Chest pain, unspecified: Secondary | ICD-10-CM | POA: Diagnosis present

## 2020-10-09 DIAGNOSIS — F17210 Nicotine dependence, cigarettes, uncomplicated: Secondary | ICD-10-CM | POA: Insufficient documentation

## 2020-10-09 DIAGNOSIS — F259 Schizoaffective disorder, unspecified: Secondary | ICD-10-CM | POA: Diagnosis not present

## 2020-10-09 DIAGNOSIS — R0602 Shortness of breath: Secondary | ICD-10-CM | POA: Insufficient documentation

## 2020-10-09 DIAGNOSIS — R739 Hyperglycemia, unspecified: Secondary | ICD-10-CM | POA: Diagnosis present

## 2020-10-09 DIAGNOSIS — E1165 Type 2 diabetes mellitus with hyperglycemia: Secondary | ICD-10-CM | POA: Diagnosis present

## 2020-10-09 DIAGNOSIS — R45850 Homicidal ideations: Secondary | ICD-10-CM | POA: Diagnosis not present

## 2020-10-09 DIAGNOSIS — F319 Bipolar disorder, unspecified: Secondary | ICD-10-CM

## 2020-10-09 DIAGNOSIS — F250 Schizoaffective disorder, bipolar type: Secondary | ICD-10-CM | POA: Diagnosis not present

## 2020-10-09 LAB — SALICYLATE LEVEL: Salicylate Lvl: <7 mg/dL — ABNORMAL LOW (ref 7.0–30.0)

## 2020-10-09 LAB — CBC
HCT: 41 % (ref 39.0–52.0)
Hemoglobin: 14.2 g/dL (ref 13.0–17.0)
MCH: 31.9 pg (ref 26.0–34.0)
MCHC: 34.6 g/dL (ref 30.0–36.0)
MCV: 92.1 fL (ref 80.0–100.0)
Platelets: 233 10*3/uL (ref 150–400)
RBC: 4.45 MIL/uL (ref 4.22–5.81)
RDW: 13.2 % (ref 11.5–15.5)
WBC: 5.5 10*3/uL (ref 4.0–10.5)
nRBC: 0 % (ref 0.0–0.2)

## 2020-10-09 LAB — ACETAMINOPHEN LEVEL: Acetaminophen (Tylenol), Serum: <10 ug/mL — ABNORMAL LOW (ref 10–30)

## 2020-10-09 LAB — ETHANOL: Alcohol, Ethyl (B): <10 mg/dL (ref <10)

## 2020-10-09 MED ORDER — OLANZAPINE 5 MG PO TABS
5.0000 mg | ORAL_TABLET | Freq: Every day | ORAL | Status: DC
  Administered 2020-10-09: 5 mg via ORAL
  Filled 2020-10-09: qty 1

## 2020-10-09 MED ORDER — IBUPROFEN 800 MG PO TABS
800.0000 mg | ORAL_TABLET | Freq: Once | ORAL | Status: AC
  Administered 2020-10-09: 800 mg via ORAL
  Filled 2020-10-09: qty 1

## 2020-10-09 MED ORDER — ARIPIPRAZOLE 10 MG PO TABS
10.0000 mg | ORAL_TABLET | Freq: Every day | ORAL | Status: DC
  Filled 2020-10-09 (×2): qty 1

## 2020-10-09 NOTE — ED Notes (Signed)
ED Provider at bedside. 

## 2020-10-09 NOTE — ED Provider Notes (Signed)
[facility] Emergency Department Provider Note  ____________________________________________   Event Date/Time   First MD Initiated Contact with Patient 10/09/20 2256     (approximate)  I have reviewed the triage vital signs and the nursing notes.   HISTORY  Chief Complaint Chest Pain, Headache, and Homicidal    HPI Albert Miranda is a 40 y.o. male with history of schizoaffective disorder, bipolar disorder, diabetes, pulmonary embolus status post pulmonary thrombectomy who presents to the emergency department with complaint plaints of headache, sore throat, throbbing chest pain, shortness of breath that he states started before he was supposed to have a "event".  He tells me that he is supposed to get money and go to court and get married and this causes him to be "irritated" and he is worried that he is going to hurt people.  No SI, hallucinations.  Reports marijuana use.  Denies any other drug or alcohol use.        Past Medical History:  Diagnosis Date   Bipolar 1 disorder (HCC)    Diabetes mellitus without complication (HCC)    A1C 10.1 01/2017    Herpes    Hyperglycemia    Patient denies medical problems     Patient Active Problem List   Diagnosis Date Noted   Bipolar affective disorder, currently depressed, moderate (HCC)    Marijuana use    Acute pulmonary embolism (HCC) 01/19/2019   Herpes 02/20/2017   Penile lesion 02/09/2017   Type 2 diabetes mellitus with hyperglycemia, without long-term current use of insulin (HCC) 02/09/2017   Hyperglycemia 10/23/2016   Schizoaffective disorder (HCC) 05/01/2016   Cannabis abuse 05/01/2016   Noncompliance 05/01/2016    Past Surgical History:  Procedure Laterality Date   INCISION AND DRAINAGE ABSCESS     buttock gluteal region    LEG SURGERY     PULMONARY THROMBECTOMY N/A 01/20/2019   Procedure: PULMONARY THROMBECTOMY, LEFT LOWER EXTREMITY VENOUS LYSIS, IVF FILTER INSERTION;  Surgeon: Annice Needy, MD;  Location: [facility] INVASIVE CV LAB;  Service: Cardiovascular;  Laterality: N/A;    Prior to Admission medications   Medication Sig Start Date End Date Taking? Authorizing Provider  ARIPiprazole (ABILIFY) 10 MG tablet Take 1 tablet (10 mg total) by mouth daily. 07/17/20   Reggie Pile, MD  OLANZapine (ZYPREXA) 5 MG tablet Take 1 tablet (5 mg total) by mouth at bedtime. 07/17/20   Reggie Pile, MD  Sunscreens [facility] DAILY CARE LIP Luciano Cutter) OINT Please use as needed. 07/22/20   Minna Antis, MD    Allergies Patient has no known allergies.  Family History  Problem Relation Age of Onset   Diabetes Father        dx age 16    Depression Mother     Social History Social History   Tobacco Use   Smoking status: Every Day    Packs/day: 1.00    Types: Cigarettes   Smokeless tobacco: Never   Tobacco comments:    1-1.5 ppd since since age 5 as of 02/07/17 does not want to quit  Vaping Use   Vaping Use: Never used  Substance Use Topics   Alcohol use: Yes    Comment: occassional   Drug use: Yes    Types: Marijuana    Comment: 1-2 x per month     Review of Systems Constitutional: No fever. Eyes: No visual changes. ENT: + sore throat. Cardiovascular: + chest pain. Respiratory: +  shortness of breath. Gastrointestinal: No nausea, vomiting,  diarrhea. Genitourinary: Negative for dysuria. Musculoskeletal: Negative for back pain. Skin: Negative for rash. Neurological: Negative for focal weakness or numbness.  ____________________________________________   PHYSICAL EXAM:  VITAL SIGNS: ED Triage Vitals  Enc Vitals Group     BP 10/09/20 2231 134/84     Pulse Rate 10/09/20 2231 92     Resp 10/09/20 2231 18     Temp 10/09/20 2231 99.4 F (37.4 C)     Temp Source 10/09/20 2231 Oral     SpO2 10/09/20 2231 97 %     Weight 10/09/20 2232 205 lb 0.4 oz (93 kg)     Height 10/09/20 2232 5\' 11"  (1.803 m)     Head Circumference --      Peak Flow --      Pain Score  10/09/20 2231 10     Pain Loc --      Pain Edu? --      Excl. in GC? --    CONSTITUTIONAL: Alert and oriented and responds appropriately to questions. Well-appearing; well-nourished HEAD: Normocephalic EYES: Conjunctivae clear, pupils appear equal, EOM appear intact ENT: normal nose; moist mucous membranes NECK: Supple, normal ROM CARD: RRR; S1 and S2 appreciated; no murmurs, no clicks, no rubs, no gallops RESP: Normal chest excursion without splinting or tachypnea; breath sounds clear and equal bilaterally; no wheezes, no rhonchi, no rales, no hypoxia or respiratory distress, speaking full sentences ABD/GI: Normal bowel sounds; non-distended; soft, non-tender, no rebound, no guarding, no peritoneal signs, no hepatosplenomegaly BACK: The back appears normal EXT: Normal ROM in all joints; no deformity noted, no edema; no cyanosis SKIN: Normal color for age and race; warm; no rash on exposed skin NEURO: Moves all extremities equally PSYCH: Calm but reports thoughts of wanting to harm other people.  No SI, hallucinations.  ____________________________________________   LABS (all labs ordered are listed, but only abnormal results are displayed)  Labs Reviewed  COMPREHENSIVE METABOLIC PANEL - Abnormal; Notable for the following components:      Result Value   Potassium 3.2 (*)    Glucose, Bld 129 (*)    Calcium 8.5 (*)    All other components within normal limits  SALICYLATE LEVEL - Abnormal; Notable for the following components:   Salicylate Lvl <7.0 (*)    All other components within normal limits  ACETAMINOPHEN LEVEL - Abnormal; Notable for the following components:   Acetaminophen (Tylenol), Serum <10 (*)    All other components within normal limits  RESP PANEL BY RT-PCR (FLU A&B, COVID) ARPGX2  ETHANOL  CBC  URINE DRUG SCREEN, QUALITATIVE ([facility] ONLY)  TROPONIN I (HIGH SENSITIVITY)   ____________________________________________  EKG   EKG  Interpretation  Date/Time:  Monday October 09 2020 22:31:16 EDT Ventricular Rate:  92 PR Interval:  128 QRS Duration: 68 QT Interval:  366 QTC Calculation: 452 R Axis:   67 Text Interpretation: Normal sinus rhythm Normal ECG Confirmed by [id number] [phone]) on 10/09/2020 11:14:27 PM        ____________________________________________  RADIOLOGY 10/11/2020 Marquavious Nazar, personally viewed and evaluated these images (plain radiographs) as part of my medical decision making, as well as reviewing the written report by the radiologist.  ED MD interpretation:  CXR clear.  Official radiology report(s): DG Chest Portable 1 View  Result Date: 10/09/2020 CLINICAL DATA:  Chest pain short of breath EXAM: PORTABLE CHEST 1 VIEW COMPARISON:  08/09/2020 FINDINGS: The heart size and mediastinal contours are within normal limits. Both lungs are clear. The visualized skeletal structures  are unremarkable. IMPRESSION: No active disease. Electronically Signed   By: Jasmine Pang M.D.   On: 10/09/2020 23:59    ____________________________________________   PROCEDURES  Procedure(s) performed (including Critical Care):  Procedures    ____________________________________________   INITIAL IMPRESSION / ASSESSMENT AND PLAN / ED COURSE  As part of my medical decision making, I reviewed the following data within the electronic MEDICAL RECORD NUMBER Nursing notes reviewed and incorporated, Labs reviewed , EKG interpreted , Old EKG reviewed, Old chart reviewed, Radiograph reviewed , A consult was requested and obtained from this/these consultant(s) Psychiatry, and Notes from prior ED visits         Patient here with complaints of thoughts of wanting to harm others.  Currently here voluntarily.  Will consult [name], psychiatry.  Also complaining of headache, sore throat, chest pounding, shortness of breath.  Does have a low-grade temperature here of 99.4.  Will test for COVID, influenza.  EKG nonischemic.  Labs  pending.  Chest x-ray pending.  Doubt ACS, dissection.  Does have a history of previous PE but is not tachycardic, tachypneic or hypoxic today.  ED PROGRESS  Patient's work-up today is unremarkable.  Normal troponin.  Normal EKG.  Clear chest x-ray.  Given symptoms ongoing for hours, do not feel he needs serial troponins.  At this time he is medically cleared and awaiting psychiatric evaluation for further disposition.  COVID, flu negative.  I reviewed all nursing notes and pertinent previous records as available.  I have reviewed and interpreted any EKGs, lab and urine results, imaging (as available).  ____________________________________________   FINAL CLINICAL IMPRESSION(S) / ED DIAGNOSES  Final diagnoses:  Atypical chest pain  Homicidal ideation     ED Discharge Orders     None       *Please note:  FOX SALMINEN was evaluated in Emergency Department on 10/10/2020 for the symptoms described in the history of present illness. He was evaluated in the context of the global COVID-19 pandemic, which necessitated consideration that the patient might be at risk for infection with the SARS-CoV-2 virus that causes COVID-19. Institutional protocols and algorithms that pertain to the evaluation of patients at risk for COVID-19 are in a state of rapid change based on information released by regulatory bodies including the CDC and federal and state organizations. These policies and algorithms were followed during the patient's care in the ED.  Some ED evaluations and interventions may be delayed as a result of limited staffing during and the pandemic.*   Note:  This document was prepared using Dragon voice recognition software and may include unintentional dictation errors.    Raelene Trew, Layla Maw, DO 10/10/20 [phone]

## 2020-10-09 NOTE — ED Triage Notes (Signed)
Pt presents to ER c/o HA, and chest pressure that started yesterday.  Pt states CP and HA become worse with irritation.  Pt states he has been more irritated than normal lately.  Pt states he has had thoughts of hurting others recently, but nobody in particular.  Pt denies SI.

## 2020-10-09 NOTE — ED Notes (Signed)
Pt belongings Pink t shirt  Blue jeans  White tennis shoes Engineer, maintenance  Cell phone  Black backpack

## 2020-10-10 DIAGNOSIS — F250 Schizoaffective disorder, bipolar type: Secondary | ICD-10-CM

## 2020-10-10 DIAGNOSIS — R0789 Other chest pain: Secondary | ICD-10-CM | POA: Diagnosis not present

## 2020-10-10 LAB — COMPREHENSIVE METABOLIC PANEL
ALT: 25 U/L (ref 0–44)
AST: 28 U/L (ref 15–41)
Albumin: 3.7 g/dL (ref 3.5–5.0)
Alkaline Phosphatase: 62 U/L (ref 38–126)
Anion gap: 9 (ref 5–15)
BUN: 14 mg/dL (ref 6–20)
CO2: 23 mmol/L (ref 22–32)
Calcium: 8.5 mg/dL — ABNORMAL LOW (ref 8.9–10.3)
Chloride: 109 mmol/L (ref 98–111)
Creatinine, Ser: 1.11 mg/dL (ref 0.61–1.24)
GFR, Estimated: >60 mL/min (ref >60)
Glucose, Bld: 129 mg/dL — ABNORMAL HIGH (ref 70–99)
Potassium: 3.2 mmol/L — ABNORMAL LOW (ref 3.5–5.1)
Sodium: 141 mmol/L (ref 135–145)
Total Bilirubin: 1.2 mg/dL (ref 0.3–1.2)
Total Protein: 6.9 g/dL (ref 6.5–8.1)

## 2020-10-10 LAB — RESP PANEL BY RT-PCR (FLU A&B, COVID) ARPGX2
Influenza A by PCR: NEGATIVE
Influenza B by PCR: NEGATIVE
SARS Coronavirus 2 by RT PCR: NEGATIVE

## 2020-10-10 LAB — TROPONIN I (HIGH SENSITIVITY): Troponin I (High Sensitivity): 4 ng/L (ref <18)

## 2020-10-10 MED ORDER — ARIPIPRAZOLE ER 400 MG IM SRER
400.0000 mg | INTRAMUSCULAR | Status: DC
  Administered 2020-10-10: 400 mg via INTRAMUSCULAR
  Filled 2020-10-10: qty 2

## 2020-10-10 MED ORDER — ARIPIPRAZOLE ER 400 MG IM SRER: 400.0000 mg | INTRAMUSCULAR | 1 refills | Status: DC

## 2020-10-10 MED ORDER — ARIPIPRAZOLE 10 MG PO TABS: 10.0000 mg | ORAL_TABLET | Freq: Every day | ORAL | 1 refills | Status: DC

## 2020-10-10 NOTE — ED Notes (Signed)
Dr. Clapacs and [name] at bedside. 

## 2020-10-10 NOTE — ED Notes (Signed)
Pt in shower at this time

## 2020-10-10 NOTE — Discharge Instructions (Addendum)
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
## 2020-10-10 NOTE — BH Assessment (Signed)
[name] and Dr. Weber Cooks met with patient for reassessment. Patient reports he no longer has a headache or chest pain and although he has been mostly irritable, he has no plans of hurting anyone. Patient did mention that his irritability stems from "people who owe" him money. Later during the interview, patient names celebrity artists such as Sherrell Puller and [employer] that have yet to pay him. 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 Patient endorses some psychosis but is able to care for himself. Patient states he has not taken medication in quite some time and agreed to comply with recommendations from Dr. Weber Cooks. Patient denies SI/HI. Per Dr. Weber Cooks, patient does not meet criteria for inpatient psychiatric admission.

## 2020-10-10 NOTE — ED Notes (Addendum)
[name] speaking with patient. 

## 2020-10-10 NOTE — ED Notes (Signed)
After discharge pt handed RN Annette Stable his discharge papers and prescription and stated, "I don't need this."

## 2020-10-10 NOTE — ED Notes (Signed)
Breakfast tray given. VS assess. Shower offered. No other needs found at this moment. 

## 2020-10-10 NOTE — ED Notes (Signed)
Pt is asleep vs will assess when pt is awake.  

## 2020-10-10 NOTE — BH Assessment (Signed)
Comprehensive Clinical Assessment (CCA) Note  10/10/2020 Albert Miranda [id number]  Chief Complaint: Patient is a 40 year old male presenting to [facility] ED voluntarily. Per triage note Pt presents to ER c/o HA, and chest pressure that started yesterday.  Pt states CP and HA become worse with irritation.  Pt states he has been more irritated than normal lately.  Pt states he has had thoughts of hurting others recently, but nobody in particular.  Pt denies SI. During assessment patient was difficult to arouse but appears alert and oriented x4, patient reports "I had feelings like I wanted to hurt someone" but currently denies any HI. Patient reports that he is currently homeless and is not currently engaged with a therapist or a psychiatrist. Patient reports using Marijuana today but unable to report how often he uses marijuana. Patient denies SI/HI/AH/VH and does not appear to be responding to any internal or external stimuli.  Disposition pending Chief Complaint  Patient presents with   Chest Pain   Headache   Homicidal   Visit Diagnosis: Bipolar affective disorder by hx    CCA Screening, Triage and Referral (STR)  Patient Reported Information How did you hear about [place]? Self  Referral name: Self  Referral phone number: No data recorded  Whom do you see for routine medical problems? I don't have a doctor  Practice/Facility Name: No data recorded Practice/Facility Phone Number: No data recorded Name of Contact: No data recorded Contact Number: No data recorded Contact Fax Number: No data recorded Prescriber Name: No data recorded Prescriber Address (if known): No data recorded  What Is the Reason for Your Visit/Call Today? Patient presents voluntarily due to HI  How Long Has This Been Causing You Problems? 1 wk - 1 month  What Do You Feel Would Help You the Most Today? -- (Pt unable to identify needs)   Have You Recently Been in Any Inpatient Treatment (Hospital/Detox/Crisis  Center/28-Day Program)? No  Name/Location of Program/Hospital:No data recorded How Long Were You There? No data recorded When Were You Discharged? No data recorded  Have You Ever Received Services From [facility] Before? No  Who Do You See at [facility]? No data recorded  Have You Recently Had Any Thoughts About Hurting Yourself? No  Are You Planning to Commit Suicide/Harm Yourself At This time? No   Have you Recently Had Thoughts About Hurting Someone [name]? No  Explanation: No data recorded  Have You Used Any Alcohol or Drugs in the Past 24 Hours? Yes  How Long Ago Did You Use Drugs or Alcohol? No data recorded What Did You Use and How Much? Marijuana   Do You Currently Have a Therapist/Psychiatrist? No  Name of Therapist/Psychiatrist: No data recorded  Have You Been Recently Discharged From Any Office Practice or Programs? No  Explanation of Discharge From Practice/Program: No data recorded    CCA Screening Triage Referral Assessment Type of Contact: Face-to-Face  Is this Initial or Reassessment? No data recorded Date Telepsych consult ordered in [facility]:  No data recorded Time Telepsych consult ordered in [facility]:  No data recorded  Patient Reported Information Reviewed? Yes  Patient Left Without Being Seen? No data recorded Reason for Not Completing Assessment: No data recorded  Collateral Involvement: No data recorded  Does Patient Have a Court Appointed Legal Guardian? No data recorded Name and Contact of Legal Guardian: No data recorded If Minor and Not Living with Parent(s), Who has Custody? No data recorded Is CPS involved or ever been involved?  Never  Is APS involved or ever been involved? Never   Patient Determined To Be At Risk for Harm To Self or Others Based on Review of Patient Reported Information or Presenting Complaint? No  Method: No data recorded Availability of Means: No data recorded Intent: No data recorded Notification Required: No data  recorded Additional Information for Danger to Others Potential: No data recorded Additional Comments for Danger to Others Potential: No data recorded Are There Guns or Other Weapons in Your Home? No data recorded Types of Guns/Weapons: No data recorded Are These Weapons Safely Secured?                            No data recorded Who Could Verify You Are Able To Have These Secured: No data recorded Do You Have any Outstanding Charges, Pending Court Dates, Parole/Probation? No data recorded Contacted To Inform of Risk of Harm To Self or Others: No data recorded  Location of Assessment: [facility] ED   Does Patient Present under Involuntary Commitment? No  IVC Papers Initial File Date: No data recorded  [place] of Residence: [place]   Patient Currently Receiving the Following Services: Not Receiving Services   Determination of Need: Emergent (2 hours)   Options For Referral: -- (Overnight observation and reassessment in the AM.)     CCA Biopsychosocial Intake/Chief Complaint:  Psychiatric Evaluation  Current Symptoms/Problems: Medication noncompliance   Patient Reported Schizophrenia/Schizoaffective Diagnosis in Past: Yes   Strengths: Patient is able to communicate  Preferences: None noted  Abilities: Asks for help   Type of Services Patient Feels are Needed: Medication management   Initial Clinical Notes/Concerns: No data recorded  Mental Health Symptoms Depression:   None   Duration of Depressive symptoms: No data recorded  Mania:   None   Anxiety:    None   Psychosis:   None   Duration of Psychotic symptoms: No data recorded  Trauma:   None   Obsessions:   None   Compulsions:   None   Inattention:   None   Hyperactivity/Impulsivity:   None   Oppositional/Defiant Behaviors:   None   Emotional Irregularity:   None   Other Mood/Personality Symptoms:  No data recorded   Mental Status Exam Appearance and self-care  Stature:   Average    Weight:   Average weight   Clothing:   Casual   Grooming:   Normal   Cosmetic use:   None   Posture/gait:   Normal   Motor activity:   Not Remarkable   Sensorium  Attention:   Normal   Concentration:   Normal   Orientation:   X5   Recall/memory:   Normal   Affect and Mood  Affect:   Appropriate   Mood:   Other (Comment)   Relating  Eye contact:   Avoided   Facial expression:   Responsive   Attitude toward examiner:   Cooperative   Thought and Language  Speech flow:  Clear and Coherent   Thought content:   Appropriate to Mood and Circumstances   Preoccupation:   None   Hallucinations:   None   Organization:  No data recorded  [employer] of Knowledge:   Fair   Intelligence:   Average   Abstraction:   Normal   Judgement:   Fair   Dance movement psychotherapist:   Realistic   Insight:   Good   Decision Making:   Normal  Social Functioning  Social Maturity:   Responsible   Social Judgement:   Normal   Stress  Stressors:   Housing; Office manager Ability:   Normal   Skill Deficits:   None   Supports:   Support needed     Religion: Religion/Spirituality Are You A Religious Person?: No  Leisure/Recreation:    Exercise/Diet: Exercise/Diet Do You Exercise?: No Have You Gained or Lost A Significant Amount of Weight in the Past Six Months?: No Do You Follow a Special Diet?: No Do You Have Any Trouble Sleeping?: No   CCA Employment/Education Employment/Work Situation: Employment / Work Systems developer: On disability Why is Patient on Disability: Unknown How Long has Patient Been on Disability: Unknown Patient's Job has Been Impacted by Current Illness: No Has Patient ever Been in the [employer]?: No  Education: Education Did Theme park manager?: No Did You Have An Individualized Education Program (IIEP): No Did You Have Any Difficulty At School?: No Patient's Education Has  Been Impacted by Current Illness: No   CCA Family/Childhood History Family and Relationship History: Family history Marital status: Single  Childhood History:  Childhood History Did patient suffer any verbal/emotional/physical/sexual abuse as a child?: No Did patient suffer from severe childhood neglect?: No Has patient ever been sexually abused/assaulted/raped as an adolescent or adult?: No Was the patient ever a victim of a crime or a disaster?: No Witnessed domestic violence?: No Has patient been affected by domestic violence as an adult?: No  Child/Adolescent Assessment:     CCA Substance Use Alcohol/Drug Use: Alcohol / Drug Use Pain Medications: See MAR Prescriptions: See MAR Over the Counter: See MAR History of alcohol / drug use?: Yes Substance #1 Name of Substance 1: Marijuana                       ASAM's:  Six Dimensions of Multidimensional Assessment  Dimension 1:  Acute Intoxication and/or Withdrawal Potential:      Dimension 2:  Biomedical Conditions and Complications:      Dimension 3:  Emotional, Behavioral, or Cognitive Conditions and Complications:     Dimension 4:  Readiness to Change:     Dimension 5:  Relapse, Continued use, or Continued Problem Potential:     Dimension 6:  Recovery/Living Environment:     ASAM Severity Score:    ASAM Recommended Level of Treatment:     Substance use Disorder (SUD)    Recommendations for Services/Supports/Treatments:  Pending disposition  DSM5 Diagnoses: Patient Active Problem List   Diagnosis Date Noted   Bipolar affective disorder, currently depressed, moderate (HCC)    Marijuana use    Acute pulmonary embolism (HCC) 01/19/2019   Herpes 02/20/2017   Penile lesion 02/09/2017   Type 2 diabetes mellitus with hyperglycemia, without long-term current use of insulin (HCC) 02/09/2017   Hyperglycemia 10/23/2016   Schizoaffective disorder (HCC) 05/01/2016   Cannabis abuse 05/01/2016   Noncompliance  05/01/2016    Patient Centered Plan: Patient is on the following Treatment Plan(s):  Impulse Control   Referrals to Alternative Service(s): Referred to Alternative Service(s):   Place:   Date:   Time:    Referred to Alternative Service(s):   Place:   Date:   Time:    Referred to Alternative Service(s):   Place:   Date:   Time:    Referred to Alternative Service(s):   Place:   Date:   Time:     Albert Miranda, LCAS-A

## 2020-10-10 NOTE — ED Notes (Signed)
Pt discharged home.  VS stable. Belongings (including backpack) returned to patient.  Discharge instructions reviewed with patient.

## 2020-10-11 ENCOUNTER — Encounter: Payer: Self-pay | Admitting: Emergency Medicine

## 2020-10-11 ENCOUNTER — Emergency Department: Payer: Medicare Other

## 2020-10-11 ENCOUNTER — Emergency Department (EMERGENCY_DEPARTMENT_HOSPITAL)
Admission: EM | Admit: 2020-10-11 | Discharge: 2020-10-14 | Disposition: A | Discharge status: Other Acute Inpatient Hospital | Payer: Medicare Other | Source: Home / Self Care | Attending: Emergency Medicine | Admitting: Emergency Medicine

## 2020-10-11 ENCOUNTER — Emergency Department
Admission: EM | Admit: 2020-10-11 | Discharge: 2020-10-11 | Disposition: A | Discharge status: Home / Self Care | Payer: Medicare Other | Source: Home / Self Care | Attending: Emergency Medicine | Admitting: Emergency Medicine

## 2020-10-11 ENCOUNTER — Emergency Department
Admission: EM | Admit: 2020-10-11 | Discharge: 2020-10-11 | Disposition: A | Discharge status: Home / Self Care | Payer: Medicare Other | Attending: Emergency Medicine | Admitting: Emergency Medicine

## 2020-10-11 ENCOUNTER — Other Ambulatory Visit: Payer: Self-pay

## 2020-10-11 DIAGNOSIS — Z5900 Homelessness unspecified: Secondary | ICD-10-CM | POA: Insufficient documentation

## 2020-10-11 DIAGNOSIS — S0083XA Contusion of other part of head, initial encounter: Secondary | ICD-10-CM | POA: Insufficient documentation

## 2020-10-11 DIAGNOSIS — E1165 Type 2 diabetes mellitus with hyperglycemia: Secondary | ICD-10-CM | POA: Diagnosis present

## 2020-10-11 DIAGNOSIS — F259 Schizoaffective disorder, unspecified: Secondary | ICD-10-CM | POA: Insufficient documentation

## 2020-10-11 DIAGNOSIS — R45850 Homicidal ideations: Secondary | ICD-10-CM

## 2020-10-11 DIAGNOSIS — R519 Headache, unspecified: Secondary | ICD-10-CM | POA: Insufficient documentation

## 2020-10-11 DIAGNOSIS — F17210 Nicotine dependence, cigarettes, uncomplicated: Secondary | ICD-10-CM | POA: Insufficient documentation

## 2020-10-11 DIAGNOSIS — Z20822 Contact with and (suspected) exposure to covid-19: Secondary | ICD-10-CM | POA: Insufficient documentation

## 2020-10-11 DIAGNOSIS — R51 Headache with orthostatic component, not elsewhere classified: Secondary | ICD-10-CM | POA: Insufficient documentation

## 2020-10-11 DIAGNOSIS — Z9119 Patient's noncompliance with other medical treatment and regimen: Secondary | ICD-10-CM

## 2020-10-11 DIAGNOSIS — E119 Type 2 diabetes mellitus without complications: Secondary | ICD-10-CM | POA: Insufficient documentation

## 2020-10-11 DIAGNOSIS — R079 Chest pain, unspecified: Secondary | ICD-10-CM | POA: Insufficient documentation

## 2020-10-11 DIAGNOSIS — E860 Dehydration: Secondary | ICD-10-CM | POA: Insufficient documentation

## 2020-10-11 DIAGNOSIS — Z5321 Procedure and treatment not carried out due to patient leaving prior to being seen by health care provider: Secondary | ICD-10-CM | POA: Insufficient documentation

## 2020-10-11 DIAGNOSIS — Z91199 Patient's noncompliance with other medical treatment and regimen due to unspecified reason: Secondary | ICD-10-CM

## 2020-10-11 DIAGNOSIS — F1210 Cannabis abuse, uncomplicated: Secondary | ICD-10-CM | POA: Diagnosis present

## 2020-10-11 DIAGNOSIS — F250 Schizoaffective disorder, bipolar type: Secondary | ICD-10-CM | POA: Diagnosis not present

## 2020-10-11 LAB — COMPREHENSIVE METABOLIC PANEL
ALT: 22 U/L (ref 0–44)
ALT: 24 U/L (ref 0–44)
AST: 24 U/L (ref 15–41)
AST: 29 U/L (ref 15–41)
Albumin: 3.9 g/dL (ref 3.5–5.0)
Albumin: 3.9 g/dL (ref 3.5–5.0)
Alkaline Phosphatase: 69 U/L (ref 38–126)
Alkaline Phosphatase: 62 U/L (ref 38–126)
Anion gap: 7 (ref 5–15)
Anion gap: 5 (ref 5–15)
BUN: 16 mg/dL (ref 6–20)
BUN: 13 mg/dL (ref 6–20)
CO2: 25 mmol/L (ref 22–32)
CO2: 27 mmol/L (ref 22–32)
Calcium: 8.8 mg/dL — ABNORMAL LOW (ref 8.9–10.3)
Calcium: 8.7 mg/dL — ABNORMAL LOW (ref 8.9–10.3)
Chloride: 111 mmol/L (ref 98–111)
Chloride: 109 mmol/L (ref 98–111)
Creatinine, Ser: 1.24 mg/dL (ref 0.61–1.24)
Creatinine, Ser: 1.12 mg/dL (ref 0.61–1.24)
GFR, Estimated: >60 mL/min (ref >60)
GFR, Estimated: >60 mL/min (ref >60)
Glucose, Bld: 96 mg/dL (ref 70–99)
Glucose, Bld: 123 mg/dL — ABNORMAL HIGH (ref 70–99)
Potassium: 3.5 mmol/L (ref 3.5–5.1)
Potassium: 3.1 mmol/L — ABNORMAL LOW (ref 3.5–5.1)
Sodium: 143 mmol/L (ref 135–145)
Sodium: 141 mmol/L (ref 135–145)
Total Bilirubin: 0.8 mg/dL (ref 0.3–1.2)
Total Bilirubin: 1.2 mg/dL (ref 0.3–1.2)
Total Protein: 7.3 g/dL (ref 6.5–8.1)
Total Protein: 7 g/dL (ref 6.5–8.1)

## 2020-10-11 LAB — URINALYSIS, COMPLETE (UACMP) WITH MICROSCOPIC
Bacteria, UA: NONE SEEN
Bilirubin Urine: NEGATIVE
Glucose, UA: NEGATIVE mg/dL
Hgb urine dipstick: NEGATIVE
Ketones, ur: NEGATIVE mg/dL
Leukocytes,Ua: NEGATIVE
Nitrite: NEGATIVE
Protein, ur: NEGATIVE mg/dL
Specific Gravity, Urine: 1.023 (ref 1.005–1.030)
pH: 5 (ref 5.0–8.0)

## 2020-10-11 LAB — CBC WITH DIFFERENTIAL/PLATELET
Abs Immature Granulocytes: 0.01 10*3/uL (ref 0.00–0.07)
Abs Immature Granulocytes: 0.01 10*3/uL (ref 0.00–0.07)
Basophils Absolute: 0 10*3/uL (ref 0.0–0.1)
Basophils Absolute: 0 10*3/uL (ref 0.0–0.1)
Basophils Relative: 0 %
Basophils Relative: 1 %
Eosinophils Absolute: 0 10*3/uL (ref 0.0–0.5)
Eosinophils Absolute: 0.1 10*3/uL (ref 0.0–0.5)
Eosinophils Relative: 1 %
Eosinophils Relative: 1 %
HCT: 41.2 % (ref 39.0–52.0)
HCT: 42 % (ref 39.0–52.0)
Hemoglobin: 14.4 g/dL (ref 13.0–17.0)
Hemoglobin: 14.4 g/dL (ref 13.0–17.0)
Immature Granulocytes: 0 %
Immature Granulocytes: 0 %
Lymphocytes Relative: 25 %
Lymphocytes Relative: 38 %
Lymphs Abs: 1.6 10*3/uL (ref 0.7–4.0)
Lymphs Abs: 2.4 10*3/uL (ref 0.7–4.0)
MCH: 31.6 pg (ref 26.0–34.0)
MCH: 31.9 pg (ref 26.0–34.0)
MCHC: 34.3 g/dL (ref 30.0–36.0)
MCHC: 35 g/dL (ref 30.0–36.0)
MCV: 91.2 fL (ref 80.0–100.0)
MCV: 92.3 fL (ref 80.0–100.0)
Monocytes Absolute: 0.4 10*3/uL (ref 0.1–1.0)
Monocytes Absolute: 0.4 10*3/uL (ref 0.1–1.0)
Monocytes Relative: 6 %
Monocytes Relative: 6 %
Neutro Abs: 3.4 10*3/uL (ref 1.7–7.7)
Neutro Abs: 4.3 10*3/uL (ref 1.7–7.7)
Neutrophils Relative %: 54 %
Neutrophils Relative %: 68 %
Platelets: 222 10*3/uL (ref 150–400)
Platelets: 233 10*3/uL (ref 150–400)
RBC: 4.52 MIL/uL (ref 4.22–5.81)
RBC: 4.55 MIL/uL (ref 4.22–5.81)
RDW: 13.4 % (ref 11.5–15.5)
RDW: 13.5 % (ref 11.5–15.5)
WBC: 6.3 10*3/uL (ref 4.0–10.5)
WBC: 6.4 10*3/uL (ref 4.0–10.5)
nRBC: 0 % (ref 0.0–0.2)
nRBC: 0 % (ref 0.0–0.2)

## 2020-10-11 LAB — RESP PANEL BY RT-PCR (FLU A&B, COVID) ARPGX2
Influenza A by PCR: NEGATIVE
Influenza B by PCR: NEGATIVE
SARS Coronavirus 2 by RT PCR: NEGATIVE

## 2020-10-11 LAB — CK: Total CK: 698 U/L — ABNORMAL HIGH (ref 49–397)

## 2020-10-11 LAB — ETHANOL: Alcohol, Ethyl (B): <10 mg/dL (ref <10)

## 2020-10-11 NOTE — ED Notes (Signed)
Patient dressed out in hospital attire and socks.

## 2020-10-11 NOTE — ED Notes (Signed)
Pt given cup of gingerale °

## 2020-10-11 NOTE — ED Provider Notes (Signed)
[facility] Emergency Department Provider Note   ____________________________________________    I have reviewed the triage vital signs and the nursing notes.   HISTORY  Chief Complaint Dehydration     HPI Albert Miranda is a 40 y.o. male who reports he is homeless and is feeling dehydrated.  He is requesting something to drink and eat, no physical complaints at this time besides being thirsty  Past Medical History:  Diagnosis Date   Bipolar 1 disorder (HCC)    Diabetes mellitus without complication (HCC)    A1C 10.1 01/2017    Herpes    Hyperglycemia    Patient denies medical problems     Patient Active Problem List   Diagnosis Date Noted   Marijuana use    Acute pulmonary embolism (HCC) 01/19/2019   Herpes 02/20/2017   Penile lesion 02/09/2017   Type 2 diabetes mellitus with hyperglycemia, without long-term current use of insulin (HCC) 02/09/2017   Hyperglycemia 10/23/2016   Schizoaffective disorder (HCC) 05/01/2016   Cannabis abuse 05/01/2016   Noncompliance 05/01/2016    Past Surgical History:  Procedure Laterality Date   INCISION AND DRAINAGE ABSCESS     buttock gluteal region    LEG SURGERY     PULMONARY THROMBECTOMY N/A 01/20/2019   Procedure: PULMONARY THROMBECTOMY, LEFT LOWER EXTREMITY VENOUS LYSIS, IVF FILTER INSERTION;  Surgeon: Annice Needy, MD;  Location: [facility] INVASIVE CV LAB;  Service: Cardiovascular;  Laterality: N/A;    Prior to Admission medications   Medication Sig Start Date End Date Taking? Authorizing Provider  ARIPiprazole (ABILIFY) 10 MG tablet Take 1 tablet (10 mg total) by mouth daily. 10/10/20   Clapacs, Jackquline Denmark, MD  ARIPiprazole ER (ABILIFY MAINTENA) 400 MG SRER injection Inject 2 mLs (400 mg total) into the muscle every 28 (twenty-eight) days. 10/31/20   Clapacs, Jackquline Denmark, MD  OLANZapine (ZYPREXA) 5 MG tablet Take 1 tablet (5 mg total) by mouth at bedtime. 07/17/20   Reggie Pile, MD  Sunscreens [facility] DAILY  CARE LIP Luciano Cutter) OINT Please use as needed. 07/22/20   Minna Antis, MD     Allergies Patient has no known allergies.  Family History  Problem Relation Age of Onset   Diabetes Father        dx age 40    Depression Mother     Social History Social History   Tobacco Use   Smoking status: Every Day    Packs/day: 1.00    Types: Cigarettes   Smokeless tobacco: Never   Tobacco comments:    1-1.5 ppd since since age 38 as of 02/07/17 does not want to quit  Vaping Use   Vaping Use: Never used  Substance Use Topics   Alcohol use: Yes    Comment: occassional   Drug use: Yes    Types: Marijuana    Comment: 1-2 x per month     Review of Systems  Constitutional: No fever/chills  ENT: No sore throat.   Gastrointestinal: No abdominal pain.  No nausea, no vomiting.   Genitourinary: Negative for dysuria. Musculoskeletal: Negative for back pain. Skin: Negative for rash. Neurological: Negative for headaches     ____________________________________________   PHYSICAL EXAM:  VITAL SIGNS: ED Triage Vitals  Enc Vitals Group     BP 10/11/20 1549 122/83     Pulse Rate 10/11/20 1549 93     Resp 10/11/20 1549 18     Temp 10/11/20 1549 98 F (36.7 C)  Temp Source 10/11/20 1549 Oral     SpO2 10/11/20 1549 98 %     Weight 10/11/20 1550 92 kg (202 lb 13.2 oz)     Height 10/11/20 1550 1.803 m (5\' 11" )     Head Circumference --      Peak Flow --      Pain Score 10/11/20 1549 0     Pain Loc --      Pain Edu? --      Excl. in GC? --      Constitutional: Alert and oriented. No acute distress. Pleasant and interactive Eyes: Conjunctivae are normal.  Head: Atraumatic. Nose: No congestion/rhinnorhea. Mouth/Throat: Mucous membranes are moist.   Cardiovascular: Normal rate, regular rhythm.  Respiratory: Normal respiratory effort.  No retractions. Genitourinary: deferred Musculoskeletal: No lower extremity tenderness nor edema.   Neurologic:  Normal speech and language.  No gross focal neurologic deficits are appreciated.   Skin:  Skin is warm, dry and intact. No rash noted.   ____________________________________________   LABS (all labs ordered are listed, but only abnormal results are displayed)  Labs Reviewed - No data to display ____________________________________________  EKG   ____________________________________________  RADIOLOGY  ____________________________________________   PROCEDURES  Procedure(s) performed: No  Procedures   Critical Care performed: No ____________________________________________   INITIAL IMPRESSION / ASSESSMENT AND PLAN / ED COURSE  Pertinent labs & imaging results that were available during my care of the patient were reviewed by me and considered in my medical decision making (see chart for details).   Patient well-appearing and in no acute distress, vital signs reassuring, food and drink provided   ____________________________________________   FINAL CLINICAL IMPRESSION(S) / ED DIAGNOSES  Final diagnoses:  Dehydration, mild      NEW MEDICATIONS STARTED DURING THIS VISIT:  Discharge Medication List as of 10/11/2020  4:41 PM       Note:  This document was prepared using Dragon voice recognition software and may include unintentional dictation errors.    10/13/2020, MD 10/11/20 [phone]

## 2020-10-11 NOTE — ED Triage Notes (Signed)
States he is dehydrated from being outside. Homeless.

## 2020-10-11 NOTE — ED Triage Notes (Addendum)
Patient observed to be washing hair and taking a bath in mens bathroom.
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
## 2020-10-11 NOTE — ED Notes (Addendum)
Belongings bagged: Shorts, white tshirt, socks, tennis shoes, wallet with no cash, several cards and an ID. One earring bagged as well.

## 2020-10-11 NOTE — ED Notes (Signed)
Belonging bagged by Kendal Hymen, ED NT. This RN witness.

## 2020-10-11 NOTE — ED Notes (Signed)
Pt has not returned

## 2020-10-11 NOTE — ED Triage Notes (Signed)
Patient presents with [employer] officer reportedly IVC for homicidal ideations. Patient reports "wanting to kill a bunch of people." Patient reports specific plan, but refused to share plan with this RN. Patient denies SI. Officer remains with patient at this time. Patient currently cooperative and pleasant.

## 2020-10-11 NOTE — ED Triage Notes (Addendum)
Patient ambulatory to triage with steady gait, without difficulty or distress noted; pt reports that he is here for dehydration; c/o "dry mouth, skin dry"; denies any recent illness; pt seen yesterday for c/o CP and received psych eval as well; pt reports that he is currently homeless and "just wants a shower to help with dry skin"

## 2020-10-11 NOTE — ED Notes (Signed)
Pt noted leaving ED lobby 

## 2020-10-12 DIAGNOSIS — F250 Schizoaffective disorder, bipolar type: Secondary | ICD-10-CM

## 2020-10-12 DIAGNOSIS — E860 Dehydration: Secondary | ICD-10-CM | POA: Diagnosis not present

## 2020-10-12 LAB — URINE DRUG SCREEN, QUALITATIVE (ARMC ONLY)
Amphetamines, Ur Screen: NOT DETECTED
Barbiturates, Ur Screen: NOT DETECTED
Benzodiazepine, Ur Scrn: NOT DETECTED
Cannabinoid 50 Ng, Ur ~~LOC~~: NOT DETECTED
Cocaine Metabolite,Ur ~~LOC~~: NOT DETECTED
MDMA (Ecstasy)Ur Screen: NOT DETECTED
Methadone Scn, Ur: NOT DETECTED
Opiate, Ur Screen: NOT DETECTED
Phencyclidine (PCP) Ur S: NOT DETECTED
Tricyclic, Ur Screen: NOT DETECTED

## 2020-10-12 LAB — LIPID PANEL
Cholesterol: 126 mg/dL (ref 0–200)
HDL: 38 mg/dL — ABNORMAL LOW (ref >40)
LDL Cholesterol: 77 mg/dL (ref 0–99)
Total CHOL/HDL Ratio: 3.3 RATIO
Triglycerides: 57 mg/dL (ref <150)
VLDL: 11 mg/dL (ref 0–40)

## 2020-10-12 LAB — HEMOGLOBIN A1C
Hgb A1c MFr Bld: 6.9 % — ABNORMAL HIGH (ref 4.8–5.6)
Mean Plasma Glucose: 151.33 mg/dL

## 2020-10-12 MED ORDER — ARIPIPRAZOLE 10 MG PO TABS
30.0000 mg | ORAL_TABLET | Freq: Every day | ORAL | Status: DC
  Administered 2020-10-12 – 2020-10-14 (×3): 30 mg via ORAL
  Filled 2020-10-12 (×3): qty 3

## 2020-10-12 MED ORDER — DIVALPROEX SODIUM 500 MG PO DR TAB
500.0000 mg | DELAYED_RELEASE_TABLET | Freq: Two times a day (BID) | ORAL | Status: DC
  Administered 2020-10-12 – 2020-10-14 (×5): 500 mg via ORAL
  Filled 2020-10-12 (×5): qty 1

## 2020-10-12 NOTE — ED Notes (Signed)
Pt given dinner tray and juice at this time. 

## 2020-10-12 NOTE — ED Notes (Signed)
Hourly rounding performed, patient currently asleep in room. Patient has no complaints at this time. Q15 minute rounds and monitoring via Security Cameras to continue. 

## 2020-10-12 NOTE — ED Notes (Signed)
Pt asleep at this time, unable to collect vitals. Will collect pt vitals once awake. 

## 2020-10-12 NOTE — BH Assessment (Signed)
Comprehensive Clinical Assessment (CCA) Note  10/12/2020 Albert Miranda [id number] Recommendations for Services/Supports/Treatments: Psych NP Madaline Brilliant. pt is recommended for inpatient hospitalization.  Pt seen with "I had to come back here, I threatened to kill somebody so they wanted me to come her and talk to somebody." Pt presented with blocked speech. Pt's thoughts were clear and linear. Pt was responding to internal stimuli. Pt explained that he was pleased with his voices as the voices are on his team. Pt avoided making eye contact. Pt had a slightly irritable mood and responsive affect. Pt reported that he'd gotten into an argument with people at [facility]. Pt explained that he used to be connected to an ACT Team through [facility], however he has not talked to anyone in months. Pt explained that he'd also been off his psych meds. Pt admitted to thoughts of HI, but immediately retracted the statement.  Pt had poor insight and judgement. The pt had grandiose thought processes, claiming that he intended to start a company that will be all over the [place]. The patient denied current SI.    Chief Complaint:  Chief Complaint  Patient presents with   Psychiatric Evaluation   Homicidal   Visit Diagnosis: Schizoaffective disorder (HCC)    CCA Screening, Triage and Referral (STR)  Patient Reported Information How did you hear about [place]? Self  Referral name: Self  Referral phone number: No data recorded  Whom do you see for routine medical problems? I don't have a doctor  Practice/Facility Name: No data recorded Practice/Facility Phone Number: No data recorded Name of Contact: No data recorded Contact Number: No data recorded Contact Fax Number: No data recorded Prescriber Name: No data recorded Prescriber Address (if known): No data recorded  What Is the Reason for Your Visit/Call Today? Patient presents involuntarily for thoughts of HI  How Long Has This Been Causing You Problems? 1 wk -  1 month  What Do You Feel Would Help You the Most Today? Treatment for Depression or other mood problem   Have You Recently Been in Any Inpatient Treatment (Hospital/Detox/Crisis Center/28-Day Program)? No  Name/Location of Program/Hospital:No data recorded How Long Were You There? No data recorded When Were You Discharged? No data recorded  Have You Ever Received Services From [facility] Before? No  Who Do You See at [facility]? No data recorded  Have You Recently Had Any Thoughts About Hurting Yourself? No  Are You Planning to Commit Suicide/Harm Yourself At This time? No   Have you Recently Had Thoughts About Hurting Someone Albert Miranda? Yes  Explanation: No data recorded  Have You Used Any Alcohol or Drugs in the Past 24 Hours? No  How Long Ago Did You Use Drugs or Alcohol? No data recorded What Did You Use and How Much? Pt denies substance use   Do You Currently Have a Therapist/Psychiatrist? No  Name of Therapist/Psychiatrist: No data recorded  Have You Been Recently Discharged From Any Office Practice or Programs? No  Explanation of Discharge From Practice/Program: No data recorded    CCA Screening Triage Referral Assessment Type of Contact: Face-to-Face  Is this Initial or Reassessment? No data recorded Date Telepsych consult ordered in [facility]:  No data recorded Time Telepsych consult ordered in [facility]:  No data recorded  Patient Reported Information Reviewed? Yes  Patient Left Without Being Seen? No data recorded Reason for Not Completing Assessment: No data recorded  Collateral Involvement: None   Does Patient Have a Court Appointed Legal Guardian? No data  recorded Name and Contact of Legal Guardian: No data recorded If Minor and Not Living with Parent(s), Who has Custody? No data recorded Is CPS involved or ever been involved? Never  Is APS involved or ever been involved? Never   Patient Determined To Be At Risk for Harm To Self or Others Based on Review  of Patient Reported Information or Presenting Complaint? No  Method: No data recorded Availability of Means: No data recorded Intent: No data recorded Notification Required: No data recorded Additional Information for Danger to Others Potential: No data recorded Additional Comments for Danger to Others Potential: No data recorded Are There Guns or Other Weapons in Your Home? No data recorded Types of Guns/Weapons: No data recorded Are These Weapons Safely Secured?                            No data recorded Who Could Verify You Are Able To Have These Secured: No data recorded Do You Have any Outstanding Charges, Pending Court Dates, Parole/Probation? No data recorded Contacted To Inform of Risk of Harm To Self or Others: No data recorded  Location of Assessment: [facility] ED   Does Patient Present under Involuntary Commitment? Yes  IVC Papers Initial File Date: 10/11/20   [place] of Residence: [place]   Patient Currently Receiving the Following Services: Not Receiving Services   Determination of Need: Emergent (2 hours)   Options For Referral: Inpatient Hospitalization     CCA Biopsychosocial Intake/Chief Complaint:  Psychiatric Evaluation  Current Symptoms/Problems: Medication noncompliance   Patient Reported Schizophrenia/Schizoaffective Diagnosis in Past: Yes   Strengths: Patient is able to communicate  Preferences: None noted  Abilities: Asks for help   Type of Services Patient Feels are Needed: Medication management   Initial Clinical Notes/Concerns: No data recorded  Mental Health Symptoms Depression:   None   Duration of Depressive symptoms: No data recorded  Mania:   None   Anxiety:    None   Psychosis:   Hallucinations   Duration of Psychotic symptoms:  Greater than six months   Trauma:   None   Obsessions:   None   Compulsions:   None   Inattention:   None   Hyperactivity/Impulsivity:   None   Oppositional/Defiant  Behaviors:   None   Emotional Irregularity:   None   Other Mood/Personality Symptoms:  No data recorded   Mental Status Exam Appearance and self-care  Stature:   Average   Weight:   Average weight   Clothing:   Casual   Grooming:   Normal   Cosmetic use:   None   Posture/gait:   Normal   Motor activity:   Not Remarkable   Sensorium  Attention:   Distractible   Concentration:   Variable   Orientation:   X5   Recall/memory:   Normal   Affect and Mood  Affect:   Appropriate   Mood:   Irritable   Relating  Eye contact:   None   Facial expression:   Responsive   Attitude toward examiner:   Cooperative   Thought and Language  Speech flow:  Clear and Coherent   Thought content:   Delusions   Preoccupation:   Homicidal   Hallucinations:   Auditory   Organization:  No data recorded  [employer] of Knowledge:   Fair   Intelligence:   Average   Abstraction:   Normal   Judgement:   Impaired  Reality Testing:   Distorted   Insight:   Lacking   Decision Making:   Impulsive   Social Functioning  Social Maturity:   Irresponsible; Impulsive   Social Judgement:   Heedless   Stress  Stressors:   Housing; Office manager Ability:   Normal   Skill Deficits:   Self-care   Supports:   Support needed     Religion: Religion/Spirituality Are You A Religious Person?: No  Leisure/Recreation:    Exercise/Diet: Exercise/Diet Do You Exercise?: No Have You Gained or Lost A Significant Amount of Weight in the Past Six Months?: No Do You Follow a Special Diet?: No Do You Have Any Trouble Sleeping?: No   CCA Employment/Education Employment/Work Situation: Employment / Work Systems developer: On disability Why is Patient on Disability: Unknown Patient's Job has Been Impacted by Current Illness: No Has Patient ever Been in the [employer]?: No  Education: Education Is Patient  Currently Attending School?: No Did Theme park manager?: No Did You Have An Individualized Education Program (IIEP): No Did You Have Any Difficulty At [employer]?: No Patient's Education Has Been Impacted by Current Illness: No   CCA Family/Childhood History Family and Relationship History: Family history Marital status: Single Does patient have children?: No  Childhood History:  Childhood History By whom was/is the patient raised?: Mother Did patient suffer any verbal/emotional/physical/sexual abuse as a child?: No Did patient suffer from severe childhood neglect?: No Has patient ever been sexually abused/assaulted/raped as an adolescent or adult?: No Was the patient ever a victim of a crime or a disaster?: No Witnessed domestic violence?: No Has patient been affected by domestic violence as an adult?: No  Child/Adolescent Assessment:     CCA Substance Use Alcohol/Drug Use: Alcohol / Drug Use Pain Medications: See MAR Prescriptions: See MAR Over the Counter: See MAR History of alcohol / drug use?: Yes Longest period of sobriety (when/how long): Unable to Assess Negative Consequences of Use: Personal relationships Withdrawal Symptoms: None Substance #1 Name of Substance 1: Marijuana                       ASAM's:  Six Dimensions of Multidimensional Assessment  Dimension 1:  Acute Intoxication and/or Withdrawal Potential:      Dimension 2:  Biomedical Conditions and Complications:      Dimension 3:  Emotional, Behavioral, or Cognitive Conditions and Complications:  Dimension 3:  Description of emotional, behavioral, or cognitive conditions and complications: Pt has a schizoaffective diagnosis  Dimension 4:  Readiness to Change:     Dimension 5:  Relapse, Continued use, or Continued Problem Potential:     Dimension 6:  Recovery/Living Environment:     ASAM Severity Score:    ASAM Recommended Level of Treatment:     Substance use Disorder (SUD) Substance Use  Disorder (SUD)  Checklist Symptoms of Substance Use: Continued use despite having a persistent/recurrent physical/psychological problem caused/exacerbated by use  Recommendations for Services/Supports/Treatments: Recommendations for Services/Supports/Treatments Recommendations For Services/Supports/Treatments: Inpatient Hospitalization  DSM5 Diagnoses: Patient Active Problem List   Diagnosis Date Noted   Marijuana use    Acute pulmonary embolism (HCC) 01/19/2019   Herpes 02/20/2017   Penile lesion 02/09/2017   Type 2 diabetes mellitus with hyperglycemia, without long-term current use of insulin (HCC) 02/09/2017   Hyperglycemia 10/23/2016   Schizoaffective disorder (HCC) 05/01/2016   Cannabis abuse 05/01/2016   Noncompliance 05/01/2016     Garnet Overfield R Tarika Mckethan, LCAS

## 2020-10-12 NOTE — ED Notes (Signed)
Patient transferred from ED to [facility] room 1 after screening for contraband. Report received from [place], RN including Situation, Background, Assessment and Recommendations. Pt oriented to unit including Q15 minute rounds as well as the security cameras for their protection. Patient is alert and oriented, warm and dry in no acute distress. Patient denies SI and VH. Pt. Encouraged to let this nurse know if needs arise.
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
## 2020-10-12 NOTE — ED Notes (Signed)
Pt is hesitant to answer some questions, reports HI but does not elaborate. Pt makes unusual remark when asked about AH, does not deny

## 2020-10-12 NOTE — ED Provider Notes (Signed)
Emergency Medicine Observation Re-evaluation Note  Albert Miranda is a 40 y.o. male, seen on rounds today.  Pt initially presented to the ED for complaints of Psychiatric Evaluation and Homicidal Currently, the patient is resting.  Physical Exam  BP 111/75   Pulse 78   Temp 98.3 F (36.8 C) (Oral)   Resp 18   Ht 5\' 11"  (1.803 m)   Wt 90 kg   SpO2 99%   BMI 27.67 kg/m  Physical Exam General: NAD Cardiac: well perfused Lungs: unlabored Psych: currently calm  ED Course / MDM  EKG:   I have reviewed the labs performed to date as well as medications administered while in observation.  Recent changes in the last 24 hours include none.  Plan  Current plan is for psych eval. Albert Miranda is under involuntary commitment.      [name], MD 10/12/20 0700

## 2020-10-12 NOTE — BH Assessment (Signed)
Referral information for Psychiatric Hospitalization faxed to:  [name] ([phone]-or- [phone]),   [facility] ([phone]---[phone]),  [facility] ([phone], [phone], [phone] or [phone]),   [facility] ([phone] or [phone])  [facility] ([phone]),   [facility] ([phone] -or- [phone]),   [facility] ([phone]).  [facility] ([phone])  
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
## 2020-10-13 DIAGNOSIS — E860 Dehydration: Secondary | ICD-10-CM | POA: Diagnosis not present

## 2020-10-13 NOTE — ED Notes (Signed)
Lunch meal tray given.  

## 2020-10-13 NOTE — ED Notes (Signed)
IVC pending placement 

## 2020-10-13 NOTE — ED Notes (Signed)
Dinner tray given

## 2020-10-13 NOTE — ED Provider Notes (Signed)
Emergency Medicine Observation Re-evaluation Note  Albert Miranda is a 40 y.o. male, seen on rounds today.  Pt initially presented to the ED for complaints of Psychiatric Evaluation and Homicidal  Currently, the patient is calm, no acute complaints.  Physical Exam  Blood pressure 113/76, pulse 81, temperature 98 F (36.7 C), temperature source Oral, resp. rate 18, height 5\' 11"  (1.803 m), weight 90 kg, SpO2 100 %. Physical Exam General: NAD Lungs: CTAB Psych: not agitated  ED Course / MDM  EKG:    I have reviewed the labs performed to date as well as medications administered while in observation.  Recent changes in the last 24 hours include no acute events overnight.  Seen by psychiatry yesterday, meds restarted  Plan  Current plan is for psych admission. Patient is under full IVC at this time.   , MD 10/13/20 [phone]

## 2020-10-13 NOTE — BH Assessment (Signed)
Referral information for Psychiatric Hospitalization faxed to;    [facility] [phone]), Unable to reach anyone.   [facility] [phone]), Left a message requesting a return phone call.   [facility] [phone], [phone], [phone] or [phone]), unable to reach anyone.   [facility] [phone] or [phone]), Left message requesting return phone call.   [place] [phone]), Unable to reach anyone.   [facility] [phone] -or- [phone]), Per the request, writer refaxed the information.  [facility] [phone]), unable to reach anyone.   [facility] [phone]), unable to reach anyone.

## 2020-10-13 NOTE — ED Notes (Signed)
Breakfast tray given. °

## 2020-10-13 NOTE — ED Notes (Signed)
Pt given snack per his request. Empty lunch boxes removed from room.

## 2020-10-13 NOTE — BH Assessment (Signed)
Referral check:    [facility] ([phone]-EX- [phone]), No answer   [facility] [phone]), No answer   [facility] [phone], [phone], [phone] or [phone]), No intake staff available until after 7am.   [facility] [phone] or [phone]) No answer   [facility] [phone]), No answer   [facility] [phone] -or- [phone]), Per Jamesetta So, there will be no beds available until [facility] 10/16/20.   [facility] [phone]). No answer   [facility] [phone]) Autumn agreed to have staff call back.

## 2020-10-14 DIAGNOSIS — E860 Dehydration: Secondary | ICD-10-CM | POA: Diagnosis not present

## 2020-10-14 LAB — CBG MONITORING, ED: Glucose-Capillary: 125 mg/dL — ABNORMAL HIGH (ref 70–99)

## 2020-10-14 NOTE — ED Notes (Signed)
Pt given dinner tray an a cup of juice. Pt finishing getting ready to transfer to [facility]

## 2020-10-14 NOTE — ED Notes (Signed)
IVC, pending placement 

## 2020-10-14 NOTE — ED Notes (Signed)
Rn informed pt he would be transferred to [facility].  Pt offered the phone to notified family / friends.  Pt declined.

## 2020-10-14 NOTE — ED Notes (Signed)
Gave Pt some snack and a cup of soda.

## 2020-10-14 NOTE — ED Provider Notes (Signed)
Emergency Medicine Observation Re-evaluation Note  Albert Miranda is a 40 y.o. male, seen on rounds today.  Pt initially presented to the ED for complaints of Psychiatric Evaluation and Homicidal Currently, the patient is resting calmly, no acute complaints.  Physical Exam  BP 125/80   Pulse (!) 56   Temp 97.7 F (36.5 C) (Oral)   Resp 18   Ht 5\' 11"  (1.803 m)   Wt 90 kg   SpO2 99%   BMI 27.67 kg/m  Physical Exam General: no acute distress Cardiac: slightly bradycardic Lungs: equal chest rise Psych: calm  ED Course / MDM  EKG:EKG Interpretation  Date/Time:  Thursday October 12 2020 14:00:58 EDT Ventricular Rate:  60 PR Interval:  118 QRS Duration: 74 QT Interval:  428 QTC Calculation: 428 R Axis:   39 Text Interpretation: Normal sinus rhythm Low voltage QRS Borderline ECG Confirmed by UNCONFIRMED, DOCTOR (05-29-1972), editor [id number], Tammy [phone]) on 10/13/2020 8:47:30 AM  I have reviewed the labs performed to date as well as medications administered while in observation.  Recent changes in the last 24 hours include none.  Plan  Current plan is for inpatient. Albert Miranda is under involuntary commitment.      [name], MD 10/14/20 [phone]

## 2020-10-14 NOTE — ED Notes (Signed)
Pt discharged under IVC to [facility].  3 bags sent with officer.  VS stable.  Pt cooperative.

## 2020-10-14 NOTE — ED Notes (Signed)
RN attempted to call report. No answer 

## 2020-10-14 NOTE — ED Notes (Signed)
RN called dinning services to get patient his double portions.

## 2020-10-14 NOTE — BH Assessment (Addendum)
Patient has been accepted to [facility].  Patient assigned to Adult Accepting physician is Dr. Estill Cotta.  Call report to [phone].  Representative was [employer].   ER Staff is aware of it:  Christen Bame, ER Secretary  Dr. Katrinka Blazing, ER MD  Amy B., Patient's Nurse  Address: [address]  [place], [place] [address]

## 2020-10-14 NOTE — ED Notes (Signed)
VS not obtained at this time due to pt being asleep.  

## 2020-10-14 NOTE — ED Notes (Signed)
Lunch tray given. 

## 2020-10-14 NOTE — ED Notes (Signed)
Rn attempted to call report at [facility].  Asked to call back in 10 minutes.

## 2020-10-14 NOTE — BH Assessment (Signed)
Referral information refaxed to;    [facility] ([phone]-or- [phone]), no thought disorder beds   [name] ([phone]-or- [phone]),   [facility] ([phone]),   [facility] ([phone] -or- [phone]), 

## 2020-10-14 NOTE — ED Notes (Signed)
Pt's chart has diabetes listed as a diagnosis.  Receiving RN requested CBG be checked before pt was transferred.    CBG 125

## 2021-01-03 ENCOUNTER — Emergency Department
Admission: EM | Admit: 2021-01-03 | Discharge: 2021-01-04 | Disposition: A | Discharge status: Home / Self Care | Payer: Medicare Other | Attending: Emergency Medicine | Admitting: Emergency Medicine

## 2021-01-03 ENCOUNTER — Other Ambulatory Visit: Payer: Self-pay

## 2021-01-03 DIAGNOSIS — Z79899 Other long term (current) drug therapy: Secondary | ICD-10-CM | POA: Insufficient documentation

## 2021-01-03 DIAGNOSIS — R443 Hallucinations, unspecified: Secondary | ICD-10-CM | POA: Diagnosis not present

## 2021-01-03 DIAGNOSIS — F250 Schizoaffective disorder, bipolar type: Secondary | ICD-10-CM

## 2021-01-03 DIAGNOSIS — R45850 Homicidal ideations: Secondary | ICD-10-CM | POA: Diagnosis not present

## 2021-01-03 DIAGNOSIS — F1210 Cannabis abuse, uncomplicated: Secondary | ICD-10-CM | POA: Diagnosis present

## 2021-01-03 DIAGNOSIS — Y900 Blood alcohol level of less than 20 mg/100 ml: Secondary | ICD-10-CM | POA: Insufficient documentation

## 2021-01-03 DIAGNOSIS — Z20822 Contact with and (suspected) exposure to covid-19: Secondary | ICD-10-CM | POA: Diagnosis not present

## 2021-01-03 DIAGNOSIS — F17210 Nicotine dependence, cigarettes, uncomplicated: Secondary | ICD-10-CM | POA: Insufficient documentation

## 2021-01-03 DIAGNOSIS — E119 Type 2 diabetes mellitus without complications: Secondary | ICD-10-CM | POA: Insufficient documentation

## 2021-01-03 DIAGNOSIS — Z91199 Patient's noncompliance with other medical treatment and regimen due to unspecified reason: Secondary | ICD-10-CM

## 2021-01-03 DIAGNOSIS — F259 Schizoaffective disorder, unspecified: Secondary | ICD-10-CM | POA: Diagnosis present

## 2021-01-03 DIAGNOSIS — R21 Rash and other nonspecific skin eruption: Secondary | ICD-10-CM | POA: Diagnosis present

## 2021-01-03 LAB — ACETAMINOPHEN LEVEL: Acetaminophen (Tylenol), Serum: <10 ug/mL — ABNORMAL LOW (ref 10–30)

## 2021-01-03 LAB — CBC
HCT: 44.8 % (ref 39.0–52.0)
Hemoglobin: 16.2 g/dL (ref 13.0–17.0)
MCH: 33.4 pg (ref 26.0–34.0)
MCHC: 36.2 g/dL — ABNORMAL HIGH (ref 30.0–36.0)
MCV: 92.4 fL (ref 80.0–100.0)
Platelets: 236 10*3/uL (ref 150–400)
RBC: 4.85 MIL/uL (ref 4.22–5.81)
RDW: 12.5 % (ref 11.5–15.5)
WBC: 6.1 10*3/uL (ref 4.0–10.5)
nRBC: 0 % (ref 0.0–0.2)

## 2021-01-03 LAB — COMPREHENSIVE METABOLIC PANEL
ALT: 20 U/L (ref 0–44)
AST: 27 U/L (ref 15–41)
Albumin: 4 g/dL (ref 3.5–5.0)
Alkaline Phosphatase: 66 U/L (ref 38–126)
Anion gap: 8 (ref 5–15)
BUN: 12 mg/dL (ref 6–20)
CO2: 29 mmol/L (ref 22–32)
Calcium: 9.2 mg/dL (ref 8.9–10.3)
Chloride: 98 mmol/L (ref 98–111)
Creatinine, Ser: 1.09 mg/dL (ref 0.61–1.24)
GFR, Estimated: >60 mL/min (ref >60)
Glucose, Bld: 141 mg/dL — ABNORMAL HIGH (ref 70–99)
Potassium: 3.9 mmol/L (ref 3.5–5.1)
Sodium: 135 mmol/L (ref 135–145)
Total Bilirubin: 0.8 mg/dL (ref 0.3–1.2)
Total Protein: 7.4 g/dL (ref 6.5–8.1)

## 2021-01-03 LAB — RESP PANEL BY RT-PCR (FLU A&B, COVID) ARPGX2
Influenza A by PCR: NEGATIVE
Influenza B by PCR: NEGATIVE
SARS Coronavirus 2 by RT PCR: NEGATIVE

## 2021-01-03 LAB — SALICYLATE LEVEL: Salicylate Lvl: <7 mg/dL — ABNORMAL LOW (ref 7.0–30.0)

## 2021-01-03 LAB — ETHANOL: Alcohol, Ethyl (B): <10 mg/dL (ref <10)

## 2021-01-03 MED ORDER — ARIPIPRAZOLE ER 400 MG IM SRER
400.0000 mg | INTRAMUSCULAR | Status: DC
  Administered 2021-01-03: 400 mg via INTRAMUSCULAR
  Filled 2021-01-03: qty 2

## 2021-01-03 MED ORDER — LAMOTRIGINE 25 MG PO TABS
25.0000 mg | ORAL_TABLET | Freq: Every day | ORAL | Status: DC
  Administered 2021-01-03 – 2021-01-04 (×2): 25 mg via ORAL
  Filled 2021-01-03 (×2): qty 1

## 2021-01-03 MED ORDER — ARIPIPRAZOLE ER 400 MG IM SRER
400.0000 mg | INTRAMUSCULAR | Status: DC
  Filled 2021-01-03: qty 2

## 2021-01-03 MED ORDER — OLANZAPINE 5 MG PO TABS: 5.0000 mg | ORAL_TABLET | Freq: Every day | ORAL | Status: DC

## 2021-01-03 MED ORDER — ARIPIPRAZOLE 10 MG PO TABS
10.0000 mg | ORAL_TABLET | Freq: Every day | ORAL | Status: DC
  Administered 2021-01-03 – 2021-01-04 (×2): 10 mg via ORAL
  Filled 2021-01-03 (×2): qty 1

## 2021-01-03 NOTE — ED Notes (Signed)
[place] tray given w/ apple juice.

## 2021-01-03 NOTE — ED Notes (Signed)
Snack was given. 
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
## 2021-01-03 NOTE — ED Provider Notes (Signed)
[facility] Emergency Department Provider Note  ____________________________________________   Event Date/Time   First MD Initiated Contact with Patient 01/03/21 1729     (approximate)  I have reviewed the triage vital signs and the nursing notes.   HISTORY  Chief Complaint Rash and Hallucinations   HPI Albert Miranda is a 40 y.o. male with past medical history of bipolar disorder and schizophrenia as well as diabetes who presents for assessment of 2 concerns.  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  He states he has a list of companies that he wants to hurt but no individual people.  He is denying any SI but states he does hear lots of voices telling him to attack people.  He also states that he is worried about redness on his hands face and chest gets worse in the sun has been ongoing over the last several months.  He stated that couple months ago his skin seem to turn neon has not happened since.  He denies any itching or pain.  No fevers, chills, cough, nausea, vomiting, diarrhea, burning with urination, rash or other acute sick symptoms.  He denies any illicit drug use but states he has not been taking his Abilify or other psychiatric medications.  He denies any other acute concerns at this time.         Past Medical History:  Diagnosis Date   Bipolar 1 disorder (HCC)    Diabetes mellitus without complication (HCC)    A1C 10.1 01/2017    Herpes    Hyperglycemia    Patient denies medical problems     Patient Active Problem List   Diagnosis Date Noted   Marijuana use    Acute pulmonary embolism (HCC) 01/19/2019   Herpes 02/20/2017   Penile lesion 02/09/2017   Type 2 diabetes mellitus with hyperglycemia, without long-term current use of insulin (HCC) 02/09/2017   Hyperglycemia 10/23/2016    Schizoaffective disorder (HCC) 05/01/2016   Cannabis abuse 05/01/2016   Noncompliance 05/01/2016    Past Surgical History:  Procedure Laterality Date   INCISION AND DRAINAGE ABSCESS     buttock gluteal region    LEG SURGERY     PULMONARY THROMBECTOMY N/A 01/20/2019   Procedure: PULMONARY THROMBECTOMY, LEFT LOWER EXTREMITY VENOUS LYSIS, IVF FILTER INSERTION;  Surgeon: Annice Needy, MD;  Location: [facility] INVASIVE CV LAB;  Service: Cardiovascular;  Laterality: N/A;    Prior to Admission medications   Medication Sig Start Date End Date Taking? Authorizing Provider  ARIPiprazole (ABILIFY) 10 MG tablet Take 1 tablet (10 mg total) by mouth daily. 10/10/20   Clapacs, Jackquline Denmark, MD  ARIPiprazole ER (ABILIFY MAINTENA) 400 MG SRER injection Inject 2 mLs (400 mg total) into the muscle every 28 (twenty-eight) days. 10/31/20   Clapacs, Jackquline Denmark, MD  lamoTRIgine (LAMICTAL) 25 MG tablet Take 25 mg by mouth daily. 11/28/20   [provider]  OLANZapine (ZYPREXA) 5 MG tablet Take 1 tablet (5 mg total) by mouth at bedtime. 07/17/20   Reggie Pile, MD  Sunscreens [facility] DAILY CARE LIP Luciano Cutter) OINT Please use as needed. 07/22/20   Minna Antis, MD    Allergies Patient has no known allergies.  Family History  Problem Relation Age of Onset   Diabetes Father        dx age 72  Depression Mother     Social History Social History   Tobacco Use   Smoking status: Every Day    Packs/day: 1.00    Types: Cigarettes   Smokeless tobacco: Never   Tobacco comments:    1-1.5 ppd since since age 26 as of 02/07/17 does not want to quit  Vaping Use   Vaping Use: Never used  Substance Use Topics   Alcohol use: Yes    Comment: occassional   Drug use: Yes    Types: Marijuana    Comment: 1-2 x per month     Review of Systems  Review of Systems  Constitutional:  Negative for chills and fever.  HENT:  Negative for sore throat.   Eyes:  Negative for pain.  Respiratory:  Negative for cough and stridor.    Cardiovascular:  Negative for chest pain.  Gastrointestinal:  Negative for vomiting.  Genitourinary:  Negative for dysuria.  Musculoskeletal:  Negative for myalgias.  Skin:  Positive for rash.  Neurological:  Negative for seizures, loss of consciousness and headaches.  Psychiatric/Behavioral:  Positive for hallucinations. Negative for suicidal ideas.   All other systems reviewed and are negative.    ____________________________________________   PHYSICAL EXAM:  VITAL SIGNS: ED Triage Vitals  Enc Vitals Group     BP 01/03/21 1711 139/82     Pulse Rate 01/03/21 1711 86     Resp 01/03/21 1711 17     Temp 01/03/21 1711 98.4 F (36.9 C)     Temp Source 01/03/21 1711 Oral     SpO2 01/03/21 1711 98 %     Weight 01/03/21 1721 210 lb (95.3 kg)     Height 01/03/21 1721 5\' 11"  (1.803 m)     Head Circumference --      Peak Flow --      Pain Score 01/03/21 1721 0     Pain Loc --      Pain Edu? --      Excl. in GC? --    Vitals:   01/03/21 1711  BP: 139/82  Pulse: 86  Resp: 17  Temp: 98.4 F (36.9 C)  SpO2: 98%   Physical Exam Vitals and nursing note reviewed.  Constitutional:      Appearance: He is well-developed.  HENT:     Head: Normocephalic and atraumatic.     Right Ear: External ear normal.     Left Ear: External ear normal.     Nose: Nose normal.     Mouth/Throat:     Mouth: Mucous membranes are moist.  Eyes:     Conjunctiva/sclera: Conjunctivae normal.  Cardiovascular:     Rate and Rhythm: Normal rate and regular rhythm.     Heart sounds: No murmur heard. Pulmonary:     Effort: Pulmonary effort is normal. No respiratory distress.     Breath sounds: Normal breath sounds.  Abdominal:     Palpations: Abdomen is soft.     Tenderness: There is no abdominal tenderness.  Musculoskeletal:     Cervical back: Neck supple.  Skin:    General: Skin is warm and dry.     Capillary Refill: Capillary refill takes less than 2 seconds.  Neurological:     Mental Status:  He is alert and oriented to person, place, and time.  Psychiatric:        Attention and Perception: He perceives auditory hallucinations.        Mood and Affect: Mood normal.  Thought Content: Thought content includes homicidal ideation.    Some very subtle erythema over the patient's knuckles bilaterally but no raised plaques, lesions, blistering, drainage induration tenderness streaking or other overlying skin changes over the face chest arms or hands. ____________________________________________   LABS (all labs ordered are listed, but only abnormal results are displayed)  Labs Reviewed  COMPREHENSIVE METABOLIC PANEL - Abnormal; Notable for the following components:      Result Value   Glucose, Bld 141 (*)    All other components within normal limits  SALICYLATE LEVEL - Abnormal; Notable for the following components:   Salicylate Lvl <7.0 (*)    All other components within normal limits  ACETAMINOPHEN LEVEL - Abnormal; Notable for the following components:   Acetaminophen (Tylenol), Serum <10 (*)    All other components within normal limits  CBC - Abnormal; Notable for the following components:   MCHC 36.2 (*)    All other components within normal limits  RESP PANEL BY RT-PCR (FLU A&B, COVID) ARPGX2  ETHANOL  URINE DRUG SCREEN, QUALITATIVE ([facility] ONLY)  RPR   ____________________________________________  EKG  ____________________________________________  RADIOLOGY  ED MD interpretation:   Official radiology report(s): No results found.  ____________________________________________   PROCEDURES  Procedure(s) performed (including Critical Care):  Procedures   ____________________________________________   INITIAL IMPRESSION / ASSESSMENT AND PLAN / ED COURSE     Patient presents with above-stated history exam with concerns for rash she has noticed over the last couple months fairly diffusely it seems worse in the sun.  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  He is denying any SI, name of sick person he wishes to harm but she states that he wants to hurt other companies.  He has no specific plans for this.  He is afebrile hemodynamically stable arrival.  He does have a little bit of erythema on his ankles bilaterally but no other evidence of overlying skin changes.  Given this been ongoing for several weeks to several weeks if not months I have a low suspicion for cellulitis, SJS or contact dermatitis given absence of any associated itching.  It is possible this is related to contact dermatitis versus very mild sunburn as patient does seem to associate with being in the sun.  We will have him follow-up with dermatology.  With regard to his hallucinations, not taking his medications and voicing thoughts about wanting to harm companies will consult psychiatry to see if this may represent an acute decompensation from his baseline schizophrenia.  The patient has been placed in psychiatric observation due to the need to provide a safe environment for the patient while obtaining psychiatric consultation and evaluation, as well as ongoing medical and medication management to treat the patient's condition.  The patient has not been placed under full IVC at this time.        ____________________________________________   FINAL CLINICAL IMPRESSION(S) / ED DIAGNOSES  Final diagnoses:  Rash  Hallucinations    Medications  ARIPiprazole (ABILIFY) tablet 10 mg (10 mg Oral Given 01/03/21 1914)  lamoTRIgine (LAMICTAL) tablet 25 mg (has no administration in time range)  ARIPiprazole ER (ABILIFY MAINTENA) injection 400 mg (has no administration in time range)     ED Discharge Orders     None        Note:  This document was prepared using Dragon voice recognition software and may include unintentional  dictation errors.    Gilles Chiquito, MD 01/03/21  2003

## 2021-01-03 NOTE — ED Notes (Signed)
Pt. To BHU from ED ambulatory without difficulty, to room  BHU4. Report from  [facility]. Pt. Is alert and oriented, warm and dry in no distress. Pt. Denies SI, HI, and AVH. Pt. Calm and cooperative. Pt. Made aware of security cameras and Q15 minute rounds. Pt. Encouraged to let Nursing staff know of any concerns or needs.

## 2021-01-03 NOTE — ED Notes (Signed)

## 2021-01-03 NOTE — ED Notes (Signed)
Pt dressed out with this RN and Zach NT. Belongings include: Black sandals Black socks Red lighter Green t shirt Gray sweat pants Engineer, mining Blue boxers Headphones Gray head wrap

## 2021-01-04 DIAGNOSIS — R21 Rash and other nonspecific skin eruption: Secondary | ICD-10-CM | POA: Diagnosis not present

## 2021-01-04 DIAGNOSIS — F250 Schizoaffective disorder, bipolar type: Secondary | ICD-10-CM | POA: Diagnosis not present

## 2021-01-04 LAB — URINE DRUG SCREEN, QUALITATIVE (ARMC ONLY)
Amphetamines, Ur Screen: NOT DETECTED
Barbiturates, Ur Screen: NOT DETECTED
Benzodiazepine, Ur Scrn: NOT DETECTED
Cannabinoid 50 Ng, Ur ~~LOC~~: NOT DETECTED
Cocaine Metabolite,Ur ~~LOC~~: NOT DETECTED
MDMA (Ecstasy)Ur Screen: NOT DETECTED
Methadone Scn, Ur: NOT DETECTED
Opiate, Ur Screen: NOT DETECTED
Phencyclidine (PCP) Ur S: NOT DETECTED
Tricyclic, Ur Screen: NOT DETECTED

## 2021-01-04 LAB — RPR: RPR Ser Ql: NONREACTIVE

## 2021-01-04 MED ORDER — ARIPIPRAZOLE 10 MG PO TABS: 10.0000 mg | ORAL_TABLET | Freq: Every day | ORAL | 1 refills | Status: DC

## 2021-01-04 MED ORDER — LAMOTRIGINE 25 MG PO TABS: 25.0000 mg | ORAL_TABLET | Freq: Every day | ORAL | 1 refills | Status: DC

## 2021-01-04 MED ORDER — ARIPIPRAZOLE ER 400 MG IM SRER: 400.0000 mg | INTRAMUSCULAR | 1 refills | Status: DC

## 2021-01-04 NOTE — ED Notes (Signed)
Given all of belongings. Verified correct patient and correct discharge papers given. Pt alert and oriented X 4, stable for discharge. RR even and unlabored, color WNL. Discussed discharge instructions and follow-up as directed. Discharge medications discussed, when prescribed. Pt had opportunity to ask questions, and RN available to provide patient and/or family education.

## 2021-01-04 NOTE — BH Assessment (Signed)
Comprehensive Clinical Assessment (CCA) Note  01/04/2021 Albert Miranda [id number]  Chief Complaint: Patient is a 40 year old male presenting to [facility] ED voluntarily due to delusions and HI. Per triage note Pt states he is here because his skin color changed to neon in January. Skin color appears WDL. Reports HI to other companies. 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 Per Dr. Toni Amend patient reported that he has been outdoors more that usual and noticed his skin had turned dark and that he is swollen up all over. Patient does report continued marijuana use and denies it being an issue and denies SI.  Per Psyc MD Dr. Toni Amend patient to be reassessed tomorrow 01/04/21 Chief Complaint  Patient presents with   Rash   Hallucinations   Visit Diagnosis: Schizoaffective Disorder, Marijuana use   CCA Screening, Triage and Referral (STR)  Patient Reported Information How did you hear about [place]? Self  Referral name: Self  Referral phone number: No data recorded  Whom do you see for routine medical problems? I don't have a doctor  Practice/Facility Name: No data recorded Practice/Facility Phone Number: No data recorded Name of Contact: No data recorded Contact Number: No data recorded Contact Fax Number: No data recorded Prescriber Name: No data recorded Prescriber Address (if known): No data recorded  What Is the Reason for Your Visit/Call Today? Patient presents voluntarily due to delusions and HI  How Long Has This Been Causing You Problems? > than 6 months  What Do You Feel Would Help You the Most Today? Treatment for Depression or other mood problem   Have You Recently Been in Any Inpatient Treatment (Hospital/Detox/Crisis Center/28-Day Program)? No  Name/Location of Program/Hospital:No data recorded How Long Were You There? No data recorded When Were You Discharged? No data recorded  Have You Ever Received  Services From [facility] Before? No  Who Do You See at [facility]? No data recorded  Have You Recently Had Any Thoughts About Hurting Yourself? No  Are You Planning to Commit Suicide/Harm Yourself At This time? No   Have you Recently Had Thoughts About Hurting Someone [name]? Yes  Explanation: No data recorded  Have You Used Any Alcohol or Drugs in the Past 24 Hours? No  How Long Ago Did You Use Drugs or Alcohol? No data recorded What Did You Use and How Much? Pt denies substance use   Do You Currently Have a Therapist/Psychiatrist? No  Name of Therapist/Psychiatrist: No data recorded  Have You Been Recently Discharged From Any Office Practice or Programs? No  Explanation of Discharge From Practice/Program: No data recorded    CCA Screening Triage Referral Assessment Type of Contact: Face-to-Face  Is this Initial or Reassessment? No data recorded Date Telepsych consult ordered in [facility]:  No data recorded Time Telepsych consult ordered in [facility]:  No data recorded  Patient Reported Information Reviewed? Yes  Patient Left Without Being Seen? No data recorded Reason for Not Completing Assessment: No data recorded  Collateral Involvement: None   Does Patient Have a Court Appointed Legal Guardian? No data recorded Name and Contact of Legal Guardian: No data recorded If Minor and Not Living with Parent(s), Who has Custody? No data recorded Is CPS involved or ever been involved? Never  Is APS involved or ever been involved? Never   Patient Determined To Be At Risk for Harm To Self or Others Based on Review of Patient Reported Information or Presenting  Complaint? No  Method: No data recorded Availability of Means: No data recorded Intent: No data recorded Notification Required: No data recorded Additional Information for Danger to Others Potential: No data recorded Additional Comments for Danger to Others Potential: No data recorded Are There Guns or Other Weapons in  Your Home? No data recorded Types of Guns/Weapons: No data recorded Are These Weapons Safely Secured?                            No data recorded Who Could Verify You Are Able To Have These Secured: No data recorded Do You Have any Outstanding Charges, Pending Court Dates, Parole/Probation? No data recorded Contacted To Inform of Risk of Harm To Self or Others: No data recorded  Location of Assessment: [facility]   Does Patient Present under Involuntary Commitment? No  IVC Papers Initial File Date: 10/11/20   [place] of Residence: [place]   Patient Currently Receiving the Following Services: Not Receiving Services   Determination of Need: Emergent (2 hours)   Options For Referral: Inpatient Hospitalization     CCA Biopsychosocial Intake/Chief Complaint:  Psychiatric Evaluation  Current Symptoms/Problems: Medication noncompliance   Patient Reported Schizophrenia/Schizoaffective Diagnosis in Past: No   Strengths: Patient is able to communicate  Preferences: None noted  Abilities: Asks for help   Type of Services Patient Feels are Needed: Medication management   Initial Clinical Notes/Concerns: No data recorded  Mental Health Symptoms Depression:   None   Duration of Depressive symptoms: No data recorded  Mania:   None   Anxiety:    None   Psychosis:   Hallucinations; Delusions   Duration of Psychotic symptoms:  Greater than six months   Trauma:   None   Obsessions:   None   Compulsions:   None   Inattention:   None   Hyperactivity/Impulsivity:   None   Oppositional/Defiant Behaviors:   None   Emotional Irregularity:   None   Other Mood/Personality Symptoms:  No data recorded   Mental Status Exam Appearance and self-care  Stature:   Average   Weight:   Average weight   Clothing:   Casual   Grooming:   Normal   Cosmetic use:   None   Posture/gait:   Normal   Motor activity:   Not Remarkable   Sensorium   Attention:   Distractible   Concentration:   Variable   Orientation:   X5   Recall/memory:   Normal   Affect and Mood  Affect:   Appropriate   Mood:   Irritable   Relating  Eye contact:   None   Facial expression:   Responsive   Attitude toward examiner:   Cooperative   Thought and Language  Speech flow:  Clear and Coherent   Thought content:   Delusions   Preoccupation:   Homicidal   Hallucinations:   Auditory   Organization:  No data recorded  [employer] of Knowledge:   Fair   Intelligence:   Average   Abstraction:   Normal   Judgement:   Impaired   Reality Testing:   Distorted   Insight:   Lacking   Decision Making:   Impulsive   Social Functioning  Social Maturity:   Irresponsible; Impulsive   Social Judgement:   Heedless   Stress  Stressors:   Housing; Office manager Ability:   Normal   Skill Deficits:   Self-care  Supports:   Support needed     Religion: Religion/Spirituality Are You A Religious Person?: No  Leisure/Recreation: Leisure / Recreation Do You Have Hobbies?: No  Exercise/Diet: Exercise/Diet Do You Exercise?: No Have You Gained or Lost A Significant Amount of Weight in the Past Six Months?: No Do You Follow a Special Diet?: No Do You Have Any Trouble Sleeping?: No   CCA Employment/Education Employment/Work Situation: Employment / Work Systems developer: On disability Patient's Job has Been Impacted by Current Illness: No Has Patient ever Been in the [employer]?: No  Education: Education Last Grade Completed:  ([employer]) Did You Attend College?: No Did You Have An Individualized Education Program (IIEP): No Did You Have Any Difficulty At School?: No   CCA Family/Childhood History Family and Relationship History: Family history Marital status: Single Does patient have children?: No  Childhood History:  Childhood History By whom was/is the patient  raised?: Mother Did patient suffer any verbal/emotional/physical/sexual abuse as a child?: No Has patient ever been sexually abused/assaulted/raped as an adolescent or adult?: No Witnessed domestic violence?: No Has patient been affected by domestic violence as an adult?: No  Child/Adolescent Assessment:     CCA Substance Use Alcohol/Drug Use: Alcohol / Drug Use Pain Medications: See MAR Prescriptions: See MAR Over the Counter: See MAR History of alcohol / drug use?: Yes Longest period of sobriety (when/how long): Unable to Assess Negative Consequences of Use: Personal relationships Withdrawal Symptoms: None                         ASAM's:  Six Dimensions of Multidimensional Assessment  Dimension 1:  Acute Intoxication and/or Withdrawal Potential:      Dimension 2:  Biomedical Conditions and Complications:      Dimension 3:  Emotional, Behavioral, or Cognitive Conditions and Complications:  Dimension 3:  Description of emotional, behavioral, or cognitive conditions and complications: Pt has a schizoaffective diagnosis  Dimension 4:  Readiness to Change:     Dimension 5:  Relapse, Continued use, or Continued Problem Potential:     Dimension 6:  Recovery/Living Environment:     ASAM Severity Score:    ASAM Recommended Level of Treatment:     Substance use Disorder (SUD) Substance Use Disorder (SUD)  Checklist Symptoms of Substance Use: Continued use despite having a persistent/recurrent physical/psychological problem caused/exacerbated by use  Recommendations for Services/Supports/Treatments:  Reassess  DSM5 Diagnoses: Patient Active Problem List   Diagnosis Date Noted   Marijuana use    Acute pulmonary embolism (HCC) 01/19/2019   Herpes 02/20/2017   Penile lesion 02/09/2017   Type 2 diabetes mellitus with hyperglycemia, without long-term current use of insulin (HCC) 02/09/2017   Hyperglycemia 10/23/2016   Schizoaffective disorder (HCC) 05/01/2016    Cannabis abuse 05/01/2016   Noncompliance 05/01/2016    Patient Centered Plan: Patient is on the following Treatment Plan(s):  Impulse Control   Referrals to Alternative Service(s): Referred to Alternative Service(s):   Place:   Date:   Time:    Referred to Alternative Service(s):   Place:   Date:   Time:    Referred to Alternative Service(s):   Place:   Date:   Time:    Referred to Alternative Service(s):   Place:   Date:   Time:     Caylor Tallarico A Trachelle Low, LCAS-A

## 2021-01-04 NOTE — ED Notes (Signed)
Hospital meal provided.  100% consumed, pt tolerated w/o complaints.  Waste discarded appropriately.   

## 2021-01-04 NOTE — ED Provider Notes (Signed)
Emergency Medicine Observation Re-evaluation Note  Albert Miranda is a 40 y.o. male, seen on rounds today.  Pt initially presented to the ED for complaints of Rash and Hallucinations Currently, the patient is standing in common area, denies complaints.  Physical Exam  BP 138/81 (BP Location: Left Arm)   Pulse 88   Temp 98.2 F (36.8 C) (Oral)   Resp 18   Ht 5\' 11"  (1.803 m)   Wt 95.3 kg   SpO2 100%   BMI 29.29 kg/m  Physical Exam Constitutional: Resting comfortably. Eyes: Conjunctivae are normal. Head: Atraumatic. Nose: No congestion/rhinnorhea. Mouth/Throat: Mucous membranes are moist. Neck: Normal ROM Cardiovascular: No cyanosis noted. Respiratory: Normal respiratory effort. Gastrointestinal: Non-distended. Genitourinary: deferred Musculoskeletal: No lower extremity tenderness nor edema. Neurologic:  Normal speech and language. No gross focal neurologic deficits are appreciated. Skin:  Skin is warm, dry and intact. No rash noted.   ED Course / MDM  EKG:   I have reviewed the labs performed to date as well as medications administered while in observation.  Recent changes in the last 24 hours include patient cleared for discharge home by psychiatry.  Plan  Current plan is for discharge home with resources.  is not under involuntary commitment.     Albert Quaker, MD 01/04/21 [id number]
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
## 2021-01-06 ENCOUNTER — Encounter: Payer: Self-pay | Admitting: Emergency Medicine

## 2021-01-06 ENCOUNTER — Emergency Department: Payer: Medicare Other

## 2021-01-06 ENCOUNTER — Other Ambulatory Visit: Payer: Self-pay

## 2021-01-06 ENCOUNTER — Emergency Department
Admission: EM | Admit: 2021-01-06 | Discharge: 2021-01-06 | Disposition: A | Discharge status: Home / Self Care | Payer: Medicare Other | Attending: Emergency Medicine | Admitting: Emergency Medicine

## 2021-01-06 DIAGNOSIS — Z5321 Procedure and treatment not carried out due to patient leaving prior to being seen by health care provider: Secondary | ICD-10-CM | POA: Diagnosis not present

## 2021-01-06 DIAGNOSIS — R079 Chest pain, unspecified: Secondary | ICD-10-CM | POA: Diagnosis not present

## 2021-01-06 LAB — CBC
HCT: 49 % (ref 39.0–52.0)
Hemoglobin: 16.9 g/dL (ref 13.0–17.0)
MCH: 32.2 pg (ref 26.0–34.0)
MCHC: 34.5 g/dL (ref 30.0–36.0)
MCV: 93.3 fL (ref 80.0–100.0)
Platelets: 243 10*3/uL (ref 150–400)
RBC: 5.25 MIL/uL (ref 4.22–5.81)
RDW: 12.3 % (ref 11.5–15.5)
WBC: 7.4 10*3/uL (ref 4.0–10.5)
nRBC: 0 % (ref 0.0–0.2)

## 2021-01-06 LAB — BASIC METABOLIC PANEL
Anion gap: 7 (ref 5–15)
BUN: 18 mg/dL (ref 6–20)
CO2: 29 mmol/L (ref 22–32)
Calcium: 9 mg/dL (ref 8.9–10.3)
Chloride: 102 mmol/L (ref 98–111)
Creatinine, Ser: 1.12 mg/dL (ref 0.61–1.24)
GFR, Estimated: >60 mL/min (ref >60)
Glucose, Bld: 132 mg/dL — ABNORMAL HIGH (ref 70–99)
Potassium: 3.9 mmol/L (ref 3.5–5.1)
Sodium: 138 mmol/L (ref 135–145)

## 2021-01-06 LAB — TROPONIN I (HIGH SENSITIVITY): Troponin I (High Sensitivity): 8 ng/L (ref <18)

## 2021-01-06 NOTE — ED Triage Notes (Signed)
Pt called from [facility] to treatment room, no response 

## 2021-01-06 NOTE — ED Triage Notes (Signed)
Pt called from [facility] to treatment room, no response ?

## 2021-01-06 NOTE — ED Triage Notes (Signed)
Pt here for mid chest pain that woke him out of his sleep.  No cardiac hx per pt. Pt denies being diabetic.  Pain started one hr ago, nothing makes it worse.  Has had similar pain in past but has not had it looked at. VSS. Ambulatory, NAD. Unlabored. Admits to weed, but denies cocaine or other drug use.

## 2021-01-06 NOTE — ED Notes (Signed)
Pt called x's 3, no response ?

## 2021-01-26 ENCOUNTER — Emergency Department: Payer: Medicare Other

## 2021-01-26 ENCOUNTER — Emergency Department
Admission: EM | Admit: 2021-01-26 | Discharge: 2021-01-26 | Disposition: A | Discharge status: Home / Self Care | Payer: Medicare Other | Attending: Emergency Medicine | Admitting: Emergency Medicine

## 2021-01-26 ENCOUNTER — Encounter: Payer: Self-pay | Admitting: Emergency Medicine

## 2021-01-26 DIAGNOSIS — R0789 Other chest pain: Secondary | ICD-10-CM | POA: Insufficient documentation

## 2021-01-26 DIAGNOSIS — Z5321 Procedure and treatment not carried out due to patient leaving prior to being seen by health care provider: Secondary | ICD-10-CM | POA: Diagnosis not present

## 2021-01-26 LAB — CBC
HCT: 48.3 % (ref 39.0–52.0)
Hemoglobin: 16.5 g/dL (ref 13.0–17.0)
MCH: 31.2 pg (ref 26.0–34.0)
MCHC: 34.2 g/dL (ref 30.0–36.0)
MCV: 91.3 fL (ref 80.0–100.0)
Platelets: 255 10*3/uL (ref 150–400)
RBC: 5.29 MIL/uL (ref 4.22–5.81)
RDW: 12.4 % (ref 11.5–15.5)
WBC: 7.9 10*3/uL (ref 4.0–10.5)
nRBC: 0 % (ref 0.0–0.2)

## 2021-01-26 LAB — BASIC METABOLIC PANEL
Anion gap: 7 (ref 5–15)
BUN: 24 mg/dL — ABNORMAL HIGH (ref 6–20)
CO2: 26 mmol/L (ref 22–32)
Calcium: 9.2 mg/dL (ref 8.9–10.3)
Chloride: 104 mmol/L (ref 98–111)
Creatinine, Ser: 1.21 mg/dL (ref 0.61–1.24)
GFR, Estimated: >60 mL/min (ref >60)
Glucose, Bld: 136 mg/dL — ABNORMAL HIGH (ref 70–99)
Potassium: 3.9 mmol/L (ref 3.5–5.1)
Sodium: 137 mmol/L (ref 135–145)

## 2021-01-26 LAB — TROPONIN I (HIGH SENSITIVITY): Troponin I (High Sensitivity): 6 ng/L (ref <18)

## 2021-01-26 LAB — PROCALCITONIN: Procalcitonin: <0.1 ng/mL

## 2021-01-26 NOTE — ED Triage Notes (Signed)
Pt c/o left sided, non-radiating chest pain x1 hour. Pt denies SOB.

## 2021-02-23 ENCOUNTER — Emergency Department
Admission: EM | Admit: 2021-02-23 | Discharge: 2021-02-24 | Disposition: A | Discharge status: Home / Self Care | Payer: Medicare Other | Attending: Emergency Medicine | Admitting: Emergency Medicine

## 2021-02-23 DIAGNOSIS — F259 Schizoaffective disorder, unspecified: Secondary | ICD-10-CM | POA: Diagnosis present

## 2021-02-23 DIAGNOSIS — Z79899 Other long term (current) drug therapy: Secondary | ICD-10-CM | POA: Diagnosis not present

## 2021-02-23 DIAGNOSIS — F17210 Nicotine dependence, cigarettes, uncomplicated: Secondary | ICD-10-CM | POA: Diagnosis not present

## 2021-02-23 DIAGNOSIS — E119 Type 2 diabetes mellitus without complications: Secondary | ICD-10-CM | POA: Diagnosis not present

## 2021-02-23 DIAGNOSIS — R440 Auditory hallucinations: Secondary | ICD-10-CM

## 2021-02-23 LAB — CBC
HCT: 48.2 % (ref 39.0–52.0)
Hemoglobin: 16.5 g/dL (ref 13.0–17.0)
MCH: 31.2 pg (ref 26.0–34.0)
MCHC: 34.2 g/dL (ref 30.0–36.0)
MCV: 91.1 fL (ref 80.0–100.0)
Platelets: 272 10*3/uL (ref 150–400)
RBC: 5.29 MIL/uL (ref 4.22–5.81)
RDW: 12.1 % (ref 11.5–15.5)
WBC: 8.5 10*3/uL (ref 4.0–10.5)
nRBC: 0 % (ref 0.0–0.2)

## 2021-02-23 LAB — COMPREHENSIVE METABOLIC PANEL
ALT: 18 U/L (ref 0–44)
AST: 23 U/L (ref 15–41)
Albumin: 4.2 g/dL (ref 3.5–5.0)
Alkaline Phosphatase: 80 U/L (ref 38–126)
Anion gap: 8 (ref 5–15)
BUN: 20 mg/dL (ref 6–20)
CO2: 28 mmol/L (ref 22–32)
Calcium: 9 mg/dL (ref 8.9–10.3)
Chloride: 99 mmol/L (ref 98–111)
Creatinine, Ser: 1.05 mg/dL (ref 0.61–1.24)
GFR, Estimated: >60 mL/min (ref >60)
Glucose, Bld: 152 mg/dL — ABNORMAL HIGH (ref 70–99)
Potassium: 3.8 mmol/L (ref 3.5–5.1)
Sodium: 135 mmol/L (ref 135–145)
Total Bilirubin: 0.8 mg/dL (ref 0.3–1.2)
Total Protein: 8 g/dL (ref 6.5–8.1)

## 2021-02-23 LAB — URINE DRUG SCREEN, QUALITATIVE (ARMC ONLY)
Amphetamines, Ur Screen: NOT DETECTED
Barbiturates, Ur Screen: NOT DETECTED
Benzodiazepine, Ur Scrn: NOT DETECTED
Cannabinoid 50 Ng, Ur ~~LOC~~: NOT DETECTED
Cocaine Metabolite,Ur ~~LOC~~: NOT DETECTED
MDMA (Ecstasy)Ur Screen: NOT DETECTED
Methadone Scn, Ur: NOT DETECTED
Opiate, Ur Screen: NOT DETECTED
Phencyclidine (PCP) Ur S: NOT DETECTED
Tricyclic, Ur Screen: NOT DETECTED

## 2021-02-23 LAB — ETHANOL: Alcohol, Ethyl (B): <10 mg/dL (ref <10)

## 2021-02-23 NOTE — ED Triage Notes (Signed)
Pt was taking his prescribed medications but then ran out. Pt is now having auditory hallucinations. Denies any SI. Pt just would like to be seen to get medications refilled.

## 2021-02-23 NOTE — ED Notes (Signed)
Patient states he has been homeless for the past 6-8 months and has not had his medications for that length of time.  Patient endorsed that he is having visual and auditory hallucinations but denies si or hi at this time.  Patient is calm and cooperative.

## 2021-02-23 NOTE — ED Notes (Signed)
This tech and tech [facility] dressed out pt.   Pt belongings include: Blue jean jacket Wallace Cullens sweater [employer] and gray socks Wallace Cullens sweat pants Black pull over  [employer]

## 2021-02-23 NOTE — ED Provider Notes (Signed)
[facility] Emergency Department Provider Note ____________________________________________   Event Date/Time   First MD Initiated Contact with Patient 02/23/21 2339     (approximate)  I have reviewed the triage vital signs and the nursing notes.   HISTORY  Chief Complaint Mental Health Problem    HPI Albert Miranda is a 40 y.o. male with PMH as noted below including bipolar disorder and diabetes who presents requesting psychiatric evaluation.  The patient states that he has had increased auditory hallucinations recently, telling him all kinds of different things.  He states that he has been off of his medications because he became homeless.  He denies SI or HI.  He reports chronic leg and back pain but no acute change, and denies any other acute medical complaints.  Past Medical History:  Diagnosis Date   Bipolar 1 disorder (HCC)    Diabetes mellitus without complication (HCC)    A1C 10.1 01/2017    Herpes    Hyperglycemia    Patient denies medical problems     Patient Active Problem List   Diagnosis Date Noted   Marijuana use    Acute pulmonary embolism (HCC) 01/19/2019   Herpes 02/20/2017   Penile lesion 02/09/2017   Type 2 diabetes mellitus with hyperglycemia, without long-term current use of insulin (HCC) 02/09/2017   Hyperglycemia 10/23/2016   Schizoaffective disorder (HCC) 05/01/2016   Cannabis abuse 05/01/2016   Noncompliance 05/01/2016    Past Surgical History:  Procedure Laterality Date   INCISION AND DRAINAGE ABSCESS     buttock gluteal region    LEG SURGERY     PULMONARY THROMBECTOMY N/A 01/20/2019   Procedure: PULMONARY THROMBECTOMY, LEFT LOWER EXTREMITY VENOUS LYSIS, IVF FILTER INSERTION;  Surgeon: Annice Needy, MD;  Location: [facility] INVASIVE CV LAB;  Service: Cardiovascular;  Laterality: N/A;    Prior to Admission medications   Medication Sig Start Date End Date Taking? Authorizing Provider  ARIPiprazole (ABILIFY) 10 MG  tablet Take 1 tablet (10 mg total) by mouth daily. 01/04/21   Clapacs, Jackquline Denmark, MD  ARIPiprazole ER (ABILIFY MAINTENA) 400 MG SRER injection Inject 2 mLs (400 mg total) into the muscle every 28 (twenty-eight) days. 01/31/21   Clapacs, Jackquline Denmark, MD  lamoTRIgine (LAMICTAL) 25 MG tablet Take 1 tablet (25 mg total) by mouth daily. 01/04/21   Clapacs, Jackquline Denmark, MD    Allergies Patient has no known allergies.  Family History  Problem Relation Age of Onset   Diabetes Father        dx age 70    Depression Mother     Social History Social History   Tobacco Use   Smoking status: Every Day    Packs/day: 1.00    Types: Cigarettes   Smokeless tobacco: Never   Tobacco comments:    1-1.5 ppd since since age 50 as of 02/07/17 does not want to quit  Vaping Use   Vaping Use: Never used  Substance Use Topics   Alcohol use: Yes    Comment: occassional   Drug use: Yes    Types: Marijuana    Comment: 1-2 x per month     Review of Systems  Constitutional: No fever/chills Eyes: No visual changes. ENT: No sore throat. Cardiovascular: Denies chest pain. Respiratory: Denies shortness of breath. Gastrointestinal: No vomiting or diarrhea.  Genitourinary: Negative for dysuria.  Musculoskeletal: Positive for chronic back pain. Skin: Negative for rash. Neurological: Negative for headache.   ____________________________________________   PHYSICAL EXAM:  VITAL  SIGNS: ED Triage Vitals  Enc Vitals Group     BP 02/23/21 2249 137/87     Pulse Rate 02/23/21 2249 90     Resp 02/23/21 2249 18     Temp 02/23/21 2249 98.4 F (36.9 C)     Temp Source 02/23/21 2249 Oral     SpO2 02/23/21 2249 96 %     Weight 02/23/21 2250 205 lb 0.4 oz (93 kg)     Height --      Head Circumference --      Peak Flow --      Pain Score 02/23/21 2249 0     Pain Loc --      Pain Edu? --      Excl. in GC? --     Constitutional: Alert and oriented. Well appearing and in no acute distress. Eyes: Conjunctivae are  normal.  Head: Atraumatic. Nose: No congestion/rhinnorhea. Mouth/Throat: Mucous membranes are moist.   Neck: Normal range of motion.  Cardiovascular: Good peripheral circulation. Respiratory: Normal respiratory effort.   Gastrointestinal: No distention.  Musculoskeletal: Extremities warm and well perfused.  Neurologic:  Normal speech and language. No gross focal neurologic deficits are appreciated.  Skin:  Skin is warm and dry. No rash noted. Psychiatric: Calm and cooperative.  ____________________________________________   LABS (all labs ordered are listed, but only abnormal results are displayed)  Labs Reviewed  COMPREHENSIVE METABOLIC PANEL - Abnormal; Notable for the following components:      Result Value   Glucose, Bld 152 (*)    All other components within normal limits  ETHANOL  CBC  URINE DRUG SCREEN, QUALITATIVE ([facility] ONLY)   ____________________________________________  EKG   ____________________________________________  RADIOLOGY    ____________________________________________   PROCEDURES  Procedure(s) performed: No  Procedures  Critical Care performed: No ____________________________________________   INITIAL IMPRESSION / ASSESSMENT AND PLAN / ED COURSE  Pertinent labs & imaging results that were available during my care of the patient were reviewed by me and considered in my medical decision making (see chart for details).   40 year old male with PMH as noted above presents for mental health evaluation reporting medication noncompliance and increased auditory hallucinations.  He denies SI or HI.  He has no acute medical complaints.  I reviewed the past medical records in Epic; the patient was most recently evaluated in the ED on 10/13 and seen by psychiatry.  He was given an Abilify injection and restarted on p.o. medications.  The patient received several prescriptions but states that he has not been able to take these medications due to social  issues.  However, he states that he now will be able to fill prescriptions if he gets new ones today.  Overall presentation is consistent with chronic schizoaffective symptoms related to medication noncompliance.  I doubt that the patient will require admission.  I have consulted psychiatry and [name].  Disposition will be based on their recommendations.  ____________________________  The patient has been placed in psychiatric observation due to the need to provide a safe environment for the patient while obtaining psychiatric consultation and evaluation, as well as ongoing medical and medication management to treat the patient's condition.  The patient has not been placed under full IVC at this time.   ----------------------------------------- 7:20 AM on 02/24/2021 -----------------------------------------  I signed the patient out to the oncoming ED physician Dr. Lenard Lance.  Pending psychiatry recommendations for disposition.  ____________________________________________   FINAL CLINICAL IMPRESSION(S) / ED DIAGNOSES  Final diagnoses:  Auditory hallucinations  NEW MEDICATIONS STARTED DURING THIS VISIT:  New Prescriptions   No medications on file     Note:  This document was prepared using Dragon voice recognition software and may include unintentional dictation errors.    Dionne Bucy, MD 02/24/21 [phone]

## 2021-02-24 DIAGNOSIS — F259 Schizoaffective disorder, unspecified: Secondary | ICD-10-CM | POA: Diagnosis not present

## 2021-02-24 MED ORDER — ARIPIPRAZOLE 10 MG PO TABS: 10.0000 mg | ORAL_TABLET | Freq: Every day | ORAL | Status: DC

## 2021-02-24 NOTE — BH Assessment (Signed)
Writer spoke with the patient to complete an updated/reassessment. Patient denies SI/HI and AV/H.  Patient does not meet inpatient criteria.   Made several attempts to speak with patient's mother and uncle. Patient provider with phone number for uncle and mother's number was in his chart. Unable to leave voicemail on uncle's phone. Lift a voicemail message on mother's phone, requesting return phone call. Next times writer called, it wouldn't let him leave a voicemail message.

## 2021-02-24 NOTE — BH Assessment (Signed)
Comprehensive Clinical Assessment (CCA) Note  02/24/2021 SEYED ZIENTEK [id number]  Chief Complaint:  Chief Complaint  Patient presents with   Mental Health Problem   Visit Diagnosis: Schizoaffectve disorder    CCA Screening, Triage and Referral (STR)  Patient Reported Information How did you hear about [place]? Self  Referral name: Self  Referral phone number: No data recorded  Whom do you see for routine medical problems? I don't have a doctor  Practice/Facility Name: No data recorded Practice/Facility Phone Number: No data recorded Name of Contact: No data recorded Contact Number: No data recorded Contact Fax Number: No data recorded Prescriber Name: No data recorded Prescriber Address (if known): No data recorded  What Is the Reason for Your Visit/Call Today? Patient presents to ED voluntarily due to med noncompliance and auditory hallucinations.  How Long Has This Been Causing You Problems? > than 6 months  What Do You Feel Would Help You the Most Today? Medication(s)   Have You Recently Been in Any Inpatient Treatment (Hospital/Detox/Crisis Center/28-Day Program)? No  Name/Location of Program/Hospital:No data recorded How Long Were You There? No data recorded When Were You Discharged? No data recorded  Have You Ever Received Services From [facility] Before? No  Who Do You See at [facility]? No data recorded  Have You Recently Had Any Thoughts About Hurting Yourself? No  Are You Planning to Commit Suicide/Harm Yourself At This time? No   Have you Recently Had Thoughts About [place]? No  Explanation: No data recorded  Have You Used Any Alcohol or Drugs in the Past 24 Hours? No  How Long Ago Did You Use Drugs or Alcohol? No data recorded What Did You Use and How Much? Pt denies substance use   Do You Currently Have a Therapist/Psychiatrist? No  Name of Therapist/Psychiatrist: No data recorded  Have You Been Recently Discharged From Any  Office Practice or Programs? No  Explanation of Discharge From Practice/Program: No data recorded    CCA Screening Triage Referral Assessment Type of Contact: Face-to-Face  Is this Initial or Reassessment? No data recorded Date Telepsych consult ordered in [facility]:  No data recorded Time Telepsych consult ordered in [facility]:  No data recorded  Patient Reported Information Reviewed? Yes  Patient Left Without Being Seen? No data recorded Reason for Not Completing Assessment: No data recorded  Collateral Involvement: None   Does Patient Have a Court Appointed Legal Guardian? No data recorded Name and Contact of Legal Guardian: No data recorded If Minor and Not Living with Parent(s), Who has Custody? n/a  Is CPS involved or ever been involved? Never  Is APS involved or ever been involved? Never   Patient Determined To Be At Risk for Harm To Self or Others Based on Review of Patient Reported Information or Presenting Complaint? No  Method: No data recorded Availability of Means: No data recorded Intent: No data recorded Notification Required: No data recorded Additional Information for Danger to Others Potential: No data recorded Additional Comments for Danger to Others Potential: No data recorded Are There Guns or Other Weapons in Your Home? No data recorded Types of Guns/Weapons: No data recorded Are These Weapons Safely Secured?                            No data recorded Who Could Verify You Are Able To Have These Secured: No data recorded Do You Have any Outstanding Charges, Pending Court Dates, Parole/Probation? No data  recorded Contacted To Inform of Risk of Harm To Self or Others: No data recorded  Location of Assessment: [facility]   Does Patient Present under Involuntary Commitment? No  IVC Papers Initial File Date: 10/11/20   [place] of Residence: [place]   Patient Currently Receiving the Following Services: Not Receiving Services   Determination of Need: Emergent  (2 hours)   Options For Referral: Therapeutic Triage Services; Other: Comment (Pt is psych cleared and can be discharged with resources.)     CCA Biopsychosocial Intake/Chief Complaint:  Psychiatric Evaluation  Current Symptoms/Problems: Medication noncompliance   Patient Reported Schizophrenia/Schizoaffective Diagnosis in Past: Yes   Strengths: Patient is able to communicate  Preferences: None noted  Abilities: Asks for help   Type of Services Patient Feels are Needed: Medication management   Initial Clinical Notes/Concerns: No data recorded  Mental Health Symptoms Depression:   None   Duration of Depressive symptoms: No data recorded  Mania:   None   Anxiety:    None   Psychosis:   Hallucinations   Duration of Psychotic symptoms:  Greater than six months   Trauma:   N/A   Obsessions:   None   Compulsions:   None   Inattention:   None   Hyperactivity/Impulsivity:   None   Oppositional/Defiant Behaviors:   None   Emotional Irregularity:   None   Other Mood/Personality Symptoms:  No data recorded   Mental Status Exam Appearance and self-care  Stature:   Average   Weight:   Average weight   Clothing:   Age-appropriate   Grooming:   Normal   Cosmetic use:   None   Posture/gait:   Normal   Motor activity:   Not Remarkable   Sensorium  Attention:   Normal   Concentration:   Variable   Orientation:   X5   Recall/memory:   Normal   Affect and Mood  Affect:   Appropriate   Mood:   Dysphoric   Relating  Eye contact:   None   Facial expression:   Responsive   Attitude toward examiner:   Cooperative   Thought and Language  Speech flow:  Clear and Coherent   Thought content:   Appropriate to Mood and Circumstances   Preoccupation:   None   Hallucinations:   Auditory   Organization:  No data recorded  [employer] of Knowledge:   Fair   Intelligence:   Average   Abstraction:    Normal   Judgement:   Impaired   Reality Testing:   Distorted   Insight:   Fair   Decision Making:   Vacilates   Social Functioning  Social Maturity:   Irresponsible; Impulsive   Social Judgement:   Heedless   Stress  Stressors:   Housing; Office manager Ability:   Normal   Skill Deficits:   Self-care   Supports:   Support needed     Religion: Religion/Spirituality Are You A Religious Person?: No How Might This Affect Treatment?: n/a  Leisure/Recreation: Leisure / Recreation Do You Have Hobbies?: No  Exercise/Diet: Exercise/Diet Do You Exercise?: No Have You Gained or Lost A Significant Amount of Weight in the Past Six Months?: No Do You Follow a Special Diet?: No Do You Have Any Trouble Sleeping?: No   CCA Employment/Education Employment/Work Situation: Employment / Work Situation Employment Situation: On disability Why is Patient on Disability: Unknown How Long has Patient Been on Disability: Unknown Patient's Job has Been Impacted by  Current Illness: No Has Patient ever Been in the [place]?: No  Education: Education Is Patient Currently Attending School?: No Last Grade Completed:  (Not assessed) Did You Attend College?: No Did You Have An Individualized Education Program (IIEP): No Did You Have Any Difficulty At School?: No Patient's Education Has Been Impacted by Current Illness: No   CCA Family/Childhood History Family and Relationship History: Family history Marital status: Single Does patient have children?: Yes How many children?: 1 How is patient's relationship with their children?: Good enough  Childhood History:  Childhood History By whom was/is the patient raised?: Mother Did patient suffer any verbal/emotional/physical/sexual abuse as a child?: No Did patient suffer from severe childhood neglect?: No Has patient ever been sexually abused/assaulted/raped as an adolescent or adult?: No Was the patient ever a  victim of a crime or a disaster?: No Witnessed domestic violence?: No Has patient been affected by domestic violence as an adult?: No  Child/Adolescent Assessment:     CCA Substance Use Alcohol/Drug Use: Alcohol / Drug Use Pain Medications: See MAR Prescriptions: See MAR Over the Counter: See MAR History of alcohol / drug use?: Yes Longest period of sobriety (when/how long): Unable to Assess Negative Consequences of Use: Personal relationships                         ASAM's:  Six Dimensions of Multidimensional Assessment  Dimension 1:  Acute Intoxication and/or Withdrawal Potential:      Dimension 2:  Biomedical Conditions and Complications:      Dimension 3:  Emotional, Behavioral, or Cognitive Conditions and Complications:  Dimension 3:  Description of emotional, behavioral, or cognitive conditions and complications: Pt has a schizoaffective diagnosis  Dimension 4:  Readiness to Change:     Dimension 5:  Relapse, Continued use, or Continued Problem Potential:     Dimension 6:  Recovery/Living Environment:     ASAM Severity Score:    ASAM Recommended Level of Treatment: ASAM Recommended Level of Treatment: Level I Outpatient Treatment   Substance use Disorder (SUD) Substance Use Disorder (SUD)  Checklist Symptoms of Substance Use: Continued use despite having a persistent/recurrent physical/psychological problem caused/exacerbated by use  Recommendations for Services/Supports/Treatments: Recommendations for Services/Supports/Treatments Recommendations For Services/Supports/Treatments: Individual Therapy  DSM5 Diagnoses: Patient Active Problem List   Diagnosis Date Noted   Marijuana use    Acute pulmonary embolism ([place]) 01/19/2019   Herpes 02/20/2017   Penile lesion 02/09/2017   Type 2 diabetes mellitus with hyperglycemia, without long-term current use of insulin ([place]) 02/09/2017   Hyperglycemia 10/23/2016   Schizoaffective disorder ([place]) 05/01/2016    Cannabis abuse 05/01/2016   Noncompliance 05/01/2016    Braeden Dolinski R Bernie Fobes, LCAS

## 2021-02-24 NOTE — ED Notes (Signed)
Psychiatry team at bedside
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
## 2021-02-24 NOTE — ED Provider Notes (Signed)
-----------------------------------------   1:08 PM on 02/24/2021 ----------------------------------------- Patient has been seen and evaluated by psychiatry.  They believe the patient is safe for discharge home from psychiatric standpoint.  Patient's medical work-up is been largely nonrevealing.  Patient will be discharged with outpatient resources.   Minna Antis, MD 02/24/21 1308

## 2021-02-24 NOTE — Discharge Instructions (Addendum)
You have been seen in the emergency department for a  psychiatric concern. You have been evaluated both medically as well as psychiatrically. Please follow-up with your outpatient resources provided. Return to the emergency department for any worsening symptoms, or any thoughts of hurting yourself or anyone else so that we may attempt to help you.  Follow up with [facility]

## 2021-03-07 ENCOUNTER — Emergency Department
Admission: EM | Admit: 2021-03-07 | Discharge: 2021-03-08 | Disposition: A | Discharge status: Home / Self Care | Payer: Medicare Other | Attending: Emergency Medicine | Admitting: Emergency Medicine

## 2021-03-07 ENCOUNTER — Emergency Department: Payer: Medicare Other

## 2021-03-07 ENCOUNTER — Other Ambulatory Visit: Payer: Self-pay

## 2021-03-07 ENCOUNTER — Encounter: Payer: Self-pay | Admitting: Emergency Medicine

## 2021-03-07 DIAGNOSIS — K047 Periapical abscess without sinus: Secondary | ICD-10-CM | POA: Insufficient documentation

## 2021-03-07 DIAGNOSIS — R0981 Nasal congestion: Secondary | ICD-10-CM | POA: Diagnosis present

## 2021-03-07 DIAGNOSIS — M791 Myalgia, unspecified site: Secondary | ICD-10-CM | POA: Diagnosis not present

## 2021-03-07 DIAGNOSIS — F17210 Nicotine dependence, cigarettes, uncomplicated: Secondary | ICD-10-CM | POA: Insufficient documentation

## 2021-03-07 DIAGNOSIS — Z20822 Contact with and (suspected) exposure to covid-19: Secondary | ICD-10-CM | POA: Diagnosis not present

## 2021-03-07 DIAGNOSIS — E119 Type 2 diabetes mellitus without complications: Secondary | ICD-10-CM | POA: Insufficient documentation

## 2021-03-07 DIAGNOSIS — J069 Acute upper respiratory infection, unspecified: Secondary | ICD-10-CM | POA: Diagnosis not present

## 2021-03-07 DIAGNOSIS — K05219 Aggressive periodontitis, localized, unspecified severity: Secondary | ICD-10-CM

## 2021-03-07 LAB — RESP PANEL BY RT-PCR (FLU A&B, COVID) ARPGX2
Influenza A by PCR: NEGATIVE
Influenza B by PCR: NEGATIVE
SARS Coronavirus 2 by RT PCR: NEGATIVE

## 2021-03-07 MED ORDER — LIDOCAINE HCL (PF) 1 % IJ SOLN
5.0000 mL | Freq: Once | INTRAMUSCULAR | Status: AC
  Administered 2021-03-08: 5 mL
  Filled 2021-03-07: qty 5

## 2021-03-07 MED ORDER — LIDOCAINE VISCOUS HCL 2 % MT SOLN
15.0000 mL | Freq: Once | OROMUCOSAL | Status: AC
  Administered 2021-03-08: 15 mL via OROMUCOSAL
  Filled 2021-03-07: qty 15

## 2021-03-07 NOTE — ED Triage Notes (Signed)
Pt homeless, BIB [facility] for flu-like symptoms x 1 wk. Also c/o pain to R lower gum area, has abcess.

## 2021-03-08 DIAGNOSIS — J069 Acute upper respiratory infection, unspecified: Secondary | ICD-10-CM | POA: Diagnosis not present

## 2021-03-08 LAB — GROUP A STREP BY PCR: Group A Strep by PCR: NOT DETECTED

## 2021-03-08 MED ORDER — ACETAMINOPHEN 500 MG PO TABS
1000.0000 mg | ORAL_TABLET | Freq: Once | ORAL | Status: AC
  Administered 2021-03-08: 1000 mg via ORAL
  Filled 2021-03-08: qty 2

## 2021-03-08 MED ORDER — CHLORHEXIDINE GLUCONATE 0.12 % MT SOLN
15.0000 mL | Freq: Once | OROMUCOSAL | Status: AC
  Administered 2021-03-08: 15 mL via OROMUCOSAL

## 2021-03-08 MED ORDER — AMOXICILLIN-POT CLAVULANATE 875-125 MG PO TABS: 1.0000 | ORAL_TABLET | Freq: Two times a day (BID) | ORAL | 0 refills | Status: AC

## 2021-03-08 MED ORDER — CHLORHEXIDINE GLUCONATE 0.12 % MT SOLN: 15.0000 mL | Freq: Two times a day (BID) | OROMUCOSAL | 0 refills | Status: DC

## 2021-03-08 MED ORDER — AMOXICILLIN-POT CLAVULANATE 875-125 MG PO TABS
1.0000 | ORAL_TABLET | Freq: Once | ORAL | Status: AC
  Administered 2021-03-08: 1 via ORAL
  Filled 2021-03-08: qty 1

## 2021-03-08 MED ORDER — IBUPROFEN 800 MG PO TABS: 800.0000 mg | ORAL_TABLET | Freq: Once | ORAL | Status: DC

## 2021-03-08 NOTE — Discharge Instructions (Addendum)
Use chlorhexidine twice a day over the site of abscess. Take the antibiotics as prescribed.  Return to the emergency room for new or worsening swelling of your gum, pain or swelling of the area underneath your tongue or your neck, difficulty opening your mouth, difficulty swallowing, fever, chest pain or shortness of breath.

## 2021-03-08 NOTE — ED Provider Notes (Signed)
[facility] Emergency Department Provider Note  ____________________________________________  Time seen: Approximately 1:14 AM  I have reviewed the triage vital signs and the nursing notes.   HISTORY  Chief Complaint Nasal Congestion and Gum Abcess   HPI Albert Miranda is a 40 y.o. male with a history of bipolar disorder, homelessness, diabetes who presents for evaluation of 2 different complaints.  Patient reports a week of nasal congestion, cough, and sore throat.  Denies any chest pain or shortness of breath, no abdominal pain.  Unknown if he has had fever.  Does complain of some body aches.  He is also complaining of a gum abscess that he is noted over the last couple of days.  He is complaining of mild constant throbbing pain.  Past Medical History:  Diagnosis Date   Bipolar 1 disorder (HCC)    Diabetes mellitus without complication (HCC)    A1C 10.1 01/2017    Herpes    Hyperglycemia    Patient denies medical problems     Patient Active Problem List   Diagnosis Date Noted   Marijuana use    Acute pulmonary embolism (HCC) 01/19/2019   Herpes 02/20/2017   Penile lesion 02/09/2017   Type 2 diabetes mellitus with hyperglycemia, without long-term current use of insulin (HCC) 02/09/2017   Hyperglycemia 10/23/2016   Schizoaffective disorder (HCC) 05/01/2016   Cannabis abuse 05/01/2016   Noncompliance 05/01/2016    Past Surgical History:  Procedure Laterality Date   INCISION AND DRAINAGE ABSCESS     buttock gluteal region    LEG SURGERY     PULMONARY THROMBECTOMY N/A 01/20/2019   Procedure: PULMONARY THROMBECTOMY, LEFT LOWER EXTREMITY VENOUS LYSIS, IVF FILTER INSERTION;  Surgeon: Annice Needy, MD;  Location: [facility] INVASIVE CV LAB;  Service: Cardiovascular;  Laterality: N/A;    Prior to Admission medications   Medication Sig Start Date End Date Taking? Authorizing Provider  amoxicillin-clavulanate (AUGMENTIN) 875-125 MG tablet Take 1 tablet  by mouth 2 (two) times daily for 10 days. 03/08/21 03/18/21 Yes Deziree Mokry, [place], MD  chlorhexidine (PERIDEX) 0.12 % solution Use as directed 15 mLs in the mouth or throat 2 (two) times daily. 03/08/21  Yes Don Perking, [place], MD  ARIPiprazole (ABILIFY) 10 MG tablet Take 1 tablet (10 mg total) by mouth daily. 01/04/21   Clapacs, Jackquline [place], MD  ARIPiprazole ER (ABILIFY MAINTENA) 400 MG SRER injection Inject 2 mLs (400 mg total) into the muscle every 28 (twenty-eight) days. 01/31/21   Clapacs, Jackquline [place], MD  lamoTRIgine (LAMICTAL) 25 MG tablet Take 1 tablet (25 mg total) by mouth daily. 01/04/21   Clapacs, Jackquline [place], MD    Allergies Patient has no known allergies.  Family History  Problem Relation Age of Onset   Diabetes Father        dx age 28    Depression Mother     Social History Social History   Tobacco Use   Smoking status: Every Day    Packs/day: 1.00    Types: Cigarettes   Smokeless tobacco: Never   Tobacco comments:    1-1.5 ppd since since age 51 as of 02/07/17 does not want to quit  Vaping Use   Vaping Use: Never used  Substance Use Topics   Alcohol use: Yes    Comment: occassional   Drug use: Yes    Types: Marijuana    Comment: 1-2 x per month     Review of Systems  Constitutional: Negative for fever. Eyes: Negative for  visual changes. ENT: + sore throat, congestion, gum abcess Neck: No neck pain  Cardiovascular: Negative for chest pain. Respiratory: Negative for shortness of breath. + cough Gastrointestinal: Negative for abdominal pain, vomiting or diarrhea. Genitourinary: Negative for dysuria. Musculoskeletal: Negative for back pain. Skin: Negative for rash. Neurological: Negative for headaches, weakness or numbness. Psych: No SI or HI  ____________________________________________   PHYSICAL EXAM:  VITAL SIGNS: ED Triage Vitals  Enc Vitals Group     BP 03/07/21 2213 (!) 129/92     Pulse Rate 03/07/21 2213 72     Resp 03/07/21 2213 18     Temp  03/07/21 2213 99 F (37.2 C)     Temp Source 03/07/21 2213 Oral     SpO2 03/07/21 2213 99 %     Weight 03/07/21 2214 205 lb (93 kg)     Height --      Head Circumference --      Peak Flow --      Pain Score --      Pain Loc --      Pain Edu? --      Excl. in GC? --     Constitutional: Alert and oriented. Well appearing and in no apparent distress. HEENT:      Head: Normocephalic and atraumatic.         Eyes: Conjunctivae are normal. Sclera is non-icteric.       Mouth/Throat: Mucous membranes are moist.  Oropharynx is clear with no exudates, no tonsillar hypertrophy or erythema, floor of mouth soft, no trismus.  There is a small half a centimeter abscess located at the base of the right lower molar      Neck: Supple with no signs of meningismus. Cardiovascular: Regular rate and rhythm. No murmurs, gallops, or rubs. 2+ symmetrical distal pulses are present in all extremities. No JVD. Respiratory: Normal respiratory effort. Lungs are clear to auscultation bilaterally.  Gastrointestinal: Soft, non tender, and non distended with positive bowel sounds. No rebound or guarding. Genitourinary: No CVA tenderness. Musculoskeletal:  No edema, cyanosis, or erythema of extremities. Neurologic: Normal speech and language. Face is symmetric. Moving all extremities. No gross focal neurologic deficits are appreciated. Skin: Skin is warm, dry and intact. No rash noted. Psychiatric: Mood and affect are normal. Speech and behavior are normal.  ____________________________________________   LABS (all labs ordered are listed, but only abnormal results are displayed)  Labs Reviewed  RESP PANEL BY RT-PCR (FLU A&B, COVID) ARPGX2  GROUP A STREP BY PCR   ____________________________________________  EKG  none  ____________________________________________  RADIOLOGY  I have personally reviewed the images performed during this visit and I agree with the Radiologist's read.   Interpretation by  Radiologist:  DG Chest 2 View  Result Date: 03/07/2021 CLINICAL DATA:  Cough EXAM: CHEST - 2 VIEW COMPARISON:  01/26/2021 FINDINGS: Heart and mediastinal contours are within normal limits. No focal opacities or effusions. No acute bony abnormality. IMPRESSION: No active cardiopulmonary disease. Electronically Signed   By: Charlett Nose M.D.   On: 03/07/2021 23:44     ____________________________________________   PROCEDURES  Procedure(s) performed:yes .Marland KitchenIncision and Drainage  Date/Time: 03/08/2021 1:17 AM Performed by: Nita Sickle, MD Authorized by: Nita Sickle, MD   Consent:    Consent obtained:  Verbal   Consent given by:  Patient   Risks discussed:  Bleeding, infection, incomplete drainage and pain   Alternatives discussed:  Alternative treatment, delayed treatment and observation Universal protocol:    Patient identity confirmed:  Verbally with patient Location:    Type:  Abscess   Size:  0.5   Location:  Mouth   Mouth location:  Alveolar process Pre-procedure details:    Skin preparation:  Chlorhexidine Anesthesia:    Anesthesia method:  Local infiltration   Local anesthetic:  Lidocaine 1% w/o epi Procedure type:    Complexity:  Complex Procedure details:    Incision types:  Stab incision   Drainage:  Bloody   Drainage amount:  Moderate   Wound treatment:  Wound left open   Packing materials:  None Post-procedure details:    Procedure completion:  Tolerated well, no immediate complications   Critical Care performed:  None ____________________________________________   INITIAL IMPRESSION / ASSESSMENT AND PLAN / ED COURSE  40 y.o. male with a history of bipolar disorder, homelessness, diabetes who presents for evaluation of 2 different complaints.  #1 week of mild cough, congestion and sore throat.  Patient has no chest pain or shortness of breath.  Normal work of breathing normal sats, lungs are clear to auscultation.  Vitals are within normal  limits.  Chest x-ray with no signs of pneumonia.  Flu, COVID, strep negative.  Most likely viral syndrome  #gum abscess: Drained per procedure note above.  Patient put on a short course of Augmentin and topical chlorhexidine.  Discussed wound care and follow-up with dentist.  Discussed my standard return precautions for worsening infection, difficulty opening his mouth, drooling, redness or induration of the floor of the mouth/neck.  No signs of Ludewig's angina.      _____________________________________________ Please note:  Patient was evaluated in Emergency Department today for the symptoms described in the history of present illness. Patient was evaluated in the context of the global COVID-19 pandemic, which necessitated consideration that the patient might be at risk for infection with the SARS-CoV-2 virus that causes COVID-19. Institutional protocols and algorithms that pertain to the evaluation of patients at risk for COVID-19 are in a state of rapid change based on information released by regulatory bodies including the CDC and federal and state organizations. These policies and algorithms were followed during the patient's care in the ED.  Some ED evaluations and interventions may be delayed as a result of limited staffing during the pandemic.   [place] Controlled Substance Database was reviewed by me. ____________________________________________   FINAL CLINICAL IMPRESSION(S) / ED DIAGNOSES   Final diagnoses:  Viral URI with cough  Gum abscess      NEW MEDICATIONS STARTED DURING THIS VISIT:  ED Discharge Orders          Ordered    amoxicillin-clavulanate (AUGMENTIN) 875-125 MG tablet  2 times daily        03/08/21 0120    chlorhexidine (PERIDEX) 0.12 % solution  2 times daily        03/08/21 0120             Note:  This document was prepared using Dragon voice recognition software and may include unintentional dictation errors.    Nita Sickle, MD 03/08/21  [phone]

## 2021-03-11 ENCOUNTER — Encounter: Payer: Self-pay | Admitting: Emergency Medicine

## 2021-03-11 ENCOUNTER — Other Ambulatory Visit: Payer: Self-pay

## 2021-03-11 ENCOUNTER — Emergency Department
Admission: EM | Admit: 2021-03-11 | Discharge: 2021-03-11 | Disposition: A | Discharge status: Home / Self Care | Payer: Medicare Other | Attending: Emergency Medicine | Admitting: Emergency Medicine

## 2021-03-11 DIAGNOSIS — S00511A Abrasion of lip, initial encounter: Secondary | ICD-10-CM | POA: Insufficient documentation

## 2021-03-11 DIAGNOSIS — F17210 Nicotine dependence, cigarettes, uncomplicated: Secondary | ICD-10-CM | POA: Insufficient documentation

## 2021-03-11 DIAGNOSIS — E119 Type 2 diabetes mellitus without complications: Secondary | ICD-10-CM | POA: Diagnosis not present

## 2021-03-11 MED ORDER — CHLORHEXIDINE GLUCONATE 0.12 % MT SOLN
15.0000 mL | Freq: Once | OROMUCOSAL | Status: AC
  Administered 2021-03-11: 06:00:00 15 mL via OROMUCOSAL

## 2021-03-11 MED ORDER — CHLORHEXIDINE GLUCONATE 0.12 % MT SOLN: 15.0000 mL | Freq: Two times a day (BID) | OROMUCOSAL | 0 refills | Status: DC

## 2021-03-11 MED ORDER — ACETAMINOPHEN 500 MG PO TABS
1000.0000 mg | ORAL_TABLET | Freq: Once | ORAL | Status: AC
  Administered 2021-03-11: 05:00:00 1000 mg via ORAL
  Filled 2021-03-11: qty 2
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
## 2021-03-12 ENCOUNTER — Other Ambulatory Visit: Payer: Self-pay

## 2021-03-12 DIAGNOSIS — Z5321 Procedure and treatment not carried out due to patient leaving prior to being seen by health care provider: Secondary | ICD-10-CM | POA: Diagnosis not present

## 2021-03-12 DIAGNOSIS — Z760 Encounter for issue of repeat prescription: Secondary | ICD-10-CM | POA: Diagnosis present

## 2021-03-12 NOTE — ED Triage Notes (Signed)
Pt states he is out of Lamictal clonopin and Abilify for the past month and is feeling anxious.

## 2021-03-13 ENCOUNTER — Emergency Department
Admission: EM | Admit: 2021-03-13 | Discharge: 2021-03-13 | Disposition: A | Discharge status: Home / Self Care | Payer: Medicare Other | Attending: Emergency Medicine | Admitting: Emergency Medicine

## 2021-03-13 LAB — BASIC METABOLIC PANEL
Anion gap: 5 (ref 5–15)
BUN: 18 mg/dL (ref 6–20)
CO2: 29 mmol/L (ref 22–32)
Calcium: 9 mg/dL (ref 8.9–10.3)
Chloride: 102 mmol/L (ref 98–111)
Creatinine, Ser: 0.97 mg/dL (ref 0.61–1.24)
GFR, Estimated: >60 mL/min (ref >60)
Glucose, Bld: 239 mg/dL — ABNORMAL HIGH (ref 70–99)
Potassium: 3.9 mmol/L (ref 3.5–5.1)
Sodium: 136 mmol/L (ref 135–145)

## 2021-03-13 LAB — CBC WITH DIFFERENTIAL/PLATELET
Abs Immature Granulocytes: 0.03 10*3/uL (ref 0.00–0.07)
Basophils Absolute: 0 10*3/uL (ref 0.0–0.1)
Basophils Relative: 0 %
Eosinophils Absolute: 0.2 10*3/uL (ref 0.0–0.5)
Eosinophils Relative: 2 %
HCT: 45.8 % (ref 39.0–52.0)
Hemoglobin: 15.6 g/dL (ref 13.0–17.0)
Immature Granulocytes: 0 %
Lymphocytes Relative: 26 %
Lymphs Abs: 2.3 10*3/uL (ref 0.7–4.0)
MCH: 31.5 pg (ref 26.0–34.0)
MCHC: 34.1 g/dL (ref 30.0–36.0)
MCV: 92.3 fL (ref 80.0–100.0)
Monocytes Absolute: 0.6 10*3/uL (ref 0.1–1.0)
Monocytes Relative: 7 %
Neutro Abs: 5.6 10*3/uL (ref 1.7–7.7)
Neutrophils Relative %: 65 %
Platelets: 260 10*3/uL (ref 150–400)
RBC: 4.96 MIL/uL (ref 4.22–5.81)
RDW: 13 % (ref 11.5–15.5)
WBC: 8.8 10*3/uL (ref 4.0–10.5)
nRBC: 0 % (ref 0.0–0.2)

## 2022-01-22 ENCOUNTER — Ambulatory Visit (HOSPITAL_COMMUNITY)
Admission: EM | Admit: 2022-01-22 | Discharge: 2022-01-23 | Disposition: A | Discharge status: Home / Self Care | Payer: Medicare Other | Attending: Registered Nurse | Admitting: Registered Nurse

## 2022-01-22 ENCOUNTER — Other Ambulatory Visit: Payer: Self-pay

## 2022-01-22 ENCOUNTER — Encounter (HOSPITAL_COMMUNITY): Payer: Self-pay | Admitting: Registered Nurse

## 2022-01-22 DIAGNOSIS — W2209XA Striking against other stationary object, initial encounter: Secondary | ICD-10-CM | POA: Insufficient documentation

## 2022-01-22 DIAGNOSIS — F1290 Cannabis use, unspecified, uncomplicated: Secondary | ICD-10-CM | POA: Insufficient documentation

## 2022-01-22 DIAGNOSIS — Z1152 Encounter for screening for COVID-19: Secondary | ICD-10-CM

## 2022-01-22 DIAGNOSIS — F259 Schizoaffective disorder, unspecified: Secondary | ICD-10-CM | POA: Diagnosis present

## 2022-01-22 DIAGNOSIS — W230XXA Caught, crushed, jammed, or pinched between moving objects, initial encounter: Secondary | ICD-10-CM | POA: Diagnosis not present

## 2022-01-22 DIAGNOSIS — R7989 Other specified abnormal findings of blood chemistry: Secondary | ICD-10-CM | POA: Insufficient documentation

## 2022-01-22 DIAGNOSIS — F17210 Nicotine dependence, cigarettes, uncomplicated: Secondary | ICD-10-CM | POA: Diagnosis not present

## 2022-01-22 DIAGNOSIS — E7800 Pure hypercholesterolemia, unspecified: Secondary | ICD-10-CM | POA: Insufficient documentation

## 2022-01-22 DIAGNOSIS — R451 Restlessness and agitation: Secondary | ICD-10-CM | POA: Insufficient documentation

## 2022-01-22 DIAGNOSIS — Z046 Encounter for general psychiatric examination, requested by authority: Secondary | ICD-10-CM

## 2022-01-22 DIAGNOSIS — F250 Schizoaffective disorder, bipolar type: Secondary | ICD-10-CM | POA: Insufficient documentation

## 2022-01-22 LAB — POCT URINE DRUG SCREEN - MANUAL ENTRY (I-SCREEN)
POC Amphetamine UR: NOT DETECTED
POC Buprenorphine (BUP): NOT DETECTED
POC Cocaine UR: NOT DETECTED
POC Marijuana UR: NOT DETECTED
POC Methadone UR: NOT DETECTED
POC Methamphetamine UR: NOT DETECTED
POC Morphine: NOT DETECTED
POC Oxazepam (BZO): NOT DETECTED
POC Oxycodone UR: NOT DETECTED
POC Secobarbital (BAR): NOT DETECTED

## 2022-01-22 LAB — URINALYSIS, ROUTINE W REFLEX MICROSCOPIC
Bacteria, UA: NONE SEEN
Bilirubin Urine: NEGATIVE
Glucose, UA: >=500 mg/dL — AB
Hgb urine dipstick: NEGATIVE
Ketones, ur: NEGATIVE mg/dL
Leukocytes,Ua: NEGATIVE
Nitrite: NEGATIVE
Protein, ur: NEGATIVE mg/dL
Specific Gravity, Urine: 1.036 — ABNORMAL HIGH (ref 1.005–1.030)
pH: 5 (ref 5.0–8.0)

## 2022-01-22 LAB — MAGNESIUM: Magnesium: 2 mg/dL (ref 1.7–2.4)

## 2022-01-22 LAB — CBC WITH DIFFERENTIAL/PLATELET
Abs Immature Granulocytes: 0.02 10*3/uL (ref 0.00–0.07)
Basophils Absolute: 0 10*3/uL (ref 0.0–0.1)
Basophils Relative: 0 %
Eosinophils Absolute: 0.1 10*3/uL (ref 0.0–0.5)
Eosinophils Relative: 2 %
HCT: 45.9 % (ref 39.0–52.0)
Hemoglobin: 16 g/dL (ref 13.0–17.0)
Immature Granulocytes: 0 %
Lymphocytes Relative: 33 %
Lymphs Abs: 2.5 10*3/uL (ref 0.7–4.0)
MCH: 31.4 pg (ref 26.0–34.0)
MCHC: 34.9 g/dL (ref 30.0–36.0)
MCV: 90.2 fL (ref 80.0–100.0)
Monocytes Absolute: 0.4 10*3/uL (ref 0.1–1.0)
Monocytes Relative: 6 %
Neutro Abs: 4.3 10*3/uL (ref 1.7–7.7)
Neutrophils Relative %: 59 %
Platelets: 247 10*3/uL (ref 150–400)
RBC: 5.09 MIL/uL (ref 4.22–5.81)
RDW: 13.9 % (ref 11.5–15.5)
WBC: 7.3 10*3/uL (ref 4.0–10.5)
nRBC: 0 % (ref 0.0–0.2)

## 2022-01-22 LAB — COMPREHENSIVE METABOLIC PANEL
ALT: 17 U/L (ref 0–44)
AST: 18 U/L (ref 15–41)
Albumin: 3.9 g/dL (ref 3.5–5.0)
Alkaline Phosphatase: 50 U/L (ref 38–126)
Anion gap: 15 (ref 5–15)
BUN: 13 mg/dL (ref 6–20)
CO2: 23 mmol/L (ref 22–32)
Calcium: 9.6 mg/dL (ref 8.9–10.3)
Chloride: 95 mmol/L — ABNORMAL LOW (ref 98–111)
Creatinine, Ser: 0.98 mg/dL (ref 0.61–1.24)
GFR, Estimated: >60 mL/min (ref >60)
Glucose, Bld: 461 mg/dL — ABNORMAL HIGH (ref 70–99)
Potassium: 4.4 mmol/L (ref 3.5–5.1)
Sodium: 133 mmol/L — ABNORMAL LOW (ref 135–145)
Total Bilirubin: 0.3 mg/dL (ref 0.3–1.2)
Total Protein: 6.9 g/dL (ref 6.5–8.1)

## 2022-01-22 LAB — ETHANOL: Alcohol, Ethyl (B): <10 mg/dL (ref <10)

## 2022-01-22 LAB — LIPID PANEL
Cholesterol: 193 mg/dL (ref 0–200)
HDL: 44 mg/dL (ref >40)
LDL Cholesterol: 117 mg/dL — ABNORMAL HIGH (ref 0–99)
Total CHOL/HDL Ratio: 4.4 RATIO
Triglycerides: 158 mg/dL — ABNORMAL HIGH (ref <150)
VLDL: 32 mg/dL (ref 0–40)

## 2022-01-22 LAB — HEMOGLOBIN A1C
Hgb A1c MFr Bld: 8.4 % — ABNORMAL HIGH (ref 4.8–5.6)
Mean Plasma Glucose: 194.38 mg/dL

## 2022-01-22 LAB — POC SARS CORONAVIRUS 2 AG: SARSCOV2ONAVIRUS 2 AG: NEGATIVE

## 2022-01-22 LAB — TSH: TSH: 0.882 u[IU]/mL (ref 0.350–4.500)

## 2022-01-22 MED ORDER — TRAZODONE HCL 50 MG PO TABS
50.0000 mg | ORAL_TABLET | Freq: Every evening | ORAL | Status: DC | PRN
  Administered 2022-01-22: 50 mg via ORAL
  Filled 2022-01-22: qty 1

## 2022-01-22 MED ORDER — ACETAMINOPHEN 325 MG PO TABS: 650.0000 mg | ORAL_TABLET | Freq: Four times a day (QID) | ORAL | Status: DC | PRN

## 2022-01-22 MED ORDER — HYDROXYZINE HCL 25 MG PO TABS
25.0000 mg | ORAL_TABLET | Freq: Three times a day (TID) | ORAL | Status: DC | PRN
  Administered 2022-01-22: 25 mg via ORAL
  Filled 2022-01-22: qty 1

## 2022-01-22 MED ORDER — MAGNESIUM HYDROXIDE 400 MG/5ML PO SUSP: 30.0000 mL | Freq: Every day | ORAL | Status: DC | PRN

## 2022-01-22 MED ORDER — ALUM & MAG HYDROXIDE-SIMETH 200-200-20 MG/5ML PO SUSP: 30.0000 mL | ORAL | Status: DC | PRN

## 2022-01-22 MED ORDER — ARIPIPRAZOLE 10 MG PO TABS
10.0000 mg | ORAL_TABLET | Freq: Every day | ORAL | Status: DC
  Administered 2022-01-22 – 2022-01-23 (×2): 10 mg via ORAL
  Filled 2022-01-22 (×2): qty 1
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
## 2022-01-22 NOTE — ED Notes (Signed)
Pt is sleeping. No distress noted. Will continue to monitor safety. 
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
## 2022-01-23 ENCOUNTER — Encounter (HOSPITAL_COMMUNITY): Payer: Self-pay | Admitting: Emergency Medicine

## 2022-01-23 DIAGNOSIS — Z046 Encounter for general psychiatric examination, requested by authority: Secondary | ICD-10-CM | POA: Diagnosis not present

## 2022-01-23 DIAGNOSIS — F250 Schizoaffective disorder, bipolar type: Secondary | ICD-10-CM | POA: Diagnosis not present

## 2022-01-23 MED ORDER — LAMOTRIGINE 25 MG PO TABS
25.0000 mg | ORAL_TABLET | Freq: Every day | ORAL | Status: DC
  Administered 2022-01-23: 25 mg via ORAL
  Filled 2022-01-23: qty 1

## 2022-01-23 MED ORDER — HYDROXYZINE HCL 25 MG PO TABS: 25.0000 mg | ORAL_TABLET | Freq: Three times a day (TID) | ORAL | 0 refills | Status: AC | PRN

## 2022-01-23 MED ORDER — LAMOTRIGINE 25 MG PO TABS: 25.0000 mg | ORAL_TABLET | Freq: Every day | ORAL | 0 refills | Status: AC

## 2022-01-23 MED ORDER — TRAZODONE HCL 50 MG PO TABS: 50.0000 mg | ORAL_TABLET | Freq: Every evening | ORAL | 0 refills | Status: AC | PRN

## 2022-01-23 MED ORDER — ARIPIPRAZOLE 10 MG PO TABS: 10.0000 mg | ORAL_TABLET | Freq: Every day | ORAL | 0 refills | Status: AC
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
## 2022-01-23 NOTE — ED Notes (Signed)
Pt given a snack.  Is awake and sitting up in the bed. No distress noted.  

## 2022-01-23 NOTE — ED Notes (Signed)
Pt resting at this time.  Breathing even and unlabored.

## 2022-01-23 NOTE — ED Notes (Signed)
Pt's IVC paper work was filled out and Rescinded by Dr. Clotilde Dieter.  Pt was given copy of AVS along with location of his called in prescriptions.  Pt verbalized understanding.  Pt also was given a bus pass and reports that he knows how to get home.  He denies SI, HI or AVH at time of discharge.

## 2022-01-23 NOTE — Discharge Instructions (Addendum)
Follow-up recommendations:  Activity:  Normal, as tolerated Diet:  Per PCP recommendation  Patient is instructed prior to discharge to: Take all medications as prescribed by his mental healthcare provider. Report any adverse effects and/or reactions from the medicines to his outpatient provider promptly. Patient has been instructed & cautioned: To not engage in alcohol and or illegal drug use while on prescription medicines.  In the event of worsening symptoms, patient is instructed to call the crisis hotline at 988, 911 and or go to the nearest ED for appropriate evaluation and treatment of symptoms. To follow-up with his primary care provider for your other medical issues, concerns and or health care needs.  

## 2022-01-23 NOTE — Inpatient Diabetes Management (Signed)
Inpatient Diabetes Program Recommendations  AACE/ADA: New Consensus Statement on Inpatient Glycemic Control (2015)  Target Ranges:  Prepandial:   less than 140 mg/dL      Peak postprandial:   less than 180 mg/dL (1-2 hours)      Critically ill patients:  140 - 180 mg/dL   Lab Results  Component Value Date   GLUCAP 125 (H) 10/14/2020   HGBA1C 8.4 (H) 01/22/2022    Review of Glycemic Control  Latest Reference Range & Units 01/22/22 19:40  Glucose 70 - 99 mg/dL 461 (H)   Diabetes history: DM 2 Outpatient Diabetes medications: Metformin 1000 mg bid Current orders for Inpatient glycemic control:  None  A1c 8.4% on 10/31  Inpatient Diabetes Program Recommendations:    -  Start Metformin 1000 mg bid -  Start CBGs and Novolog 0-15 units tid + hs  Thanks,  Tama Headings RN, MSN, BC-ADM Inpatient Diabetes Coordinator Team Pager [phone] (8a-5p)

## 2022-01-23 NOTE — ED Notes (Signed)
Pt is awake and alert. He is pacing but redirectable with no indication of RIS.  Staff will continue to monitor for safety.

## 2022-01-23 NOTE — ED Notes (Signed)
Pt sleeping at present, no distress noted.  Monitoring for safety. 

## 2022-01-23 NOTE — ED Notes (Signed)
Pt is awake and alert. He has blunted affect and depressed mood. Pt has good eye contact and watching tv.  Denies si,hi or avh.  Staff will continue to monitor for safety.

## 2022-01-24 LAB — PROLACTIN: Prolactin: 7.9 ng/mL (ref 4.0–15.2)

## 2022-04-24 IMAGING — CR DG CHEST 2V
1 series · 2 of 2 positions shown · non-contrast
Comparison: None.

CLINICAL DATA: Chest pain

EXAM:
CHEST - 2 VIEW

[Series 1: dg chest 2 view · 0.14mm/px · 2 of 2 slices shown]
[im 1/2]
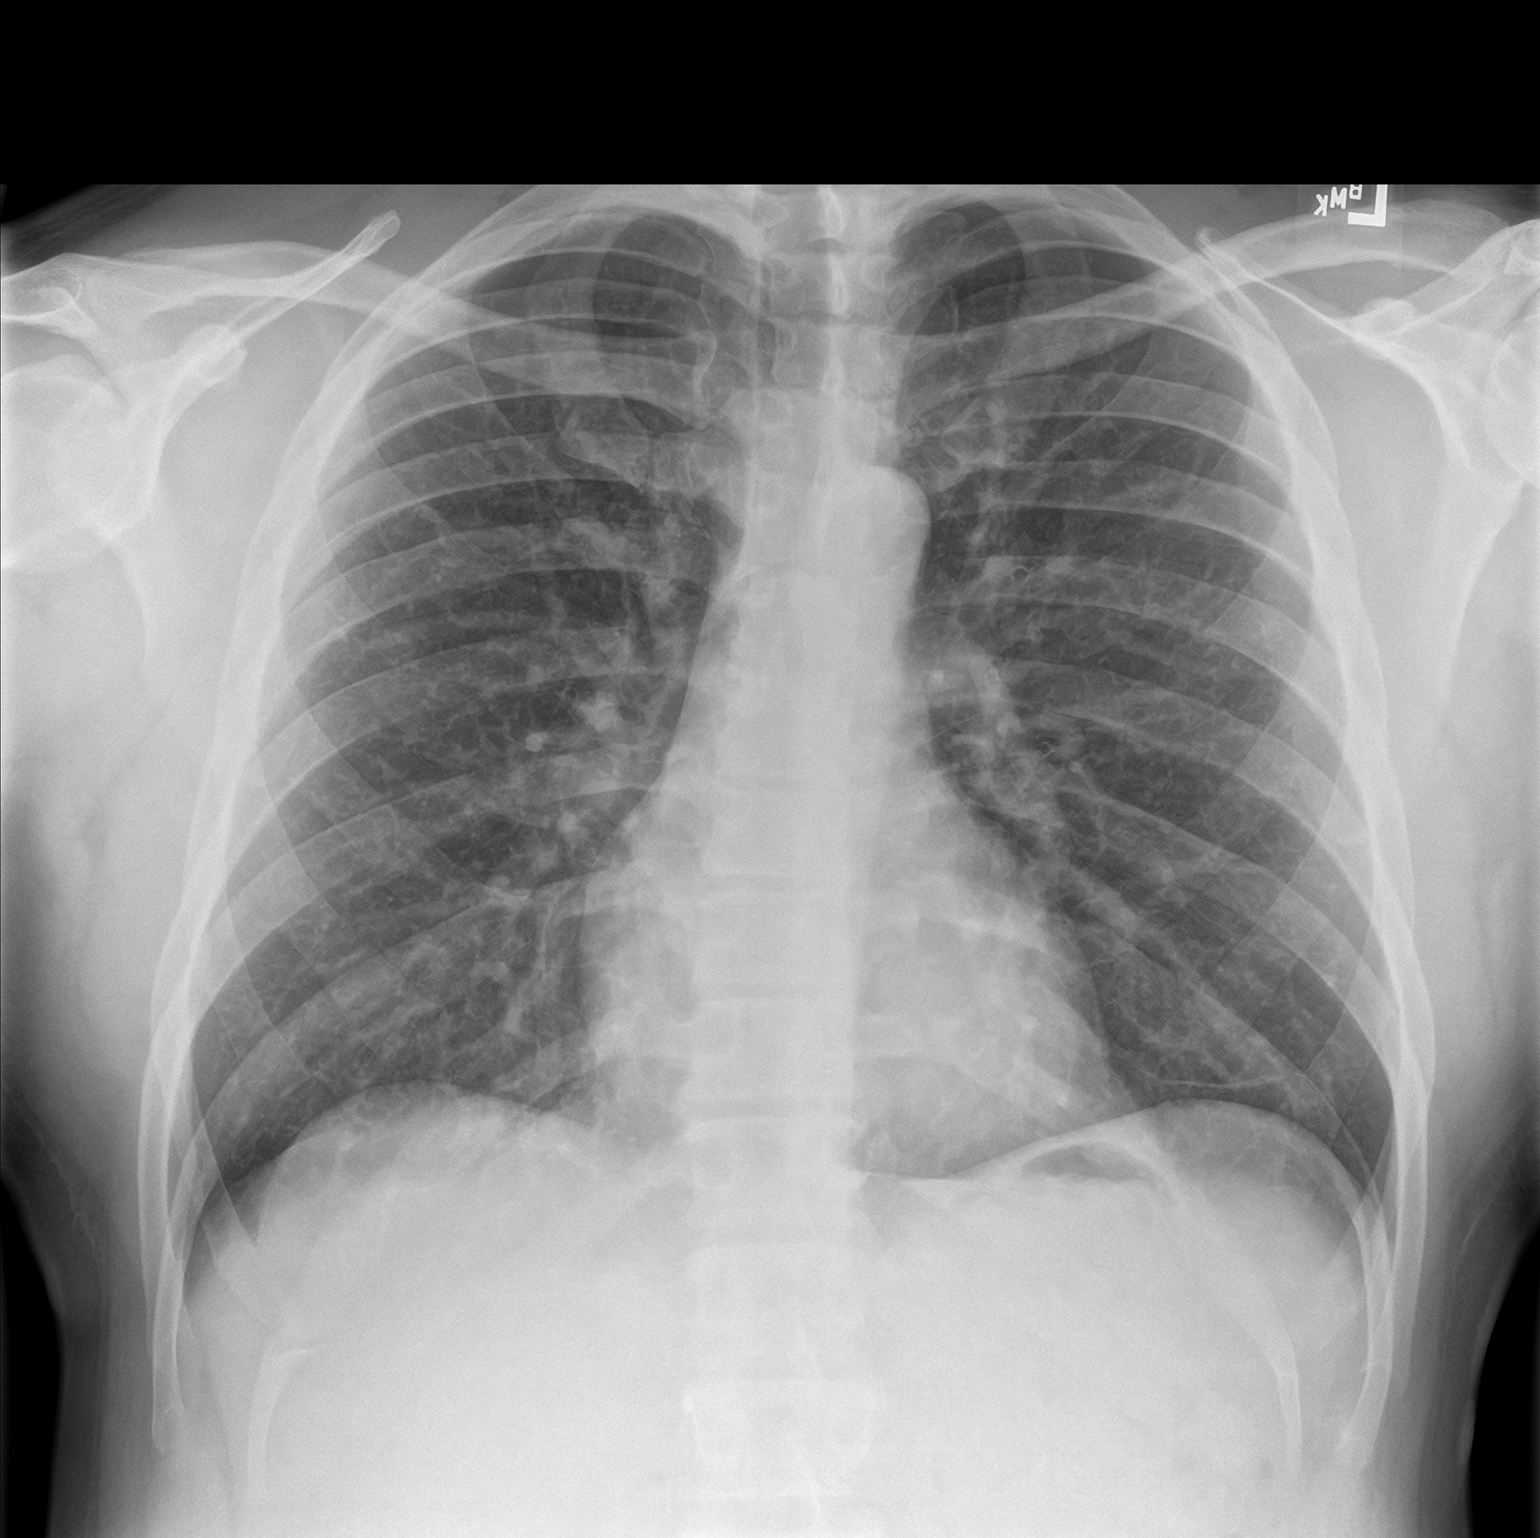
[im 2/2]
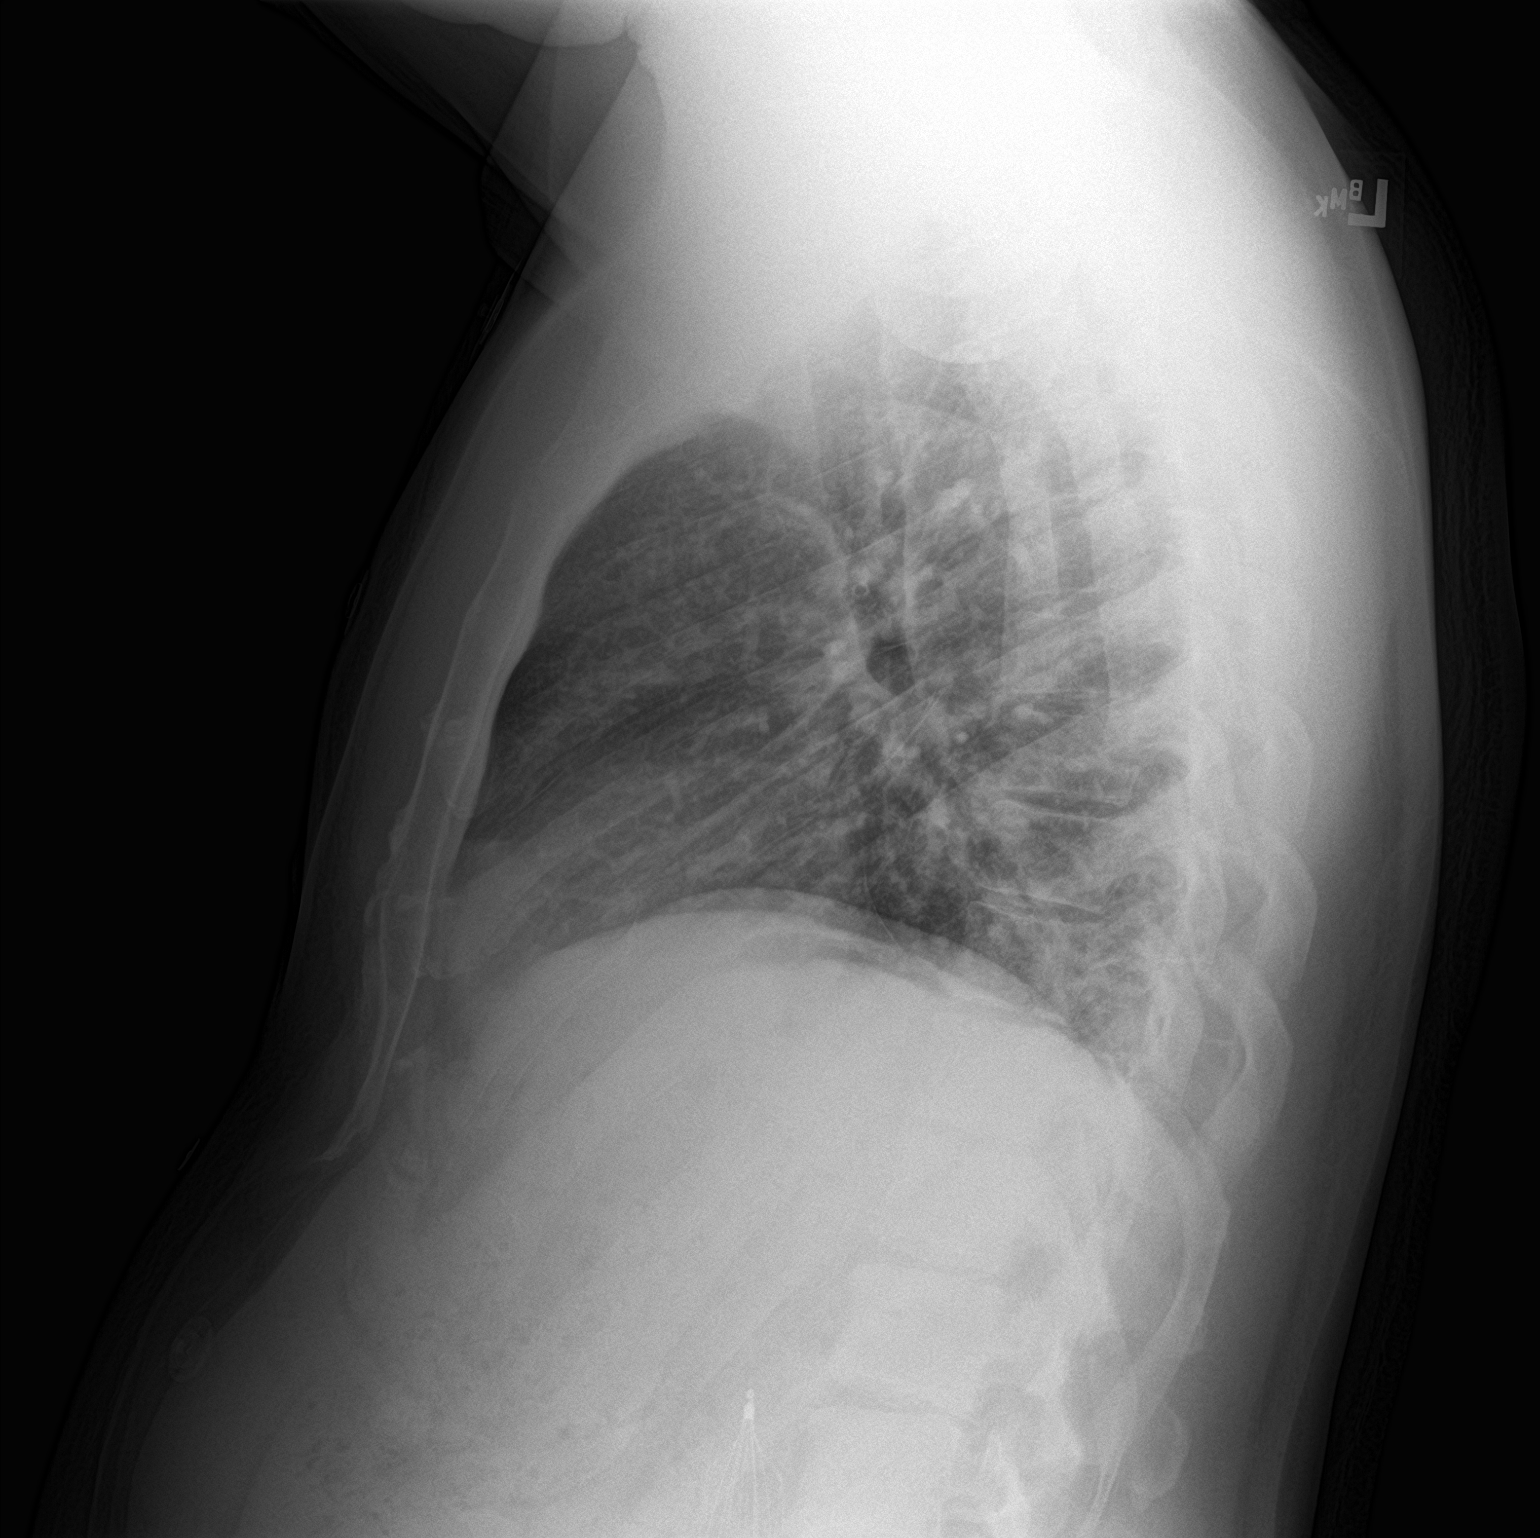

[2 of 2 positions shown; findings below may reference images not displayed]

FINDINGS: There is increased opacity at the retrocardiac left lung base, most
evident on the lateral projection. No pleural effusion or
pneumothorax. No pulmonary edema. Cardiomediastinal contours are
normal.
IMPRESSION: Retrocardiac left lung base opacity may indicate atelectasis or
developing consolidation.
